# Patient Record
Sex: Female | Born: 1980 | Race: White | Hispanic: No | Marital: Married | Smoking: Former smoker
Health system: Southern US, Community
[De-identification: ages and names within clinical notes are randomized; demographics above are authoritative.]

## PROBLEM LIST (undated history)

## (undated) DIAGNOSIS — A599 Trichomoniasis, unspecified: Secondary | ICD-10-CM

## (undated) DIAGNOSIS — E119 Type 2 diabetes mellitus without complications: Secondary | ICD-10-CM

## (undated) DIAGNOSIS — E669 Obesity, unspecified: Secondary | ICD-10-CM

## (undated) HISTORY — PX: CHOLECYSTECTOMY: SHX55

## (undated) HISTORY — DX: Trichomoniasis, unspecified: A59.9

---
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
## 2001-02-20 ENCOUNTER — Other Ambulatory Visit: Admission: RE | Admit: 2001-02-20 | Discharge: 2001-02-20 | Payer: Self-pay | Admitting: Family Medicine

## 2001-02-21 ENCOUNTER — Other Ambulatory Visit: Admission: RE | Admit: 2001-02-21 | Discharge: 2001-02-21 | Payer: Self-pay | Admitting: Family Medicine

## 2001-12-08 ENCOUNTER — Emergency Department (HOSPITAL_COMMUNITY): Admission: EM | Admit: 2001-12-08 | Discharge: 2001-12-09 | Payer: Self-pay | Admitting: Emergency Medicine

## 2001-12-09 ENCOUNTER — Encounter: Payer: Self-pay | Admitting: Emergency Medicine

## 2002-06-01 ENCOUNTER — Emergency Department (HOSPITAL_COMMUNITY): Admission: EM | Admit: 2002-06-01 | Discharge: 2002-06-02 | Payer: Self-pay | Admitting: Emergency Medicine

## 2006-07-18 ENCOUNTER — Inpatient Hospital Stay (HOSPITAL_COMMUNITY): Admission: AD | Admit: 2006-07-18 | Discharge: 2006-07-18 | Payer: Self-pay | Admitting: Obstetrics and Gynecology

## 2006-08-30 ENCOUNTER — Ambulatory Visit: Payer: Self-pay | Admitting: Obstetrics & Gynecology

## 2006-08-30 ENCOUNTER — Inpatient Hospital Stay (HOSPITAL_COMMUNITY): Admission: AD | Admit: 2006-08-30 | Discharge: 2006-08-30 | Payer: Self-pay | Admitting: Obstetrics & Gynecology

## 2006-09-02 ENCOUNTER — Inpatient Hospital Stay (HOSPITAL_COMMUNITY): Admission: AD | Admit: 2006-09-02 | Discharge: 2006-09-05 | Payer: Self-pay | Admitting: Obstetrics and Gynecology

## 2009-08-04 ENCOUNTER — Other Ambulatory Visit: Admission: RE | Admit: 2009-08-04 | Discharge: 2009-08-04 | Payer: Self-pay | Admitting: Obstetrics and Gynecology

## 2010-05-30 NOTE — H&P (Signed)
Samantha Blackburn, Samantha Blackburn               ACCOUNT NO.:  [id number]   MEDICAL RECORD NO.:  [id number]          PATIENT TYPE:  MAT   LOCATION:  [facility]                          FACILITY:  [facility]   PHYSICIAN:  Tilda Burrow, M.D. DATE OF BIRTH:  Oct 19, 1980   DATE OF ADMISSION:  08/30/2006  DATE OF DISCHARGE:  08/30/2006                              HISTORY & PHYSICAL   ADMITTING DIAGNOSES:  Pregnancy [redacted] weeks gestation, cervical  favorability, prodromal labor symptoms.   HPI:  This is a 30 year old gravida 2, para 1 due August 26 is admitted  after being seen in our office with cervical changes, cervix 3 cm,  anterior, relatively thick, the vertex was well applied.  She has been  having, as she describes it, a miserable backache off and on all day and  had palpable contractions while she was here.  Membranes are intact.  Prenatal course is notable for 16 prenatal visits, appropriate weight  gain, appropriate fundal height throughout, weight has stayed  essentially unchanged throughout the pregnancy but has not been a  concern.  __________  blood type A positive urine drug screen negative,  varicella immunity present, antibody screen negative, rubella immunity  present.  Hemoglobin 13, hematocrit 38, hepatitis and HIV, RPR, GC  chlamydia all negative.  Pap smear normal.  Chlamydia test initially  positive.  Repeated as negative after treatment of patient and partner.  Glucose tolerance test 180 mg at 1 hour with a normal 3 hour test.  She  showed no glucosuria other than once at 36 weeks when her blood sugar  was 136.  ___GBS_______ is negative.  She desires epidural.  Plans to  bottle feed.   _Anticipate_ vaginal delivery  P Ex: blood pressure 110/54, weight 203 which is a 1 pound weight gain  this pregnancy.  Fundal height 39 cm, fetal heart rate 145, vertex.  Cervix 3cm/ long/ anterior cx/ VTXat -1   PLAN:  Pitocin, amniotomy, epidural tonight September 02, 2006.      Tilda Burrow,  M.D.  Electronically Signed     JVF/MEDQ  D:  09/02/2006  T:  09/02/2006  Job:  [id number]

## 2010-05-30 NOTE — Consult Note (Signed)
NAME:  Samantha Blackburn, Samantha Blackburn               ACCOUNT NO.:  [id number]   MEDICAL RECORD NO.:  [id number]          PATIENT TYPE:  MAT   LOCATION:  [facility]                          [facility]ACILITY:  [facility]   PHYSICIAN:  Lazaro Arms, M.D.   DATE OF BIRTH:  1980/05/19   DATE OF CONSULTATION:  07/18/2006  DATE OF DISCHARGE:                                 CONSULTATION   [name] is a 30 year old white female, gravida 2, para 1, estimated date  of confinement September 10, 2006, at [redacted] weeks gestation who presented to  MAU at [facility] complaining of leakage of fluid and spotting.  She states that she has been having spotting for some time, off and on.  When she got up to use the bathroom at work, she had some fluid in her  panties.  She had no big gush going down her leg or anything of that  nature.  She denies any contractions and reports good fetal movement.  In the MAU, she has a reactive NST, very active fetus, no uterine  activity to speak of.  Nurse's assessment she was dry and amnioswab was  negative.  I came and did a sterile speculum examination and had her  cough vigorously in Valsalva and there was no fluid seen, no pooling at  all.  Amnioswab was negative.  She does have a bacterial vaginosis  infection.  Her cervix is tight, fingertip, thick, and -3, and vertex.   She is discharged from the MAU and given a prescription for  Metronidazole 500 mg take one twice a day for the next 7 days and she  will keep her regularly scheduled appointment.  There is certainly no  evidence of any ruptured of membranes at all.  She is given routine  labor and OB precautions.      Lazaro Arms, M.D.  Electronically Signed     LHE/MEDQ  D:  07/18/2006  T:  07/18/2006  Job:  [id number]

## 2010-10-27 LAB — CBC
HCT: 28.9 — ABNORMAL LOW
Hemoglobin: 10.2 — ABNORMAL LOW
Hemoglobin: 9.1 — ABNORMAL LOW
MCHC: 35.1
MCHC: 35.4
MCV: 86
MCV: 87.6
Platelets: 202
RBC: 2.98 — ABNORMAL LOW
RBC: 3.36 — ABNORMAL LOW
RDW: 13.9
WBC: 7.4
WBC: 9.4

## 2010-10-27 LAB — BASIC METABOLIC PANEL
BUN: <1 — ABNORMAL LOW
CO2: 25
Calcium: 8 — ABNORMAL LOW
Chloride: 108
Creatinine, Ser: 0.43
GFR calc Af Amer: >60
GFR calc non Af Amer: >60
Glucose, Bld: 90
Potassium: 2.5 — CL
Sodium: 139

## 2012-09-16 ENCOUNTER — Encounter: Payer: Self-pay | Admitting: Obstetrics and Gynecology

## 2012-10-02 ENCOUNTER — Encounter: Payer: Self-pay | Admitting: Advanced Practice Midwife

## 2012-10-07 ENCOUNTER — Encounter: Payer: Self-pay | Admitting: Women's Health

## 2012-10-07 ENCOUNTER — Ambulatory Visit (INDEPENDENT_AMBULATORY_CARE_PROVIDER_SITE_OTHER): Payer: Medicaid Other | Admitting: Women's Health

## 2012-10-07 ENCOUNTER — Other Ambulatory Visit (HOSPITAL_COMMUNITY)
Admission: RE | Admit: 2012-10-07 | Discharge: 2012-10-07 | Disposition: A | Discharge status: Home / Self Care | Payer: Medicaid Other | Source: Ambulatory Visit | Attending: Obstetrics & Gynecology | Admitting: Obstetrics & Gynecology

## 2012-10-07 VITALS — BP 112/70 | Ht 64.0 in | Wt 205.0 lb

## 2012-10-07 DIAGNOSIS — Z01419 Encounter for gynecological examination (general) (routine) without abnormal findings: Secondary | ICD-10-CM | POA: Insufficient documentation

## 2012-10-07 DIAGNOSIS — Z0000 Encounter for general adult medical examination without abnormal findings: Secondary | ICD-10-CM

## 2012-10-07 DIAGNOSIS — Z1151 Encounter for screening for human papillomavirus (HPV): Secondary | ICD-10-CM | POA: Insufficient documentation

## 2012-10-07 NOTE — Patient Instructions (Signed)

## 2012-10-07 NOTE — Progress Notes (Signed)
Patient ID: Christain Sacramento, female   DOB: [birth year], 32 y.o.   MRN: [id number] Subjective:     MARCELE KOSTA is a 32 y.o.  Caucasian G31P2002 female here for a routine well-woman exam.  No LMP recorded. Patient has had an implant.  Current complaints: stress UI Smoking Status: quit 4 years ago! Does not desire labs, states she gets these done at [facility]  Personal health questionnaire reviewed: yes.   Gynecologic History No LMP recorded. Patient has had an implant. Contraception: Nexplanon and is coming Tues to have it removed and new one inserted Last Pap: 59yrs ago. Results were: normal Last mammogram: never. Results were: n/a  Obstetric History OB History  No data available     The following portions of the patient's history were reviewed and updated as appropriate: allergies, current medications, past family history, past medical history, past social history, past surgical history and problem list.  Review of Systems  Review of Systems  Constitutional: Negative for fever, chills, weight loss, malaise/fatigue and diaphoresis.  HENT: Negative for hearing loss, ear pain, nosebleeds, congestion, sore throat, neck       pain, tinnitus and ear discharge.   Eyes: Negative for blurred vision, double vision, photophobia, pain, discharge and       redness.  Respiratory: Negative for cough, hemoptysis, sputum production, shortness of breath,       wheezing and stridor.   Cardiovascular: Negative for chest pain, palpitations, orthopnea, claudication, leg       swelling and PND.  Gastrointestinal: negative for abdominal pain. Negative for heartburn, nausea, vomiting,       diarrhea, constipation, blood in stool and melena.  Genitourinary: Negative for dysuria, urgency, frequency, hematuria, flank pain,      and dyspareunia. Some stress UI Musculoskeletal: Negative for myalgias, back pain, joint pain and falls.  Skin: Negative for itching and rash.  Neurological: Negative for dizziness,  tingling, tremors, sensory change, speech change,     focal weakness, seizures, loss of consciousness, weakness and headaches.  Endo/Heme/Allergies: Negative for environmental allergies and polydipsia. Does not       bruise/bleed easily.  Psychiatric/Behavioral: Negative for depression, suicidal ideas, hallucinations, memory       loss and substance abuse. The patient is not nervous/anxious and does not have       insomnia.        Objective:     Physical Exam  BP 112/70  Ht 5\' 4"  (1.626 m)  Wt 205 lb (92.987 kg)  BMI 35.17 kg/m2 Constitutional: She is oriented to person, place, and time. She appears well-developed    and well-nourished.  HEENT:     Head: Normocephalic and atraumatic.           Right Ear: External ear normal.     Left Ear: External ear normal.     Nose: Nose normal.     Mouth/Throat: Oropharynx is clear and moist.     Eyes: Conjunctivae and EOM are normal. Pupils are equal, round, and reactive to light.    Right eye exhibits no discharge. Left eye exhibits no discharge. No scleral icterus.  Neck: Normal range of motion. Neck supple. No tracheal deviation present. No          thyromegaly present.  Cardiovascular: Normal rate, regular rhythm, normal heart sounds and intact distal          pulses.  Exam reveals no gallop and no friction rub.  No murmur heard. Respiratory: Effort normal and breath sounds  normal. No respiratory distress. She has       no wheezes. She has no rales. She exhibits no tenderness.  Breasts: no dominate palpable mass, retraction or nipple discharge  GI: Soft. Bowel sounds are normal. She exhibits no distension and no mass. There is no    tenderness. There is no rebound and no guarding.     Hemoccult: n/a Genitourinary:     Vulva is normal without lesions    Vagina is pink moist without discharge    Cervix normal in appearance and thin prep pap is done w/       HPV cotesting    Uterus is normal size shape and contour    Adnexa is negative  with normal sized ovaries   Musculoskeletal: Normal range of motion. She exhibits no edema and no tenderness.  Neurological: She is alert and oriented to person, place, and time. She has normal    reflexes. She displays normal reflexes. No cranial nerve deficit. She exhibits normal    muscle tone. Coordination normal.  Skin: Skin is warm and dry. No rash noted. No erythema. No pallor.  Psychiatric: She has a normal mood and affect. Her behavior is normal. Judgment and    thought content normal.       Assessment:     Healthy well-woman exam.     Plan:   F/U Tues for Implanon removal and nexplanon insertion, then 30yr for physical Recommended kegels daily- educated on how to perform them Mammogram @ 32yo or sooner if problems Colonoscopy @32yo  or sooner if problems  Cheral Marker, CNM, [facility] 10/07/2012 3:40 PM

## 2012-10-14 ENCOUNTER — Ambulatory Visit (INDEPENDENT_AMBULATORY_CARE_PROVIDER_SITE_OTHER): Payer: Medicaid Other | Admitting: Advanced Practice Midwife

## 2012-10-14 ENCOUNTER — Encounter: Payer: Self-pay | Admitting: Advanced Practice Midwife

## 2012-10-14 VITALS — BP 120/80 | Ht 64.0 in | Wt 205.0 lb

## 2012-10-14 DIAGNOSIS — Z3046 Encounter for surveillance of implantable subdermal contraceptive: Secondary | ICD-10-CM

## 2012-10-14 DIAGNOSIS — Z975 Presence of (intrauterine) contraceptive device: Secondary | ICD-10-CM

## 2012-10-14 DIAGNOSIS — Z30017 Encounter for initial prescription of implantable subdermal contraceptive: Secondary | ICD-10-CM

## 2012-10-14 NOTE — Progress Notes (Signed)
Samantha Blackburn was given informed consent for removal of her Implanon, time out was performed.  Signed copy in the chart.  Appropriate time out taken. Implanon site identified.  Area prepped in usual sterile fashon. 2 cc of 1% lidocaine was used to anesthetize the area starting with the distal end of the implant. A small stab incision was made right beside the implant on the distal portion.  The implanon rod was grasped using hemostats and removed without difficulty.  There was less than 3 cc blood loss. There were no complications. Next, the area was cleansed again and the new Nexplanon was inserted without difficulty.  Steri strips and a pressure bandage were applied.  Pt was instructed to remove pressure bandage in a few hours, and keep insertion site covered with a bandaid for 3 days.  Follow-up scheduled PRN problems  CRESENZO-DISHMAN,Samantha Blackburn 10/14/2012 4:25 PM

## 2012-10-30 ENCOUNTER — Telehealth: Payer: Self-pay | Admitting: Advanced Practice Midwife

## 2012-10-30 MED ORDER — MEGESTROL ACETATE 40 MG PO TABS: ORAL_TABLET | ORAL | Status: DC

## 2012-10-30 NOTE — Telephone Encounter (Signed)
Spoke with pt. Got Nexplanon 2 weeks ago. Having a problem with bleeding. Pt states Drenda Freeze advised if she needs med for bleeding to call us. Can you order med? Uses [facility] in [place]. Thanks!!!

## 2012-10-30 NOTE — Telephone Encounter (Signed)
Left message letting pt know Megace had been e prescribed to pharmacy. JSY

## 2012-11-29 ENCOUNTER — Encounter (HOSPITAL_COMMUNITY): Payer: Self-pay | Admitting: Emergency Medicine

## 2012-11-29 ENCOUNTER — Observation Stay (HOSPITAL_COMMUNITY)
Admission: EM | Admit: 2012-11-29 | Discharge: 2012-12-01 | Disposition: A | Discharge status: Home / Self Care | Payer: Medicaid Other | Attending: Internal Medicine | Admitting: Internal Medicine

## 2012-11-29 DIAGNOSIS — Z8632 Personal history of gestational diabetes: Secondary | ICD-10-CM | POA: Diagnosis present

## 2012-11-29 DIAGNOSIS — E119 Type 2 diabetes mellitus without complications: Principal | ICD-10-CM | POA: Diagnosis present

## 2012-11-29 DIAGNOSIS — R3589 Other polyuria: Secondary | ICD-10-CM | POA: Diagnosis present

## 2012-11-29 DIAGNOSIS — R358 Other polyuria: Secondary | ICD-10-CM | POA: Diagnosis present

## 2012-11-29 DIAGNOSIS — Z975 Presence of (intrauterine) contraceptive device: Secondary | ICD-10-CM

## 2012-11-29 DIAGNOSIS — R631 Polydipsia: Secondary | ICD-10-CM | POA: Diagnosis present

## 2012-11-29 DIAGNOSIS — R739 Hyperglycemia, unspecified: Secondary | ICD-10-CM

## 2012-11-29 DIAGNOSIS — E669 Obesity, unspecified: Secondary | ICD-10-CM | POA: Diagnosis present

## 2012-11-29 HISTORY — DX: Obesity, unspecified: E66.9

## 2012-11-29 HISTORY — DX: Type 2 diabetes mellitus without complications: E11.9

## 2012-11-29 LAB — BASIC METABOLIC PANEL
CO2: 22 mEq/L (ref 19–32)
Calcium: 9 mg/dL (ref 8.4–10.5)
Chloride: 102 mEq/L (ref 96–112)
Glucose, Bld: 551 mg/dL (ref 70–99)
Potassium: 3.8 mEq/L (ref 3.5–5.1)
Sodium: 134 mEq/L — ABNORMAL LOW (ref 135–145)

## 2012-11-29 LAB — URINALYSIS, ROUTINE W REFLEX MICROSCOPIC
Leukocytes, UA: NEGATIVE
Protein, ur: NEGATIVE mg/dL
Specific Gravity, Urine: <1.005 — ABNORMAL LOW (ref 1.005–1.030)
Urobilinogen, UA: 0.2 mg/dL (ref 0.0–1.0)

## 2012-11-29 LAB — CBC WITH DIFFERENTIAL/PLATELET
Basophils Absolute: 0 10*3/uL (ref 0.0–0.1)
Lymphocytes Relative: 35 % (ref 12–46)
Lymphs Abs: 2.1 10*3/uL (ref 0.7–4.0)
Neutro Abs: 3.4 10*3/uL (ref 1.7–7.7)
Neutrophils Relative %: 58 % (ref 43–77)
Platelets: 170 10*3/uL (ref 150–400)
RBC: 4.33 MIL/uL (ref 3.87–5.11)
RDW: 13.5 % (ref 11.5–15.5)
WBC: 5.9 10*3/uL (ref 4.0–10.5)

## 2012-11-29 LAB — BLOOD GAS, VENOUS
Acid-base deficit: 3.8 mmol/L — ABNORMAL HIGH (ref 0.0–2.0)
Bicarbonate: 20.9 mEq/L (ref 20.0–24.0)
FIO2: 0.21 %
O2 Saturation: 63.8 %
Patient temperature: 37
pO2, Ven: 36.2 mmHg (ref 30.0–45.0)

## 2012-11-29 LAB — GLUCOSE, CAPILLARY
Glucose-Capillary: 515 mg/dL — ABNORMAL HIGH (ref 70–99)
Glucose-Capillary: 314 mg/dL — ABNORMAL HIGH (ref 70–99)

## 2012-11-29 MED ORDER — INSULIN ASPART 100 UNIT/ML ~~LOC~~ SOLN
0.0000 [IU] | Freq: Three times a day (TID) | SUBCUTANEOUS | Status: DC
  Administered 2012-11-30: 7 [IU] via SUBCUTANEOUS
  Administered 2012-11-30: 2 [IU] via SUBCUTANEOUS
  Administered 2012-11-30 – 2012-12-01 (×2): 3 [IU] via SUBCUTANEOUS

## 2012-11-29 MED ORDER — SODIUM CHLORIDE 0.9 % IV SOLN: INTRAVENOUS | Status: DC

## 2012-11-29 MED ORDER — DEXTROSE-NACL 5-0.45 % IV SOLN: INTRAVENOUS | Status: DC

## 2012-11-29 MED ORDER — DEXTROSE 50 % IV SOLN: 25.0000 mL | INTRAVENOUS | Status: DC | PRN

## 2012-11-29 MED ORDER — INSULIN REGULAR BOLUS VIA INFUSION
0.0000 [IU] | Freq: Three times a day (TID) | INTRAVENOUS | Status: DC
  Filled 2012-11-29: qty 10

## 2012-11-29 MED ORDER — INSULIN GLARGINE 100 UNIT/ML ~~LOC~~ SOLN
10.0000 [IU] | Freq: Every day | SUBCUTANEOUS | Status: DC
  Administered 2012-11-29 – 2012-11-30 (×2): 10 [IU] via SUBCUTANEOUS
  Filled 2012-11-29 (×3): qty 0.1

## 2012-11-29 MED ORDER — SODIUM CHLORIDE 0.9 % IV SOLN: INTRAVENOUS | Status: AC

## 2012-11-29 MED ORDER — ASPIRIN EC 81 MG PO TBEC
81.0000 mg | DELAYED_RELEASE_TABLET | Freq: Every day | ORAL | Status: DC
  Administered 2012-11-30 – 2012-12-01 (×2): 81 mg via ORAL
  Filled 2012-11-29 (×2): qty 1

## 2012-11-29 MED ORDER — SODIUM CHLORIDE 0.9 % IV BOLUS (SEPSIS)
2000.0000 mL | Freq: Once | INTRAVENOUS | Status: AC
  Administered 2012-11-29: 2000 mL via INTRAVENOUS

## 2012-11-29 MED ORDER — SODIUM CHLORIDE 0.9 % IV SOLN
INTRAVENOUS | Status: DC
  Administered 2012-11-29: 3.4 [IU]/h via INTRAVENOUS
  Filled 2012-11-29: qty 1

## 2012-11-29 MED ORDER — INSULIN ASPART 100 UNIT/ML ~~LOC~~ SOLN
10.0000 [IU] | Freq: Once | SUBCUTANEOUS | Status: AC
  Administered 2012-11-29: 10 [IU] via SUBCUTANEOUS
  Filled 2012-11-29: qty 1

## 2012-11-29 MED ORDER — SODIUM CHLORIDE 0.9 % IV SOLN
INTRAVENOUS | Status: DC
  Administered 2012-11-29: 1000 mL via INTRAVENOUS

## 2012-11-29 NOTE — ED Notes (Addendum)
CRITICAL VALUE ALERT  Critical value received:  Glucose 551  Date of notification:  11/29/12  Time of notification:  2156  Critical value read back:yes  Nurse who received alert:  Kathlene Cote, RN  MD notified (1st page):  2156  Time of first page:  2156  Responding MD:  Dr. Clarene Duke  Time MD responded:  2156

## 2012-11-29 NOTE — H&P (Signed)
Chief Complaint:  Peeing a lot and thirsty  HPI: 32 yo female with h/o gestational dm, prediabetic diagnosis 2 years ago was briefly on metformin and then taken off told she didn't need it anymore comes in after several days of feeling very thirsty, and urinating a lot.  No fevers.  No n/v.  No cough,  No dysuria.  No recent illnesses.  Her mother is a diabetic, and she checked her glucose at her moms and it was noted too high to read.  She then came to ED for further evaluation.    Review of Systems:  Positive and negative as per HPI otherwise all other systems are negative  Past Medical History: History reviewed. No pertinent past medical history. Past Surgical History  Procedure Laterality Date  . Cholecystectomy      Medications: Prior to Admission medications   Medication Sig Start Date End Date Taking? Authorizing Provider  etonogestrel (IMPLANON) 68 MG IMPL implant Inject 1 each into the skin once.    Historical Provider, MD    Allergies:  No Known Allergies  Social History:  reports that she has quit smoking. She has never used smokeless tobacco. She reports that she does not drink alcohol or use illicit drugs.  Family History: Family History  Problem Relation Age of Onset  . Hypertension Mother   . Diabetes Mother   . Cancer Father     lymphoma, kidney,bladder, liver  . Diabetes Father   . Heart attack Father   . Diabetes Maternal Grandmother   . Heart attack Maternal Grandfather   . Stroke Paternal Grandmother     Physical Exam: Filed Vitals:   11/29/12 2041  BP: 134/82  Pulse: 92  Temp: 98.2 F (36.8 C)  TempSrc: Oral  Resp: 20  Height: 5\' 4"  (1.626 m)  Weight: 84.369 kg (186 lb)  SpO2: 100%   General appearance: alert, cooperative and no distress Head: Normocephalic, without obvious abnormality, atraumatic Eyes: negative Nose: Nares normal. Septum midline. Mucosa normal. No drainage or sinus tenderness. Neck: no JVD and supple, symmetrical,  trachea midline Lungs: clear to auscultation bilaterally Heart: regular rate and rhythm, S1, S2 normal, no murmur, click, rub or gallop Abdomen: soft, non-tender; bowel sounds normal; no masses,  no organomegaly Extremities: extremities normal, atraumatic, no cyanosis or edema Pulses: 2+ and symmetric Skin: Skin color, texture, turgor normal. No rashes or lesions Neurologic: Grossly normal    Labs on Admission:   Recent Labs  11/29/12 2116  NA 134*  K 3.8  CL 102  CO2 22  GLUCOSE 551*  BUN 12  CREATININE 0.60  CALCIUM 9.0    Recent Labs  11/29/12 2116  WBC 5.9  NEUTROABS 3.4  HGB 12.2  HCT 35.9*  MCV 82.9  PLT 170   Radiological Exams on Admission: No results found.  Assessment/Plan  32 yo female with new onset ?? Diabetes  Principal Problem:   Diabetes mellitus, new onset- ck hga1c.  Glucose initially over 500 and has responded with ivf bolus nicely.  Will order 10 units of lantus now, and 10 units of novolog.  Ck glucose q 2 hours for several times tonight to ensure her glucose does not decline too quickly.  She will probably not need insulin long term hopefully, sounds like she responded to minimal oral medications 2 years ago.  Obs.    Active Problems:   Polydipsia   Polyuria   H/O gestational diabetes mellitus, not currently pregnant    Kristine Chahal A 11/29/2012, 10:45  PM

## 2012-11-29 NOTE — ED Provider Notes (Signed)
CSN: [id number]     Arrival date & time 11/29/12  1957 History   First MD Initiated Contact with Patient 11/29/12 2055     Chief Complaint  Patient presents with  . Hyperglycemia  . Headache  . dry mouth     HPI Pt was seen at 2125. Per pt, c/o gradual onset and worsening of persistent "high blood sugars" for the past 4 days. Pt states she has had "dry mouth," excessive thirst, excessive urination, and generalized fatigue for the past 4 days. Pt states her mother has hx of DM and took her CBG which read "high." Pt states she had similar symptoms "8 years ago" when she had gestational DM. At that time she states she was on insulin and "a pill," but cannot recall the name of it. Denies CP/palpitations, no SOB/cough, no abd pain, no N/V/D, no fevers.    History reviewed. No pertinent past medical history.  Past Surgical History  Procedure Laterality Date  . Cholecystectomy     Family History  Problem Relation Age of Onset  . Hypertension Mother   . Diabetes Mother   . Cancer Father     lymphoma, kidney,bladder, liver  . Diabetes Father   . Heart attack Father   . Diabetes Maternal Grandmother   . Heart attack Maternal Grandfather   . Stroke Paternal Grandmother    History  Substance Use Topics  . Smoking status: Former Games developer  . Smokeless tobacco: Never Used  . Alcohol Use: No   OB History   Grav Para Term Preterm Abortions TAB SAB Ect Mult Living   2 2 2       2      Review of Systems ROS: Statement: All systems negative except as marked or noted in the HPI; Constitutional: Negative for fever and chills. +generalized fatigue.; ; Eyes: Negative for eye pain, redness and discharge. ; ; ENMT: Negative for ear pain, hoarseness, nasal congestion, sinus pressure and sore throat. ; ; Cardiovascular: Negative for chest pain, palpitations, diaphoresis, dyspnea and peripheral edema. ; ; Respiratory: Negative for cough, wheezing and stridor. ; ; Gastrointestinal: +excessive thirst, "dry  mouth." Negative for nausea, vomiting, diarrhea, abdominal pain, blood in stool, hematemesis, jaundice and rectal bleeding. . ; ; Genitourinary: +urinary frequency. Negative for dysuria, flank pain and hematuria. ; ; Musculoskeletal: Negative for back pain and neck pain. Negative for swelling and trauma.; ; Skin: Negative for pruritus, rash, abrasions, blisters, bruising and skin lesion.; ; Neuro: Negative for headache, lightheadedness and neck stiffness. Negative for weakness, altered level of consciousness , altered mental status, extremity weakness, paresthesias, involuntary movement, seizure and syncope.     Allergies  Review of patient's allergies indicates no known allergies.  Home Medications   Current Outpatient Rx  Name  Route  Sig  Dispense  Refill  . etonogestrel (IMPLANON) 68 MG IMPL implant   Subcutaneous   Inject 1 each into the skin once.          BP 134/82  Pulse 92  Temp(Src) 98.2 F (36.8 C) (Oral)  Resp 20  Ht 5\' 4"  (1.626 m)  Wt 186 lb (84.369 kg)  BMI 31.91 kg/m2  SpO2 100% Physical Exam 2130: Physical examination:  Nursing notes reviewed; Vital signs and O2 SAT reviewed;  Constitutional: Well developed, Well nourished, Well hydrated, In no acute distress; Head:  Normocephalic, atraumatic; Eyes: EOMI, PERRL, No scleral icterus; ENMT: Mouth and pharynx normal, Mucous membranes moist; Neck: Supple, Full range of motion, No lymphadenopathy; Cardiovascular: Regular  rate and rhythm, No gallop; Respiratory: Breath sounds clear & equal bilaterally, No wheezes.  Speaking full sentences with ease, Normal respiratory effort/excursion; Chest: Nontender, Movement normal; Abdomen: Soft, Nontender, Nondistended, Normal bowel sounds; Genitourinary: No CVA tenderness; Extremities: Pulses normal, No tenderness, No edema, No calf edema or asymmetry.; Neuro: AA&Ox3, Major CN grossly intact.  Speech clear. No gross focal motor or sensory deficits in extremities.; Skin: Color normal,  Warm, Dry.   ED Course  Procedures   EKG Interpretation     Ventricular Rate:  87 PR Interval:  168 QRS Duration: 72 QT Interval:  356 QTC Calculation: 428 R Axis:   50 Text Interpretation:  Normal sinus rhythm Normal ECG No previous ECGs available            MDM  MDM Reviewed: previous chart, nursing note and vitals Reviewed previous: labs and ECG Interpretation: labs and ECG   Results for orders placed during the hospital encounter of 11/29/12  GLUCOSE, CAPILLARY      Result Value Range   Glucose-Capillary 515 (*) 70 - 99 mg/dL   Comment 1 Documented in Chart     Comment 2 Notify RN    URINALYSIS, ROUTINE W REFLEX MICROSCOPIC      Result Value Range   Color, Urine YELLOW  YELLOW   APPearance CLEAR  CLEAR   Specific Gravity, Urine <1.005 (*) 1.005 - 1.030   pH 6.0  5.0 - 8.0   Glucose, UA >1000 (*) NEGATIVE mg/dL   Hgb urine dipstick TRACE (*) NEGATIVE   Bilirubin Urine NEGATIVE  NEGATIVE   Ketones, ur NEGATIVE  NEGATIVE mg/dL   Protein, ur NEGATIVE  NEGATIVE mg/dL   Urobilinogen, UA 0.2  0.0 - 1.0 mg/dL   Nitrite NEGATIVE  NEGATIVE   Leukocytes, UA NEGATIVE  NEGATIVE  CBC WITH DIFFERENTIAL      Result Value Range   WBC 5.9  4.0 - 10.5 K/uL   RBC 4.33  3.87 - 5.11 MIL/uL   Hemoglobin 12.2  12.0 - 15.0 g/dL   HCT 40.9 (*) 81.1 - 91.4 %   MCV 82.9  78.0 - 100.0 fL   MCH 28.2  26.0 - 34.0 pg   MCHC 34.0  30.0 - 36.0 g/dL   RDW 78.2  95.6 - 21.3 %   Platelets 170  150 - 400 K/uL   Neutrophils Relative % 58  43 - 77 %   Neutro Abs 3.4  1.7 - 7.7 K/uL   Lymphocytes Relative 35  12 - 46 %   Lymphs Abs 2.1  0.7 - 4.0 K/uL   Monocytes Relative 6  3 - 12 %   Monocytes Absolute 0.4  0.1 - 1.0 K/uL   Eosinophils Relative 1  0 - 5 %   Eosinophils Absolute 0.0  0.0 - 0.7 K/uL   Basophils Relative 0  0 - 1 %   Basophils Absolute 0.0  0.0 - 0.1 K/uL  BASIC METABOLIC PANEL      Result Value Range   Sodium 134 (*) 135 - 145 mEq/L   Potassium 3.8  3.5 - 5.1  mEq/L   Chloride 102  96 - 112 mEq/L   CO2 22  19 - 32 mEq/L   Glucose, Bld 551 (*) 70 - 99 mg/dL   BUN 12  6 - 23 mg/dL   Creatinine, Ser 0.86  0.50 - 1.10 mg/dL   Calcium 9.0  8.4 - 57.8 mg/dL   GFR calc non Af Amer >90  >90  mL/min   GFR calc Af Amer >90  >90 mL/min  BLOOD GAS, VENOUS      Result Value Range   FIO2 0.21     Delivery systems ROOM AIR     pH, Ven 7.349 (*) 7.250 - 7.300   pCO2, Ven 38.9 (*) 45.0 - 50.0 mmHg   pO2, Ven 36.2  30.0 - 45.0 mmHg   Bicarbonate 20.9  20.0 - 24.0 mEq/L   TCO2 19.1  0 - 100 mmol/L   Acid-base deficit 3.8 (*) 0.0 - 2.0 mmol/L   O2 Saturation 63.8     Patient temperature 37.0     Collection site HAND     Drawn by COLLECTED BY NURSE     Sample type VEIN    URINE MICROSCOPIC-ADD ON      Result Value Range   RBC / HPF 0-2  <3 RBC/hpf  POCT PREGNANCY, URINE      Result Value Range   Preg Test, Ur NEGATIVE  NEGATIVE    2215:  Hyperglycemic on labs. AG 10. IVF NS 2L bolus given, will start IV insulin gtt. Dx and testing d/w pt and family.  Questions answered.  Verb understanding, agreeable to observation admit. T/C to [facility]. Onalee Hua, case discussed, including:  HPI, pertinent PM/SHx, VS/PE, dx testing, ED course and treatment:  Agreeable to observation admit, requests she will come to ED for eval.    Laray Anger, DO 11/30/12 1419

## 2012-11-29 NOTE — ED Notes (Addendum)
Pt states she was told 2 yrs that she had gestational diabetes. Pt states she hasn't had any problems in the last year. Pt c/o dry mouth, urinary frequency, fatigue, and high blood sugar levels.

## 2012-11-30 ENCOUNTER — Encounter (HOSPITAL_COMMUNITY): Payer: Self-pay

## 2012-11-30 DIAGNOSIS — Z8632 Personal history of gestational diabetes: Secondary | ICD-10-CM

## 2012-11-30 DIAGNOSIS — R358 Other polyuria: Secondary | ICD-10-CM

## 2012-11-30 DIAGNOSIS — R631 Polydipsia: Secondary | ICD-10-CM

## 2012-11-30 DIAGNOSIS — E119 Type 2 diabetes mellitus without complications: Secondary | ICD-10-CM

## 2012-11-30 DIAGNOSIS — R7309 Other abnormal glucose: Secondary | ICD-10-CM

## 2012-11-30 LAB — GLUCOSE, CAPILLARY
Glucose-Capillary: 193 mg/dL — ABNORMAL HIGH (ref 70–99)
Glucose-Capillary: 203 mg/dL — ABNORMAL HIGH (ref 70–99)
Glucose-Capillary: 213 mg/dL — ABNORMAL HIGH (ref 70–99)
Glucose-Capillary: 256 mg/dL — ABNORMAL HIGH (ref 70–99)
Glucose-Capillary: 287 mg/dL — ABNORMAL HIGH (ref 70–99)
Glucose-Capillary: 309 mg/dL — ABNORMAL HIGH (ref 70–99)

## 2012-11-30 LAB — T4, FREE: Free T4: 1.31 ng/dL (ref 0.80–1.80)

## 2012-11-30 LAB — BASIC METABOLIC PANEL
Calcium: 8.3 mg/dL — ABNORMAL LOW (ref 8.4–10.5)
Creatinine, Ser: 0.49 mg/dL — ABNORMAL LOW (ref 0.50–1.10)
GFR calc Af Amer: >90 mL/min (ref >90)
GFR calc non Af Amer: >90 mL/min (ref >90)
Glucose, Bld: 212 mg/dL — ABNORMAL HIGH (ref 70–99)
Potassium: 3.4 mEq/L — ABNORMAL LOW (ref 3.5–5.1)
Sodium: 138 mEq/L (ref 135–145)

## 2012-11-30 LAB — CBC
Hemoglobin: 11.3 g/dL — ABNORMAL LOW (ref 12.0–15.0)
MCH: 28 pg (ref 26.0–34.0)
Platelets: 175 10*3/uL (ref 150–400)
RBC: 4.03 MIL/uL (ref 3.87–5.11)
RDW: 13.5 % (ref 11.5–15.5)
WBC: 5 10*3/uL (ref 4.0–10.5)

## 2012-11-30 LAB — TSH: TSH: 3.112 u[IU]/mL (ref 0.350–4.500)

## 2012-11-30 MED ORDER — INSULIN GLARGINE 100 UNIT/ML ~~LOC~~ SOLN
12.0000 [IU] | Freq: Every day | SUBCUTANEOUS | Status: DC
  Administered 2012-11-30: 12 [IU] via SUBCUTANEOUS
  Filled 2012-11-30 (×2): qty 0.12

## 2012-11-30 MED ORDER — POTASSIUM CHLORIDE CRYS ER 20 MEQ PO TBCR
20.0000 meq | EXTENDED_RELEASE_TABLET | Freq: Two times a day (BID) | ORAL | Status: AC
  Administered 2012-11-30 (×2): 20 meq via ORAL
  Filled 2012-11-30 (×2): qty 1

## 2012-11-30 MED ORDER — ACETAMINOPHEN 325 MG PO TABS
650.0000 mg | ORAL_TABLET | Freq: Once | ORAL | Status: AC
  Administered 2012-11-30: 650 mg via ORAL
  Filled 2012-11-30: qty 2

## 2012-11-30 MED ORDER — METFORMIN HCL 500 MG PO TABS
500.0000 mg | ORAL_TABLET | Freq: Two times a day (BID) | ORAL | Status: DC
  Administered 2012-11-30 – 2012-12-01 (×2): 500 mg via ORAL
  Filled 2012-11-30 (×2): qty 1

## 2012-11-30 MED ORDER — ACETAMINOPHEN 325 MG PO TABS
650.0000 mg | ORAL_TABLET | Freq: Four times a day (QID) | ORAL | Status: DC | PRN
  Administered 2012-11-30: 650 mg via ORAL
  Filled 2012-11-30: qty 2

## 2012-11-30 NOTE — Plan of Care (Signed)
Problem: Consults Goal: Diagnosis-Diabetes Mellitus Outcome: Progressing New Onset Type II      

## 2012-11-30 NOTE — Progress Notes (Signed)
The patient is a 32 year old woman who was admitted this morning for newly diagnosed diabetes. She did have gestational diabetes and is familiar with blood glucose monitoring and diet. Agree with management as started. She was briefly seen. Her vital signs and laboratory studies were reviewed. We'll start metformin twice a day. We'll continue sliding scale NovoLog and Lantus. Will supplement her potassium chloride orally. Will assess her hemoglobin A1c and thyroid function. We'll monitor her blood glucose throughout the day and decide on discharge medications tomorrow.

## 2012-12-01 ENCOUNTER — Encounter (HOSPITAL_COMMUNITY): Payer: Self-pay | Admitting: Internal Medicine

## 2012-12-01 DIAGNOSIS — E669 Obesity, unspecified: Secondary | ICD-10-CM

## 2012-12-01 HISTORY — DX: Obesity, unspecified: E66.9

## 2012-12-01 LAB — BASIC METABOLIC PANEL
CO2: 21 mEq/L (ref 19–32)
Calcium: 8.9 mg/dL (ref 8.4–10.5)
Creatinine, Ser: 0.48 mg/dL — ABNORMAL LOW (ref 0.50–1.10)
GFR calc non Af Amer: >90 mL/min (ref >90)
Potassium: 3.6 mEq/L (ref 3.5–5.1)
Sodium: 137 mEq/L (ref 135–145)

## 2012-12-01 LAB — CBC
HCT: 35.5 % — ABNORMAL LOW (ref 36.0–46.0)
MCV: 82.6 fL (ref 78.0–100.0)
Platelets: 174 10*3/uL (ref 150–400)
RBC: 4.3 MIL/uL (ref 3.87–5.11)
RDW: 13.5 % (ref 11.5–15.5)
WBC: 5.6 10*3/uL (ref 4.0–10.5)

## 2012-12-01 LAB — HEMOGLOBIN A1C
Hgb A1c MFr Bld: 10.5 % — ABNORMAL HIGH (ref <5.7)
Mean Plasma Glucose: 255 mg/dL — ABNORMAL HIGH (ref <117)

## 2012-12-01 LAB — GLUCOSE, CAPILLARY: Glucose-Capillary: 212 mg/dL — ABNORMAL HIGH (ref 70–99)

## 2012-12-01 MED ORDER — INSULIN GLARGINE 100 UNIT/ML ~~LOC~~ SOLN: 15.0000 [IU] | Freq: Every day | SUBCUTANEOUS | Status: DC

## 2012-12-01 MED ORDER — METFORMIN HCL 500 MG PO TABS: 500.0000 mg | ORAL_TABLET | Freq: Two times a day (BID) | ORAL | Status: DC

## 2012-12-01 MED ORDER — LIVING WELL WITH DIABETES BOOK
Freq: Once | Status: AC
  Administered 2012-12-01: 11:00:00
  Filled 2012-12-01: qty 1

## 2012-12-01 NOTE — Progress Notes (Signed)
Pt verbalizes understanding of d/c instructions, medications and follow up appts. No questions at this time. Pt is able to draw up and administer insulin herself. Pt received Living Well with Diabetes Book. Diabetes  Coordinator was consulted and spent time at the bedside teaching pt. She has hx of gestational diabetes so is very familiar and comfortable with diabetes management. IV d/c without complication. Pt d/c accompanied by staff member. Sheryn Bison

## 2012-12-01 NOTE — Discharge Summary (Signed)
Physician Discharge Summary  Samantha Blackburn ZOX:[id number] DOB: December 05, 1980 DOA: 11/29/2012  PCP: Pcp Not In System  Admit date: 11/29/2012 Discharge date: 12/01/2012  Time spent: Greater than 30 minutes  Recommendations for Outpatient Follow-up:  1.   Discharge Diagnoses:  Principal Problem:   Diabetes mellitus, new onset Active Problems:   Polydipsia   Polyuria   H/O gestational diabetes mellitus, not currently pregnant   Obesity   Discharge Condition: Improved.  Diet recommendation: Carbohydrate modified.  Filed Weights   11/29/12 2041 11/30/12 0019  Weight: 84.369 kg (186 lb) 93.214 kg (205 lb 8 oz)    History of present illness:  The patient is a 32 year old woman with a history of gestational diabetes, treated with metformin, who presented to the emergency department on 11/29/2012 with a chief complaint of feeling thirsty and urinating a lot. Her mother is a diabetic and she decided to check her blood sugar at home. The glucometer read that it was too high. When she presented to the emergency department, she was afebrile and hemodynamically stable. Her capillary blood glucose was 515. Her venous gas revealed a pH of 7.34 and a PCO2 of 39. Her bicarbonate was within normal limits at 22. Her serum sodium was slightly low 134. She was admitted for further evaluation and management.  Hospital Course:  The patient was started on vigorous IV fluids. Sliding scale NovoLog and Lantus were ordered. Supportive treatment was given. The following morning, not surprisingly, her serum potassium decreased. She was supplemented orally. Since she had taken metformin in the past, twice a day dosing of metformin was started. She was familiar with diabetes and did not feel as if she needed additional education or teaching. For further evaluation, a number of studies were ordered. Her hemoglobin A1c was 10.5, indicating longer-term diabetes. Her thyroid function was within normal limits with a TSH  of 3.1 and free T4 of 1.3. Her followup serum potassium normalized at 3.6 and her followup serum sodium normalized to 137. Her capillary blood glucose decreased to the low 200s.  She remained hemodynamically stable and afebrile. She was discharged to home on twice a day dosing of metformin at bedtime Lantus. She was given a prescription for diabetes supplies as well. She was instructed to check her blood glucose at home once or twice daily. She was also encouraged to lose weight and to followup carbohydrate modified diet. She voiced understanding.  Procedures:  None  Consultations:  None  Discharge Exam: Filed Vitals:   12/01/12 0653  BP: 136/74  Pulse: 76  Temp: 98 F (36.7 C)  Resp: 20    General: Pleasant obese 32 year old Caucasian woman sitting up in bed, in no acute distress. Cardiovascular: S1, S2, with no murmurs rubs or gallops. Respiratory: Clear with mildly coarse breath sounds. Treating is nonlabored.  Discharge Instructions  Discharge Orders   Future Orders Complete By Expires   Diet Carb Modified  As directed    Discharge instructions  As directed    Comments:     Check your blood sugars once or twice daily.   Increase activity slowly  As directed        Medication List         IMPLANON 68 MG Impl implant  Generic drug:  etonogestrel  Inject 1 each into the skin once.     insulin glargine 100 UNIT/ML injection  Commonly known as:  LANTUS  Inject 0.15 mLs (15 Units total) into the skin at bedtime.  metFORMIN 500 MG tablet  Commonly known as:  GLUCOPHAGE  Take 1 tablet (500 mg total) by mouth 2 (two) times daily with a meal.       No Known Allergies     Follow-up Information   Please follow up. (Followup at the health Department as scheduled on December 3rd.)        The results of significant diagnostics from this hospitalization (including imaging, microbiology, ancillary and laboratory) are listed below for reference.    Significant  Diagnostic Studies: No results found.  Microbiology: No results found for this or any previous visit (from the past 240 hour(s)).   Labs: Basic Metabolic Panel:  Recent Labs Lab 11/29/12 2116 11/30/12 0615 12/01/12 0550  NA 134* 138 137  K 3.8 3.4* 3.6  CL 102 110 107  CO2 22 20 21   GLUCOSE 551* 212* 236*  BUN 12 8 9   CREATININE 0.60 0.49* 0.48*  CALCIUM 9.0 8.3* 8.9   Liver Function Tests: No results found for this basename: AST, ALT, ALKPHOS, BILITOT, PROT, ALBUMIN,  in the last 168 hours No results found for this basename: LIPASE, AMYLASE,  in the last 168 hours No results found for this basename: AMMONIA,  in the last 168 hours CBC:  Recent Labs Lab 11/29/12 2116 11/30/12 0615 12/01/12 0550  WBC 5.9 5.0 5.6  NEUTROABS 3.4  --   --   HGB 12.2 11.3* 12.1  HCT 35.9* 33.2* 35.5*  MCV 82.9 82.4 82.6  PLT 170 175 174   Cardiac Enzymes: No results found for this basename: CKTOTAL, CKMB, CKMBINDEX, TROPONINI,  in the last 168 hours BNP: BNP (last 3 results) No results found for this basename: PROBNP,  in the last 8760 hours CBG:  Recent Labs Lab 11/30/12 0810 11/30/12 1159 11/30/12 1619 11/30/12 2058 12/01/12 0754  GLUCAP 192* 203* 309* 256* 212*       Signed:  Breunna Nordmann  [facility] 12/01/2012, 7:29 PM

## 2012-12-01 NOTE — Progress Notes (Signed)
Spoke with patient about new diabetes diagnosis. Patient states that she had gestational diabetes and took Metformin and insulin for glycemic control during pregnancy. Discussed A1C results a(10.5% on 11/29/12) and explained what an A1C is, basic pathophysiology of DM Type 2, basic home care, importance of checking CBGs and maintaining good CBG control to prevent long-term and short-term complications. Reviewed signs and symptoms of hyperglycemia and hypoglycemia along with treatment for both.   Plan is for patient to discharge on Lantus and Metformin.  Patient reports that she has [employer] which will cover insulin pens.  Educated patient on insulin pen use at home. Reviewed all steps if insulin pen including attachment of needle, 2-unit air shot, dialing up dose, giving injection, removing needle, disposal of sharps, storage of unused insulin, disposal of insulin etc. Patient able to provide successful return demonstration. RNs to provide ongoing basic DM education at bedside with this patient and engage patient to actively check blood glucose and administer insulin injections. Have reviewed Living Well with Diabetes booklet with the patient and asked patient to watch patient education video on How to use an insulin pen (511).  Patient verbalized understanding of information discussed and states that she does not have any questions related to diabetes at this time.     At time of discharge:  Please have MD give patient Rxs for glucometer, insulin pens, and insulin pen needles.  Thanks, Orlando Penner, RN, MSN, CCRN Diabetes Coordinator [facility] [phone] (Team Pager) [phone] ([facility] office) [phone] [facility] office)

## 2013-04-01 ENCOUNTER — Emergency Department (HOSPITAL_COMMUNITY)
Admission: EM | Admit: 2013-04-01 | Discharge: 2013-04-01 | Discharge status: Against Medical Advice | Payer: Medicaid Other | Attending: Emergency Medicine | Admitting: Emergency Medicine

## 2013-04-01 ENCOUNTER — Encounter (HOSPITAL_COMMUNITY): Payer: Self-pay | Admitting: Emergency Medicine

## 2013-04-01 DIAGNOSIS — R109 Unspecified abdominal pain: Secondary | ICD-10-CM | POA: Insufficient documentation

## 2013-04-01 DIAGNOSIS — E119 Type 2 diabetes mellitus without complications: Secondary | ICD-10-CM | POA: Insufficient documentation

## 2013-04-01 DIAGNOSIS — E669 Obesity, unspecified: Secondary | ICD-10-CM | POA: Insufficient documentation

## 2013-04-01 HISTORY — DX: Type 2 diabetes mellitus without complications: E11.9

## 2013-04-01 NOTE — ED Notes (Signed)
No answer x 2 when called for room placement.

## 2013-04-01 NOTE — ED Notes (Signed)
No answer for room placement.

## 2013-04-01 NOTE — ED Notes (Signed)
Seen at urgent care yesterday for abdominal pain, states she has not improved and is actually worse

## 2013-04-01 NOTE — ED Notes (Signed)
No answer for room placement x 3.  Patient LWBS.

## 2013-04-01 NOTE — ED Notes (Signed)
No answer when called for room placement 

## 2013-11-16 ENCOUNTER — Encounter (HOSPITAL_COMMUNITY): Payer: Self-pay | Admitting: Emergency Medicine

## 2014-04-22 ENCOUNTER — Emergency Department (HOSPITAL_COMMUNITY)
Admission: EM | Admit: 2014-04-22 | Discharge: 2014-04-22 | Disposition: A | Discharge status: Home / Self Care | Payer: Medicaid Other | Attending: Emergency Medicine | Admitting: Emergency Medicine

## 2014-04-22 ENCOUNTER — Emergency Department (HOSPITAL_COMMUNITY): Payer: Medicaid Other

## 2014-04-22 ENCOUNTER — Encounter (HOSPITAL_COMMUNITY): Payer: Self-pay | Admitting: *Deleted

## 2014-04-22 DIAGNOSIS — Z79899 Other long term (current) drug therapy: Secondary | ICD-10-CM | POA: Insufficient documentation

## 2014-04-22 DIAGNOSIS — Z87891 Personal history of nicotine dependence: Secondary | ICD-10-CM | POA: Insufficient documentation

## 2014-04-22 DIAGNOSIS — J209 Acute bronchitis, unspecified: Secondary | ICD-10-CM | POA: Insufficient documentation

## 2014-04-22 DIAGNOSIS — E669 Obesity, unspecified: Secondary | ICD-10-CM | POA: Insufficient documentation

## 2014-04-22 DIAGNOSIS — R0989 Other specified symptoms and signs involving the circulatory and respiratory systems: Secondary | ICD-10-CM | POA: Insufficient documentation

## 2014-04-22 DIAGNOSIS — Z794 Long term (current) use of insulin: Secondary | ICD-10-CM | POA: Insufficient documentation

## 2014-04-22 DIAGNOSIS — E119 Type 2 diabetes mellitus without complications: Secondary | ICD-10-CM | POA: Insufficient documentation

## 2014-04-22 MED ORDER — PROMETHAZINE-CODEINE 6.25-10 MG/5ML PO SYRP
5.0000 mL | ORAL_SOLUTION | ORAL | Status: DC | PRN
Start: 2014-04-22 — End: 2015-04-11

## 2014-04-22 MED ORDER — BENZONATATE 200 MG PO CAPS: 200.0000 mg | ORAL_CAPSULE | Freq: Three times a day (TID) | ORAL | Status: DC | PRN

## 2014-04-22 MED ORDER — BENZONATATE 100 MG PO CAPS
200.0000 mg | ORAL_CAPSULE | Freq: Once | ORAL | Status: AC
  Administered 2014-04-22: 200 mg via ORAL
  Filled 2014-04-22: qty 2

## 2014-04-22 MED ORDER — ALBUTEROL SULFATE HFA 108 (90 BASE) MCG/ACT IN AERS
2.0000 | INHALATION_SPRAY | Freq: Once | RESPIRATORY_TRACT | Status: AC
  Administered 2014-04-22: 2 via RESPIRATORY_TRACT
  Filled 2014-04-22: qty 6.7

## 2014-04-22 NOTE — ED Notes (Signed)
Pt has had cough for 3 weeks with intermittent fevers. Pt denies diarrhea and has had 2 episodes of vomiting after she has a "coughing spell."  Pt has been taking Mucinex with no relief. Denies a productive cough. NAD noted at this time.

## 2014-04-22 NOTE — Discharge Instructions (Signed)
Acute Bronchitis Bronchitis is inflammation of the airways that extend from the windpipe into the lungs (bronchi). The inflammation often causes mucus to develop. This leads to a cough, which is the most common symptom of bronchitis.  In acute bronchitis, the condition usually develops suddenly and goes away over time, usually in a couple weeks. Smoking, allergies, and asthma can make bronchitis worse. Repeated episodes of bronchitis may cause further lung problems.  CAUSES Acute bronchitis is most often caused by the same virus that causes a cold. The virus can spread from person to person (contagious) through coughing, sneezing, and touching contaminated objects. SIGNS AND SYMPTOMS   Cough.   Fever.   Coughing up mucus.   Body aches.   Chest congestion.   Chills.   Shortness of breath.   Sore throat.  DIAGNOSIS  Acute bronchitis is usually diagnosed through a physical exam. Your health care provider will also ask you questions about your medical history. Tests, such as chest X-rays, are sometimes done to rule out other conditions.  TREATMENT  Acute bronchitis usually goes away in a couple weeks. Oftentimes, no medical treatment is necessary. Medicines are sometimes given for relief of fever or cough. Antibiotic medicines are usually not needed but may be prescribed in certain situations. In some cases, an inhaler may be recommended to help reduce shortness of breath and control the cough. A cool mist vaporizer may also be used to help thin bronchial secretions and make it easier to clear the chest.  HOME CARE INSTRUCTIONS  Get plenty of rest.   Drink enough fluids to keep your urine clear or pale yellow (unless you have a medical condition that requires fluid restriction). Increasing fluids may help thin your respiratory secretions (sputum) and reduce chest congestion, and it will prevent dehydration.   Take medicines only as directed by your health care provider.  If  you were prescribed an antibiotic medicine, finish it all even if you start to feel better.  Avoid smoking and secondhand smoke. Exposure to cigarette smoke or irritating chemicals will make bronchitis worse. If you are a smoker, consider using nicotine gum or skin patches to help control withdrawal symptoms. Quitting smoking will help your lungs heal faster.   Reduce the chances of another bout of acute bronchitis by washing your hands frequently, avoiding people with cold symptoms, and trying not to touch your hands to your mouth, nose, or eyes.   Keep all follow-up visits as directed by your health care provider.  SEEK MEDICAL CARE IF: Your symptoms do not improve after 1 week of treatment.  SEEK IMMEDIATE MEDICAL CARE IF:  You develop an increased fever or chills.   You have chest pain.   You have severe shortness of breath.  You have bloody sputum.   You develop dehydration.  You faint or repeatedly feel like you are going to pass out.  You develop repeated vomiting.  You develop a severe headache. MAKE SURE YOU:   Understand these instructions.  Will watch your condition.  Will get help right away if you are not doing well or get worse. Document Released: 02/09/2004 Document Revised: 05/18/2013 Document Reviewed: 06/24/2012 [facility] Patient Information 2015 [place], [place]. This information is not intended to replace advice given to you by your health care provider. Make sure you discuss any questions you have with your health care provider.  Use the inhaler we gave you as instructed, 2 puffs every 4 hours if you are wheezing or coughing.  Use the [employer]  Perles for daytime cough symptoms.  You may use the cough syrup additionally, but use caution as this will make her drowsy.  Do not take this medication if you will be working or driving.  Rest and make sure you're drinking plenty of fluids.  Your chest x-ray is clear today.

## 2014-04-22 NOTE — ED Provider Notes (Signed)
CSN: [id number]     Arrival date & time 04/22/14  0754 History   First MD Initiated Contact with Patient 04/22/14 [phone]     Chief Complaint  Patient presents with  . Cough     (Consider location/radiation/quality/duration/timing/severity/associated sxs/prior Treatment) The history is provided by the patient.   Samantha Blackburn is a 34 y.o. female presenting with a 3 week history of cough, chest tightness and intermittent wheezing along with intermittent fever up to 100.5.  Her symptoms originally started with classic URI symptoms including nasal congestion, rhinorrhea and sore throat which have since resolved, but with this persistent cough.  She has had several episodes of post tussive emesis but otherwise denies nausea or vomiting.  She's been taking Mucinex along with increased fluid intake, has also tried other OTC allergy and cold formula medications without symptom relief.  She is a diabetic, stating her blood sugars are under good control, last CBG was 160 yesterday evening.  She is a former smoker.    Past Medical History  Diagnosis Date  . Obesity 12/01/2012  . Diabetes mellitus, new onset 11/29/2012  . Diabetes mellitus without complication    Past Surgical History  Procedure Laterality Date  . Cholecystectomy     Family History  Problem Relation Age of Onset  . Hypertension Mother   . Diabetes Mother   . Cancer Father     lymphoma, kidney,bladder, liver  . Diabetes Father   . Heart attack Father   . Diabetes Maternal Grandmother   . Heart attack Maternal Grandfather   . Stroke Paternal Grandmother    History  Substance Use Topics  . Smoking status: Former Research scientist (life sciences)  . Smokeless tobacco: Never Used  . Alcohol Use: No   OB History    Gravida Para Term Preterm AB TAB SAB Ectopic Multiple Living   2 2 2       2      Review of Systems  Constitutional: Positive for fever. Negative for chills.  HENT: Negative for congestion, ear pain, rhinorrhea, sinus pressure, sore  throat, trouble swallowing and voice change.   Eyes: Negative for discharge.  Respiratory: Positive for cough, choking, chest tightness and wheezing. Negative for shortness of breath and stridor.   Cardiovascular: Negative for chest pain.  Gastrointestinal: Negative for nausea, vomiting and abdominal pain.       Several episodes of post tussive emesis  Genitourinary: Negative.       Allergies  Review of patient's allergies indicates no known allergies.  Home Medications   Prior to Admission medications   Medication Sig Start Date End Date Taking? Authorizing Provider  etonogestrel (IMPLANON) 68 MG IMPL implant Inject 1 each into the skin once.   Yes Historical Provider, MD  insulin glargine (LANTUS) 100 UNIT/ML injection Inject 0.15 mLs (15 Units total) into the skin at bedtime. 12/01/12  Yes Rexene Alberts, MD  metFORMIN (GLUCOPHAGE) 500 MG tablet Take 1 tablet (500 mg total) by mouth 2 (two) times daily with a meal. 12/01/12  Yes Rexene Alberts, MD  benzonatate (TESSALON) 200 MG capsule Take 1 capsule (200 mg total) by mouth 3 (three) times daily as needed for cough. 04/22/14   Evalee Jefferson, PA-C  promethazine-codeine (PHENERGAN WITH CODEINE) 6.25-10 MG/5ML syrup Take 5 mLs by mouth every 4 (four) hours as needed for cough. 04/22/14   Evalee Jefferson, PA-C   BP 126/84 mmHg  Pulse 93  Temp(Src) 97.9 F (36.6 C) (Oral)  Resp 18  Ht 5\' 4"  (1.626 m)  Wt 178 lb (80.74 kg)  BMI 30.54 kg/m2  SpO2 100% Physical Exam  Constitutional: She is oriented to person, place, and time. She appears well-developed and well-nourished.  HENT:  Head: Normocephalic and atraumatic.  Right Ear: Tympanic membrane and ear canal normal.  Left Ear: Tympanic membrane and ear canal normal.  Nose: No mucosal edema or rhinorrhea.  Mouth/Throat: Uvula is midline, oropharynx is clear and moist and mucous membranes are normal. No oropharyngeal exudate, posterior oropharyngeal edema, posterior oropharyngeal erythema or  tonsillar abscesses.  Eyes: Conjunctivae are normal.  Cardiovascular: Normal rate, regular rhythm and normal heart sounds.   Pulmonary/Chest: Effort normal. No respiratory distress. She has decreased breath sounds. She has no wheezes. She has no rhonchi. She has no rales.  Decreased breath sounds throughout all fields.  Abdominal: Soft. There is no tenderness.  Musculoskeletal: Normal range of motion.  Neurological: She is alert and oriented to person, place, and time.  Skin: Skin is warm and dry. No rash noted.  Psychiatric: She has a normal mood and affect.    ED Course  Procedures (including critical care time) Labs Review Labs Reviewed - No data to display  Imaging Review Dg Chest 2 View  04/22/2014   CLINICAL DATA:  Cough for 3 weeks  EXAM: CHEST  2 VIEW  COMPARISON:  None.  FINDINGS: Cardiomediastinal silhouette is unremarkable. No acute infiltrate or pleural effusion. No pulmonary edema. Bony thorax is unremarkable.  IMPRESSION: No active cardiopulmonary disease.   Electronically Signed   By: Lahoma Crocker M.D.   On: 04/22/2014 08:41     EKG Interpretation None      MDM   Final diagnoses:  Acute bronchitis, unspecified organism    Patients labs and/or radiological studies were reviewed and considered during the medical decision making and disposition process.  Results were also discussed with patient.  Patient with symptoms suggesting acute bronchitis.  Chest x-ray was clear.  She was given an albuterol MDI for cough and wheeze symptoms.  Tessalon, Phenergan With Codeine for home and evening use if additional cough relief is required.  Rest, increase fluid intake.  The patient appears reasonably screened and/or stabilized for discharge and I doubt any other medical condition or other [facility] requiring further screening, evaluation, or treatment in the ED at this time prior to discharge.     Evalee Jefferson, PA-C 04/22/14 [place], MD 04/22/14 [phone]

## 2014-04-22 NOTE — ED Notes (Signed)
Julie Idol at bedside. 

## 2014-06-23 ENCOUNTER — Ambulatory Visit: Payer: Medicaid Other | Admitting: Advanced Practice Midwife

## 2014-06-24 ENCOUNTER — Ambulatory Visit: Payer: Medicaid Other | Admitting: Advanced Practice Midwife

## 2014-06-30 ENCOUNTER — Encounter: Payer: Self-pay | Admitting: Advanced Practice Midwife

## 2014-06-30 ENCOUNTER — Ambulatory Visit (INDEPENDENT_AMBULATORY_CARE_PROVIDER_SITE_OTHER): Payer: Medicaid Other | Admitting: Advanced Practice Midwife

## 2014-06-30 ENCOUNTER — Other Ambulatory Visit (HOSPITAL_COMMUNITY)
Admission: RE | Admit: 2014-06-30 | Discharge: 2014-06-30 | Disposition: A | Discharge status: Home / Self Care | Payer: Medicaid Other | Source: Ambulatory Visit | Attending: Advanced Practice Midwife | Admitting: Advanced Practice Midwife

## 2014-06-30 VITALS — BP 120/60 | HR 80 | Ht 64.0 in | Wt 183.0 lb

## 2014-06-30 DIAGNOSIS — Z01419 Encounter for gynecological examination (general) (routine) without abnormal findings: Secondary | ICD-10-CM | POA: Insufficient documentation

## 2014-06-30 DIAGNOSIS — Z113 Encounter for screening for infections with a predominantly sexual mode of transmission: Secondary | ICD-10-CM

## 2014-06-30 DIAGNOSIS — Z1151 Encounter for screening for human papillomavirus (HPV): Secondary | ICD-10-CM | POA: Insufficient documentation

## 2014-06-30 DIAGNOSIS — Z309 Encounter for contraceptive management, unspecified: Secondary | ICD-10-CM

## 2014-06-30 MED ORDER — FLUCONAZOLE 150 MG PO TABS: ORAL_TABLET | ORAL | Status: DC

## 2014-06-30 NOTE — Patient Instructions (Signed)
RepHresh vaginal cream (or similar) probiotic for the vagina:  Use twice a week OR an oral tablet every day

## 2014-06-30 NOTE — Progress Notes (Signed)
Samantha Blackburn 34 y.o.  Filed Vitals:   06/30/14 0846  BP: 120/60  Pulse: 80     Past Medical History: Past Medical History  Diagnosis Date  . Obesity 12/01/2012  . Diabetes mellitus, new onset 11/29/2012  . Diabetes mellitus without complication     Past Surgical History: Past Surgical History  Procedure Laterality Date  . Cholecystectomy      Family History: Family History  Problem Relation Age of Onset  . Hypertension Mother   . Diabetes Mother   . Cancer Father     lymphoma, kidney,bladder, liver  . Diabetes Father   . Heart attack Father   . Diabetes Maternal Grandmother   . Heart attack Maternal Grandfather   . Stroke Paternal Grandmother     Social History: History  Substance Use Topics  . Smoking status: Former Research scientist (life sciences)  . Smokeless tobacco: Never Used  . Alcohol Use: No    Allergies: No Known Allergies    Current outpatient prescriptions:  .  etonogestrel (IMPLANON) 68 MG IMPL implant, Inject 1 each into the skin once., Disp: , Rfl:  .  insulin glargine (LANTUS) 100 UNIT/ML injection, Inject 0.15 mLs (15 Units total) into the skin at bedtime., Disp: 10 mL, Rfl: 12 .  metFORMIN (GLUCOPHAGE) 500 MG tablet, Take 1 tablet (500 mg total) by mouth 2 (two) times daily with a meal., Disp: 60 tablet, Rfl: 6 .  benzonatate (TESSALON) 200 MG capsule, Take 1 capsule (200 mg total) by mouth 3 (three) times daily as needed for cough. (Patient not taking: Reported on 06/30/2014), Disp: 30 capsule, Rfl: 0 .  fluconazole (DIFLUCAN) 150 MG tablet, 1 PO daily for 5 days, Disp: 5 tablet, Rfl: 2 .  promethazine-codeine (PHENERGAN WITH CODEINE) 6.25-10 MG/5ML syrup, Take 5 mLs by mouth every 4 (four) hours as needed for cough. (Patient not taking: Reported on 06/30/2014), Disp: 120 mL, Rfl: 0  History of Present Illness: Here for Family Planning Pap/physical.  Last pap 9/14, normal.  Has nexplanon, and has noticed over the past month bright red bleeding after intercourse.   Has had 3 yeast infections in the past few months, uses 7 day OTC cream and sx resolve. She has Type 2 DM, "well controlled".  Denies yeast infection sx today.   Review of Systems   Patient denies any headaches, blurred vision, shortness of breath, chest pain, abdominal pain, problems with bowel movements, urination  Physical Exam: General:  Well developed, well nourished, no acute distress Skin:  Warm and dry Neck:  Midline trachea, normal thyroid Lungs; Clear to auscultation bilaterally Breast:  No dominant palpable mass, retraction, or nipple discharge Cardiovascular: Regular rate and rhythm Abdomen:  Soft, non tender, no hepatosplenomegaly Pelvic:  External genitalia sl erythemous.  The vagina is normal in appearance.Heavy white dc c/w yeast.   The cervix is bulbous, friable. Uterus is felt to be normal size, shape, and contour.  No adnexal masses or tenderness noted.  Extremities:  No swelling or varicosities noted Psych:  No mood changes.     Impression: Yeast infection, otherwise normal GYN exam   Plan: Rx Diflucan 150mg  daily for 5 days, then a vaginal probiotic twice a week  HIV, RPR, Hgb per [facility] medicaid guidelines

## 2014-07-01 LAB — CYTOLOGY - PAP

## 2014-09-08 LAB — BLOOD GAS, ARTERIAL
Acid-Base Excess: 3.8 mmol/L — ABNORMAL HIGH (ref 0.0–2.0)
Bicarbonate: 20.9 mEq/L (ref 20.0–24.0)
FIO2: 0.21
O2 Saturation: 63.8 %
TCO2: 19.1 mmol/L (ref 0–100)

## 2015-04-11 ENCOUNTER — Encounter: Payer: Self-pay | Admitting: Obstetrics & Gynecology

## 2015-04-11 ENCOUNTER — Ambulatory Visit (INDEPENDENT_AMBULATORY_CARE_PROVIDER_SITE_OTHER): Payer: 59 | Admitting: Obstetrics & Gynecology

## 2015-04-11 VITALS — BP 140/80 | HR 90 | Ht 64.5 in | Wt 180.5 lb

## 2015-04-11 DIAGNOSIS — Z202 Contact with and (suspected) exposure to infections with a predominantly sexual mode of transmission: Secondary | ICD-10-CM | POA: Diagnosis not present

## 2015-04-11 DIAGNOSIS — Z118 Encounter for screening for other infectious and parasitic diseases: Secondary | ICD-10-CM | POA: Diagnosis not present

## 2015-04-11 DIAGNOSIS — Z711 Person with feared health complaint in whom no diagnosis is made: Secondary | ICD-10-CM

## 2015-04-11 MED ORDER — AZITHROMYCIN 500 MG PO TABS: 2000.0000 mg | ORAL_TABLET | Freq: Once | ORAL | Status: DC

## 2015-04-11 NOTE — Progress Notes (Signed)
Patient ID: Samantha Blackburn, female   DOB: 09/15/1980, 35 y.o.   MRN: [id number]      Chief Complaint  Patient presents with  . STD screening    having no symptoms    Blood pressure 140/80, pulse 90, height 5' 4.5" (1.638 m), weight 180 lb 8 oz (81.874 kg).  35 y.o. [id number] No LMP recorded. Patient has had an implant. The current method of family planning is nexplanon.  Subjective Pt exposed to STD, ?chlamydia vs gonorrhea No symptoms  Objective Vulva:  normal appearing vulva with no masses, tenderness or lesions Vagina:  normal mucosa, no discharge Cervix:  no cervical motion tenderness and no lesions Uterus:  normal size, contour, position, consistency, mobility, non-tender Adnexa: ovaries:present,  normal adnexa in size, nontender and no masses    Pertinent ROS   Labs or studies     Impression Diagnoses this Encounter::   ICD-9-CM ICD-10-CM   1. Concern about STD in female without diagnosis V65.5 Z71.1     Established relevant diagnosis(es):   Plan/Recommendations: Meds ordered this encounter  Medications  . azithromycin (ZITHROMAX) 500 MG tablet    Sig: Take 4 tablets (2,000 mg total) by mouth once.    Dispense:  4 tablet    Refill:  0    Labs or Scans Ordered: No orders of the defined types were placed in this encounter.    Management::   Follow up Return if symptoms worsen or fail to improve.        Face to face time:  na minutes  Greater than 50% of the visit time was spent in counseling and coordination of care with the patient.  The summary and outline of the counseling and care coordination is summarized in the note above.   All questions were answered.

## 2015-04-11 NOTE — Addendum Note (Signed)
Addended by: Linton Rump on: 04/11/2015 04:06 PM   Modules accepted: Orders

## 2015-04-13 LAB — GC/CHLAMYDIA PROBE AMP
CHLAMYDIA, DNA PROBE: NEGATIVE
Neisseria gonorrhoeae by PCR: NEGATIVE

## 2015-10-19 ENCOUNTER — Other Ambulatory Visit: Payer: Self-pay | Admitting: Advanced Practice Midwife

## 2015-10-19 ENCOUNTER — Encounter: Payer: Self-pay | Admitting: Advanced Practice Midwife

## 2015-10-19 ENCOUNTER — Ambulatory Visit (INDEPENDENT_AMBULATORY_CARE_PROVIDER_SITE_OTHER): Payer: 59 | Admitting: Advanced Practice Midwife

## 2015-10-19 VITALS — BP 110/50 | HR 96 | Ht 63.5 in | Wt 184.0 lb

## 2015-10-19 DIAGNOSIS — Z118 Encounter for screening for other infectious and parasitic diseases: Secondary | ICD-10-CM

## 2015-10-19 DIAGNOSIS — Z7251 High risk heterosexual behavior: Secondary | ICD-10-CM

## 2015-10-19 DIAGNOSIS — Z01419 Encounter for gynecological examination (general) (routine) without abnormal findings: Secondary | ICD-10-CM | POA: Diagnosis not present

## 2015-10-19 DIAGNOSIS — Z3046 Encounter for surveillance of implantable subdermal contraceptive: Secondary | ICD-10-CM

## 2015-10-19 DIAGNOSIS — Z1159 Encounter for screening for other viral diseases: Secondary | ICD-10-CM

## 2015-10-19 MED ORDER — FLUCONAZOLE 150 MG PO TABS: ORAL_TABLET | ORAL | 2 refills | Status: DC

## 2015-10-19 NOTE — Progress Notes (Signed)
Samantha Blackburn 35 y.o.  Vitals:   10/19/15 1447  BP: (!) 110/50  Pulse: 96     Filed Weights   10/19/15 1447  Weight: 184 lb (83.5 kg)    Past Medical History: Past Medical History:  Diagnosis Date  . Diabetes mellitus without complication ([place])   . Diabetes mellitus, new onset ([place]) 11/29/2012  . Obesity 12/01/2012    Past Surgical History: Past Surgical History:  Procedure Laterality Date  . CHOLECYSTECTOMY      Family History: Family History  Problem Relation Age of Onset  . Hypertension Mother   . Diabetes Mother   . Cancer Father     lymphoma, kidney,bladder, liver,prostate  . Diabetes Father   . Heart attack Father   . Diabetes Maternal Grandmother   . Heart attack Maternal Grandfather   . Stroke Paternal Grandmother     Social History: Social History  Substance Use Topics  . Smoking status: Former Smoker    Types: Cigarettes  . Smokeless tobacco: Never Used  . Alcohol use No    Allergies: No Known Allergies    Current Outpatient Prescriptions:  .  etonogestrel (IMPLANON) 68 MG IMPL implant, Inject 1 each into the skin once., Disp: , Rfl:  .  fluconazole (DIFLUCAN) 150 MG tablet, 1 po daily for 5 days, Disp: 5 tablet, Rfl: 2  History of Present Illness: Here for FPlanning physical.  Has had regular periods this last year (on 3rd nexplanon). Wants Mirena next (Due to come out now). Has had vulvar itching   Review of Systems   Patient denies any headaches, blurred vision, shortness of breath, chest pain, abdominal pain, problems with bowel movements, urination, or intercourse.   Physical Exam: General:  Well developed, well nourished, no acute distress Skin:  Warm and dry Neck:  Midline trachea, normal thyroid Lungs; Clear to auscultation bilaterally Breast:  No dominant palpable mass, retraction, or nipple discharge Cardiovascular: Regular rate and rhythm Abdomen:  Soft, non tender, no hepatosplenomegaly Pelvic:  External genitalia is  red , looks like a yeast rash  The vagina is normal in appearance.  The cervix is bulbous.  Uterus is felt to be normal size, shape, and contour.  No adnexal masses or tenderness noted.  Extremities:  No swelling or varicosities noted Psych:  No mood changes.     Impression: Normal exam except for vulvar rash--looks like yeast     Plan: diflucan PO X 5 days.  Consider steroid if not improved Insert Mirena and remove Nexplanon 1 week later

## 2015-10-20 LAB — HIV ANTIBODY (ROUTINE TESTING W REFLEX): HIV SCREEN 4TH GENERATION: NONREACTIVE

## 2015-10-21 LAB — GC/CHLAMYDIA PROBE AMP
CHLAMYDIA, DNA PROBE: POSITIVE — AB
Neisseria gonorrhoeae by PCR: NEGATIVE

## 2015-10-25 ENCOUNTER — Other Ambulatory Visit: Payer: Self-pay | Admitting: Advanced Practice Midwife

## 2015-10-25 ENCOUNTER — Encounter: Payer: Self-pay | Admitting: Advanced Practice Midwife

## 2015-10-25 DIAGNOSIS — A749 Chlamydial infection, unspecified: Secondary | ICD-10-CM | POA: Insufficient documentation

## 2015-10-25 MED ORDER — AZITHROMYCIN 500 MG PO TABS: 1000.0000 mg | ORAL_TABLET | Freq: Once | ORAL | 0 refills | Status: AC

## 2015-10-25 NOTE — Progress Notes (Signed)
+   CHl. rx azithromycin 1gm

## 2015-10-26 ENCOUNTER — Ambulatory Visit (INDEPENDENT_AMBULATORY_CARE_PROVIDER_SITE_OTHER): Payer: 59 | Admitting: Advanced Practice Midwife

## 2015-10-26 DIAGNOSIS — A749 Chlamydial infection, unspecified: Secondary | ICD-10-CM

## 2015-10-26 NOTE — Progress Notes (Signed)
Pt not seen. Has + CHL, can't insert IUD.  Pt and partner treated.  Will get TOC 2 weeks and IUD in 2.5 weeks.  Nexplanon removal after that.

## 2015-11-02 ENCOUNTER — Encounter: Payer: 59 | Admitting: Advanced Practice Midwife

## 2015-11-14 ENCOUNTER — Ambulatory Visit (INDEPENDENT_AMBULATORY_CARE_PROVIDER_SITE_OTHER): Payer: 59 | Admitting: Obstetrics and Gynecology

## 2015-11-14 VITALS — BP 140/60 | HR 84 | Wt 184.0 lb

## 2015-11-14 DIAGNOSIS — Z30014 Encounter for initial prescription of intrauterine contraceptive device: Secondary | ICD-10-CM

## 2015-11-14 DIAGNOSIS — Z3202 Encounter for pregnancy test, result negative: Secondary | ICD-10-CM | POA: Diagnosis not present

## 2015-11-14 DIAGNOSIS — Z7689 Persons encountering health services in other specified circumstances: Secondary | ICD-10-CM | POA: Insufficient documentation

## 2015-11-14 DIAGNOSIS — Z309 Encounter for contraceptive management, unspecified: Secondary | ICD-10-CM | POA: Insufficient documentation

## 2015-11-14 DIAGNOSIS — Z30430 Encounter for insertion of intrauterine contraceptive device: Secondary | ICD-10-CM | POA: Diagnosis not present

## 2015-11-14 LAB — GC/CHLAMYDIA PROBE AMP
CHLAMYDIA, DNA PROBE: NEGATIVE
Neisseria gonorrhoeae by PCR: NEGATIVE

## 2015-11-14 LAB — POCT URINE PREGNANCY: Preg Test, Ur: NEGATIVE

## 2015-11-14 NOTE — Progress Notes (Signed)
Patient ID: Samantha Blackburn, female   DOB: Apr 02, 1980, 35 y.o.   MRN: [id number]  [facility] PROCEDURE NOTE- IUD INSERT  Samantha Blackburn is a 35 y.o. [phone] here for ([id number])Mirena  IUD insertion  No GYN concerns.   Last pap smear was on 06/30/14 and was normal.  GC/CHL negative 4 days ago   IUD Insertion Procedure Note Patient identified, informed consent performed, consent signed.   Discussed risks of irregular bleeding, cramping, infection, malpositioning or misplacement of the IUD outside the uterus which may require further procedure such as laparoscopy. Time out was performed.  Urine pregnancy test negative.  Speculum inserted, Cervix visualized.  Cleaned with Betadine x 2.   Grasped anteriorly with a single tooth tenaculum.  Uterus sounded to 8 cm.  Mirena IUD placed per manufacturer's recommendations.   Strings trimmed to 3 cm.  Tenaculum was removed, good hemostasis noted.  Patient tolerated procedure well.   Patient was given post-procedure instructions.  She was advised to have backup contraception for one week.  Patient was also asked to check IUD strings periodically and follow up in 4 weeks for IUD check.   By signing my name below, I, Hansel Feinstein, attest that this documentation has been prepared under the direction and in the presence of Jonnie Kind, MD. Electronically Signed: Hansel Feinstein, ED Scribe. 11/14/15. 2:57 PM.  I personally performed the services described in this documentation, which was SCRIBED in my presence. The recorded information has been reviewed and considered accurate. It has been edited as necessary during review. Jonnie Kind, MD

## 2015-11-15 ENCOUNTER — Ambulatory Visit (INDEPENDENT_AMBULATORY_CARE_PROVIDER_SITE_OTHER): Payer: 59 | Admitting: Women's Health

## 2015-11-15 ENCOUNTER — Encounter: Payer: Self-pay | Admitting: Women's Health

## 2015-11-15 VITALS — BP 110/78 | HR 94

## 2015-11-15 DIAGNOSIS — Z3049 Encounter for surveillance of other contraceptives: Secondary | ICD-10-CM

## 2015-11-15 DIAGNOSIS — Z3046 Encounter for surveillance of implantable subdermal contraceptive: Secondary | ICD-10-CM

## 2015-11-15 NOTE — Patient Instructions (Signed)
Nothing in vagina for 3 days (no sex, douching, tampons, etc...)  Check your strings once a month to make sure you can feel them, if you are not able to please let us know  If you develop a fever of 100.4 or more in the next few weeks, or if you develop severe abdominal pain, please let [place] know  Use a backup method of birth control, such as condoms, for 2 weeks   Keep the area clean and dry.  You can remove the big bandage in 24 hours, and the small steri-strip bandage in 3-5 days.    Levonorgestrel intrauterine device (IUD) What is this medicine? LEVONORGESTREL IUD (LEE voe nor jes trel) is a contraceptive (birth control) device. The device is placed inside the uterus by a healthcare professional. It is used to prevent pregnancy and can also be used to treat heavy bleeding that occurs during your period. Depending on the device, it can be used for 3 to 5 years. This medicine may be used for other purposes; ask your health care provider or pharmacist if you have questions. What should I tell my health care provider before I take this medicine? They need to know if you have any of these conditions: -abnormal Pap smear -cancer of the breast, uterus, or cervix -diabetes -endometritis -genital or pelvic infection now or in the past -have more than one sexual partner or your partner has more than one partner -heart disease -history of an ectopic or tubal pregnancy -immune system problems -IUD in place -liver disease or tumor -problems with blood clots or take blood-thinners -use intravenous drugs -uterus of unusual shape -vaginal bleeding that has not been explained -an unusual or allergic reaction to levonorgestrel, other hormones, silicone, or polyethylene, medicines, foods, dyes, or preservatives -pregnant or trying to get pregnant -breast-feeding How should I use this medicine? This device is placed inside the uterus by a health care professional. Talk to your pediatrician  regarding the use of this medicine in children. Special care may be needed. Overdosage: If you think you have taken too much of this medicine contact a poison control center or emergency room at once. NOTE: This medicine is only for you. Do not share this medicine with others. What if I miss a dose? This does not apply. What may interact with this medicine? Do not take this medicine with any of the following medications: -amprenavir -bosentan -fosamprenavir This medicine may also interact with the following medications: -aprepitant -barbiturate medicines for inducing sleep or treating seizures -bexarotene -griseofulvin -medicines to treat seizures like carbamazepine, ethotoin, felbamate, oxcarbazepine, phenytoin, topiramate -modafinil -pioglitazone -rifabutin -rifampin -rifapentine -some medicines to treat HIV infection like atazanavir, indinavir, lopinavir, nelfinavir, tipranavir, ritonavir -St. John's wort -warfarin This list may not describe all possible interactions. Give your health care provider a list of all the medicines, herbs, non-prescription drugs, or dietary supplements you use. Also tell them if you smoke, drink alcohol, or use illegal drugs. Some items may interact with your medicine. What should I watch for while using this medicine? Visit your doctor or health care professional for regular check ups. See your doctor if you or your partner has sexual contact with others, becomes HIV positive, or gets a sexual transmitted disease. This product does not protect you against HIV infection (AIDS) or other sexually transmitted diseases. You can check the placement of the IUD yourself by reaching up to the top of your vagina with clean fingers to feel the threads. Do not pull on the  threads. It is a good habit to check placement after each menstrual period. Call your doctor right away if you feel more of the IUD than just the threads or if you cannot feel the threads at all. The  IUD may come out by itself. You may become pregnant if the device comes out. If you notice that the IUD has come out use a backup birth control method like condoms and call your health care provider. Using tampons will not change the position of the IUD and are okay to use during your period. What side effects may I notice from receiving this medicine? Side effects that you should report to your doctor or health care professional as soon as possible: -allergic reactions like skin rash, itching or hives, swelling of the face, lips, or tongue -fever, flu-like symptoms -genital sores -high blood pressure -no menstrual period for 6 weeks during use -pain, swelling, warmth in the leg -pelvic pain or tenderness -severe or sudden headache -signs of pregnancy -stomach cramping -sudden shortness of breath -trouble with balance, talking, or walking -unusual vaginal bleeding, discharge -yellowing of the eyes or skin Side effects that usually do not require medical attention (report to your doctor or health care professional if they continue or are bothersome): -acne -breast pain -change in sex drive or performance -changes in weight -cramping, dizziness, or faintness while the device is being inserted -headache -irregular menstrual bleeding within first 3 to 6 months of use -nausea This list may not describe all possible side effects. Call your doctor for medical advice about side effects. You may report side effects to FDA at 1-800-FDA-1088. Where should I keep my medicine? This does not apply. NOTE: This sheet is a summary. It may not cover all possible information. If you have questions about this medicine, talk to your doctor, pharmacist, or health care provider.    2016, Elsevier/Gold Standard. (2011-02-01 13:54:04)

## 2015-11-15 NOTE — Progress Notes (Signed)
Samantha Blackburn is a 35 y.o. year old Caucasian female here for Nexplanon removal.  Patient given informed consent for removal of her Nexplanon. She had Mirena IUD placed yesterday and is doing well.   BP 110/78 (BP Location: Right Arm, Patient Position: Sitting, Cuff Size: Normal)   Pulse 94   LMP 10/10/2015   Appropriate time out taken. Nexplanon site identified.  Area prepped in usual sterile fashon. One cc of 2% lidocaine was used to anesthetize the area at the distal end of the implant. A small stab incision was made right beside the implant on the distal portion.  The Nexplanon rod was grasped using hemostats and removed without difficulty.  There was less than 3 cc blood loss. There were no complications.  Steri-strips were applied over the small incision and a pressure bandage was applied.  The patient tolerated the procedure well.  She was instructed to keep the area clean and dry, remove pressure bandage in 24 hours, and keep insertion site covered with the steri-strip for 3-5 days.    Follow-up 4wks for IUD check  Tawnya Crook CNM, [facility] 11/15/2015 4:39 PM

## 2015-12-13 ENCOUNTER — Ambulatory Visit (INDEPENDENT_AMBULATORY_CARE_PROVIDER_SITE_OTHER): Payer: 59 | Admitting: Women's Health

## 2015-12-13 ENCOUNTER — Encounter: Payer: Self-pay | Admitting: Women's Health

## 2015-12-13 VITALS — BP 124/60 | Ht 63.5 in | Wt 183.0 lb

## 2015-12-13 DIAGNOSIS — N938 Other specified abnormal uterine and vaginal bleeding: Secondary | ICD-10-CM | POA: Diagnosis not present

## 2015-12-13 DIAGNOSIS — Z30431 Encounter for routine checking of intrauterine contraceptive device: Secondary | ICD-10-CM

## 2015-12-13 NOTE — Progress Notes (Signed)
   [facility] Visit  Patient name: Samantha Blackburn MRN [id number]  Date of birth: January 15, 1981  CC & HPI:  Samantha Blackburn is a 35 y.o. G60P2002 Caucasian female presenting today for IUD check. Had Mirena IUD placed 11/14/15. Tried to feel for strings but was unable to. Some spotting. No cramping since insertion. No pain w/ sex.   No LMP recorded. Patient is not currently having periods (Reason: IUD). The current method of family planning is IUD. Last pap June 2016, neg w/ -HRHPV  Pertinent History Reviewed:  Medical & Surgical Hx:   Past medical, surgical, family, and social history reviewed in electronic medical record Medications: Reviewed & Updated - see associated section Allergies: Reviewed in electronic medical record  Objective Findings:  Vitals: BP 124/60 (BP Location: Right Arm, Patient Position: Sitting, Cuff Size: Normal)   Ht 5' 3.5" (1.613 m)   Wt 183 lb (83 kg)   BMI 31.91 kg/m  Body mass index is 31.91 kg/m.  Physical Examination: General appearance - alert, well appearing, and in no distress Pelvic - IUD strings visible, tucked behind cx from 12 curving around to 3 o'clock  No results found for this or any previous visit (from the past 24 hour(s)).   Assessment & Plan:  A:   IUD check  P:  Check strings monthly  Return for June for physical.  Tawnya Crook CNM, [facility] 12/13/2015 4:17 PM

## 2015-12-13 NOTE — Patient Instructions (Addendum)
Check strings monthly  Levonorgestrel intrauterine device (IUD) What is this medicine? LEVONORGESTREL IUD (LEE voe nor jes trel) is a contraceptive (birth control) device. The device is placed inside the uterus by a healthcare professional. It is used to prevent pregnancy. This device can also be used to treat heavy bleeding that occurs during your period. This medicine may be used for other purposes; ask your health care provider or pharmacist if you have questions. COMMON BRAND NAME(S): Minette Headland What should I tell my health care provider before I take this medicine? They need to know if you have any of these conditions: -abnormal Pap smear -cancer of the breast, uterus, or cervix -diabetes -endometritis -genital or pelvic infection now or in the past -have more than one sexual partner or your partner has more than one partner -heart disease -history of an ectopic or tubal pregnancy -immune system problems -IUD in place -liver disease or tumor -problems with blood clots or take blood-thinners -seizures -use intravenous drugs -uterus of unusual shape -vaginal bleeding that has not been explained -an unusual or allergic reaction to levonorgestrel, other hormones, silicone, or polyethylene, medicines, foods, dyes, or preservatives -pregnant or trying to get pregnant -breast-feeding How should I use this medicine? This device is placed inside the uterus by a health care professional. Talk to your pediatrician regarding the use of this medicine in children. Special care may be needed. Overdosage: If you think you have taken too much of this medicine contact a poison control center or emergency room at once. NOTE: This medicine is only for you. Do not share this medicine with others. What if I miss a dose? This does not apply. Depending on the brand of device you have inserted, the device will need to be replaced every 3 to 5 years if you wish to continue using  this type of birth control. What may interact with this medicine? Do not take this medicine with any of the following medications: -amprenavir -bosentan -fosamprenavir This medicine may also interact with the following medications: -aprepitant -barbiturate medicines for inducing sleep or treating seizures -bexarotene -griseofulvin -medicines to treat seizures like carbamazepine, ethotoin, felbamate, oxcarbazepine, phenytoin, topiramate -modafinil -pioglitazone -rifabutin -rifampin -rifapentine -some medicines to treat HIV infection like atazanavir, indinavir, lopinavir, nelfinavir, tipranavir, ritonavir -St. John's wort -warfarin This list may not describe all possible interactions. Give your health care provider a list of all the medicines, herbs, non-prescription drugs, or dietary supplements you use. Also tell them if you smoke, drink alcohol, or use illegal drugs. Some items may interact with your medicine. What should I watch for while using this medicine? Visit your doctor or health care professional for regular check ups. See your doctor if you or your partner has sexual contact with others, becomes HIV positive, or gets a sexual transmitted disease. This product does not protect you against HIV infection (AIDS) or other sexually transmitted diseases. You can check the placement of the IUD yourself by reaching up to the top of your vagina with clean fingers to feel the threads. Do not pull on the threads. It is a good habit to check placement after each menstrual period. Call your doctor right away if you feel more of the IUD than just the threads or if you cannot feel the threads at all. The IUD may come out by itself. You may become pregnant if the device comes out. If you notice that the IUD has come out use a backup birth control method like condoms and call  your health care provider. Using tampons will not change the position of the IUD and are okay to use during your  period. This IUD can be safely scanned with magnetic resonance imaging (MRI) only under specific conditions. Before you have an MRI, tell your healthcare provider that you have an IUD in place, and which type of IUD you have in place. What side effects may I notice from receiving this medicine? Side effects that you should report to your doctor or health care professional as soon as possible: -allergic reactions like skin rash, itching or hives, swelling of the face, lips, or tongue -fever, flu-like symptoms -genital sores -high blood pressure -no menstrual period for 6 weeks during use -pain, swelling, warmth in the leg -pelvic pain or tenderness -severe or sudden headache -signs of pregnancy -stomach cramping -sudden shortness of breath -trouble with balance, talking, or walking -unusual vaginal bleeding, discharge -yellowing of the eyes or skin Side effects that usually do not require medical attention (report to your doctor or health care professional if they continue or are bothersome): -acne -breast pain -change in sex drive or performance -changes in weight -cramping, dizziness, or faintness while the device is being inserted -headache -irregular menstrual bleeding within first 3 to 6 months of use -nausea This list may not describe all possible side effects. Call your doctor for medical advice about side effects. You may report side effects to FDA at 1-800-FDA-1088. Where should I keep my medicine? This does not apply. NOTE: This sheet is a summary. It may not cover all possible information. If you have questions about this medicine, talk to your doctor, pharmacist, or health care provider.  2017 Elsevier/Gold Standard (2015-06-24 13:46:37)

## 2016-03-26 ENCOUNTER — Ambulatory Visit: Payer: 59 | Admitting: Women's Health

## 2016-03-29 ENCOUNTER — Encounter: Payer: Self-pay | Admitting: Women's Health

## 2016-03-29 ENCOUNTER — Ambulatory Visit (INDEPENDENT_AMBULATORY_CARE_PROVIDER_SITE_OTHER): Payer: 59 | Admitting: Women's Health

## 2016-03-29 VITALS — BP 130/80 | HR 80 | Ht 64.0 in | Wt 175.0 lb

## 2016-03-29 DIAGNOSIS — A5909 Other urogenital trichomoniasis: Secondary | ICD-10-CM

## 2016-03-29 DIAGNOSIS — N898 Other specified noninflammatory disorders of vagina: Secondary | ICD-10-CM

## 2016-03-29 DIAGNOSIS — N930 Postcoital and contact bleeding: Secondary | ICD-10-CM | POA: Diagnosis not present

## 2016-03-29 DIAGNOSIS — R350 Frequency of micturition: Secondary | ICD-10-CM | POA: Diagnosis not present

## 2016-03-29 LAB — POCT URINALYSIS DIPSTICK
Blood, UA: NEGATIVE
Glucose, UA: 3
Leukocytes, UA: NEGATIVE
Nitrite, UA: NEGATIVE
Protein, UA: NEGATIVE

## 2016-03-29 LAB — POCT WET PREP (WET MOUNT): Clue Cells Wet Prep Whiff POC: NEGATIVE

## 2016-03-29 MED ORDER — METRONIDAZOLE 500 MG PO TABS: 500.0000 mg | ORAL_TABLET | Freq: Two times a day (BID) | ORAL | 0 refills | Status: DC

## 2016-03-29 NOTE — Progress Notes (Signed)
   [facility] Visit  Patient name: MARLINA CATALDI MRN [id number]  Date of birth: 02-05-1980  CC & HPI:  JAYDAN CHRETIEN is a 36 y.o. G56P2002 Caucasian female presenting today for report of heavy brb w/ sex 'for awhile', worse in past few months. Some vaginal odor, no itching/irritation. Frequency w/ urination, no other uti sx. Has DM, well controlled w/o meds.  No LMP recorded. Patient is not currently having periods (Reason: IUD). The current method of family planning is IUD. Last pap June 2016, neg  Pertinent History Reviewed:  Medical & Surgical Hx:   Past medical, surgical, family, and social history reviewed in electronic medical record Medications: Reviewed & Updated - see associated section Allergies: Reviewed in electronic medical record  Objective Findings:  Vitals: BP 130/80   Pulse 80   Ht 5\' 4"  (1.626 m)   Wt 175 lb (79.4 kg)   BMI 30.04 kg/m  Body mass index is 30.04 kg/m.  Physical Examination: General appearance - alert, well appearing, and in no distress Pelvic - mod amt thin white malodorous d/c, cx friable, bled w/ barely touching w/ cotton-tipped swab, IUD strings visible  Results for orders placed or performed in visit on 03/29/16 (from the past 24 hour(s))  POCT urinalysis dipstick   Collection Time: 03/29/16  2:37 PM  Result Value Ref Range   Color, UA     Clarity, UA     Glucose, UA 3    Bilirubin, UA     Ketones, UA small    Spec Grav, UA  1.030 - 1.035   Blood, UA neg    pH, UA  5.0 - 8.0   Protein, UA neg    Urobilinogen, UA  Negative - 2.0   Nitrite, UA neg    Leukocytes, UA Negative Negative  POCT Wet Prep [facility] [place])   Collection Time: 03/29/16  3:38 PM  Result Value Ref Range   Source Wet Prep POC vaginal    WBC, Wet Prep HPF POC mod    Bacteria Wet Prep HPF POC None (A) Few   BACTERIA WET PREP MORPHOLOGY POC     Clue Cells Wet Prep HPF POC None None   Clue Cells Wet Prep Whiff POC Negative Whiff    Yeast Wet Prep HPF POC  None    KOH Wet Prep POC     Trichomonas Wet Prep HPF POC Present (A) Absent     Assessment & Plan:  A:   BRB w/ sex  Trichomonas  P:  gc/ct from urine  Rx metronidazole bid x 7d for trichomonas, no etoh or sex while taking  Called in rx for same for partner Fannye Myer, dob [birth year]17[birth year]82, nkda to [employer]  No sex until at least 7d from both finishing meds  Return in about 3 weeks (around 04/19/2016) for f/u.POC    Tawnya Crook CNM, [facility] 03/29/2016 3:38 PM

## 2016-03-29 NOTE — Patient Instructions (Addendum)
No sex for at least 7 days from the time you have both finished the medicine  Trichomoniasis Trichomoniasis is an STI (sexually transmitted infection) that can affect both women and men. In women, the outer area of the female genitalia (vulva) and the vagina are affected. In men, the penis is mainly affected, but the prostate and other reproductive organs can also be involved. This condition can be treated with medicine. It often has no symptoms (is asymptomatic), especially in men. What are the causes? This condition is caused by an organism called Trichomonas vaginalis. Trichomoniasis most often spreads from person to person (is contagious) through sexual contact. What increases the risk? The following factors may make you more likely to develop this condition:  Having unprotected sexual intercourse.  Having sexual intercourse with a partner who has trichomoniasis.  Having multiple sexual partners.  Having had previous trichomoniasis infections or other STIs. What are the signs or symptoms? In women, symptoms of trichomoniasis include:  Abnormal vaginal discharge that is clear, white, gray, or yellow-green and foamy and has an unusual "fishy" odor.  Itching and irritation of the vagina and vulva.  Burning or pain during urination or sexual intercourse.  Genital redness and swelling. In men, symptoms of trichomoniasis include:  Penile discharge that may be foamy or contain pus.  Pain in the penis. This may happen only when urinating.  Itching or irritation inside the penis.  Burning after urination or ejaculation. How is this diagnosed? In women, this condition may be found during a routine Pap test or physical exam. It may be found in men during a routine physical exam. Your health care provider may perform tests to help diagnose this infection, such as:  Urine tests (men and women).  The following in women:  Testing the pH of the vagina.  A vaginal swab test that checks  for the Trichomonas vaginalis organism.  Testing vaginal secretions. Your health care provider may test you for other STIs, including HIV (human immunodeficiency virus). How is this treated? This condition is treated with medicine taken by mouth (orally), such as metronidazole or tinidazole to fight the infection. Your sexual partner(s) may also need to be tested and treated.  If you are a woman and you plan to become pregnant or think you may be pregnant, tell your health care provider right away. Some medicines that are used to treat the infection should not be taken during pregnancy. Your health care provider may recommend over-the-counter medicines or creams to help relieve itching or irritation. You may be tested for infection again 3 months after treatment. Follow these instructions at home:  Take and use over-the-counter and prescription medicines, including creams, only as told by your health care provider.  Do not have sexual intercourse until one week after you finish your medicine, or until your health care provider approves. Ask your health care provider when you may resume sexual intercourse.  (Women) Do not douche or wear tampons while you have the infection.  Discuss your infection with your sexual partner(s). Make sure that your partner gets tested and treated, if necessary.  Keep all follow-up visits as told by your health care provider. This is important. How is this prevented?  Use condoms every time you have sex. Using condoms correctly and consistently can help protect against STIs.  Avoid having multiple sexual partners.  Talk with your sexual partner about any symptoms that either of you may have, as well as any history of STIs.  Get tested for STIs  and STDs (sexually transmitted diseases) before you have sex. Ask your partner to do the same.  Do not have sexual contact if you have symptoms of trichomoniasis or another STI. Contact a health care provider  if:  You still have symptoms after you finish your medicine.  You develop pain in your abdomen.  You have pain when you urinate.  You have bleeding after sexual intercourse.  You develop a rash.  You feel nauseous or you vomit.  You plan to become pregnant or think you may be pregnant. Summary  Trichomoniasis is an STI (sexually transmitted infection) that can affect both women and men.  This condition often has no symptoms (is asymptomatic), especially in men.  You should not have sexual intercourse until one week after you finish your medicine, or until your health care provider approves. Ask your health care provider when you may resume sexual intercourse.  Discuss your infection with your sexual partner. Make sure that your partner gets tested and treated, if necessary. This information is not intended to replace advice given to you by your health care provider. Make sure you discuss any questions you have with your health care provider. Document Released: 06/27/2000 Document Revised: 11/25/2015 Document Reviewed: 11/25/2015 Elsevier Interactive Patient Education  2017 [employer].

## 2016-03-30 LAB — GC/CHLAMYDIA PROBE AMP
Chlamydia trachomatis, NAA: NEGATIVE
Neisseria gonorrhoeae by PCR: NEGATIVE

## 2016-04-19 ENCOUNTER — Ambulatory Visit (INDEPENDENT_AMBULATORY_CARE_PROVIDER_SITE_OTHER): Payer: 59 | Admitting: Adult Health

## 2016-04-19 ENCOUNTER — Encounter: Payer: Self-pay | Admitting: Adult Health

## 2016-04-19 VITALS — BP 130/80 | HR 84 | Ht 64.0 in | Wt 176.0 lb

## 2016-04-19 DIAGNOSIS — Z8619 Personal history of other infectious and parasitic diseases: Secondary | ICD-10-CM | POA: Diagnosis not present

## 2016-04-19 DIAGNOSIS — Z975 Presence of (intrauterine) contraceptive device: Secondary | ICD-10-CM | POA: Insufficient documentation

## 2016-04-19 LAB — POCT WET PREP (WET MOUNT)
Trichomonas Wet Prep HPF POC: ABSENT
WBC, Wet Prep HPF POC: NEGATIVE

## 2016-04-19 NOTE — Patient Instructions (Signed)
Call if any more spotting

## 2016-04-19 NOTE — Progress Notes (Signed)
Subjective:     Patient ID: Samantha Blackburn, female   DOB: September 17, 1980, 36 y.o.   MRN: [id number]  HPI Samantha Blackburn is a 36 year old white female in for proof of treatment of recent trich infection, had spotting after sex, no pain.  Review of Systems History of spotting after sex, no pain  Reviewed past medical,surgical, social and family history. Reviewed medications and allergies.     Objective:   Physical Exam BP 130/80 (BP Location: Left Arm, Patient Position: Sitting, Cuff Size: Normal)   Pulse 84   Ht 5\' 4"  (1.626 m)   Wt 176 lb (79.8 kg)   BMI 30.21 kg/m  Skin warm and dry.Pelvic: external genitalia is normal in appearance no lesions, vagina: white discharge without odor,urethra has no lesions or masses noted, cervix:smooth and bulbous,+IUD strings, no spotting with Q-tip, uterus: normal size, shape and contour, non tender, no masses felt, adnexa: no masses or tenderness noted. Bladder is non tender and no masses felt. Wet prep: no trich or WBCs    Assessment:     1. History of trichomoniasis   2. IUD (intrauterine device) in place       Plan:     Call with any more spotting Follow up prn

## 2016-05-08 ENCOUNTER — Telehealth: Payer: Self-pay | Admitting: *Deleted

## 2016-05-08 MED ORDER — METRONIDAZOLE 500 MG PO TABS: ORAL_TABLET | ORAL | 0 refills | Status: DC

## 2016-05-08 NOTE — Telephone Encounter (Signed)
Partner did not take meds for trich, will Rx flagyl for pt and partner again, if spotting continues call for appt. His name is Samantha Blackburn DOB [birth year], and his pharmacy is [facility] in [place].

## 2016-05-08 NOTE — Telephone Encounter (Signed)
Patient called stating her boyfriend did not take medication like he was supposed too. She is now bleeding again and is requesting treatment again for both. Please advise.  Samantha Blackburn, dob [birth year], nkda to [employer]

## 2016-05-29 ENCOUNTER — Telehealth: Payer: Self-pay | Admitting: Adult Health

## 2016-05-29 NOTE — Telephone Encounter (Signed)
Patient called stating she is still bleeding and you would change medications if that happened. Please advise.

## 2016-05-29 NOTE — Telephone Encounter (Signed)
Bleeding after sex, has IUD, to come in tomorrow for exam

## 2016-05-29 NOTE — Telephone Encounter (Signed)
Pt seen Anderson Malta a few weeks ago and was treated for one of her symptoms and the now she is Bleeding a lot and she would like to know what Anderson Malta would like to do. Please contact pt

## 2016-05-30 ENCOUNTER — Ambulatory Visit: Payer: 59 | Admitting: Adult Health

## 2016-06-01 ENCOUNTER — Other Ambulatory Visit: Payer: Self-pay | Admitting: Adult Health

## 2016-06-01 ENCOUNTER — Ambulatory Visit (INDEPENDENT_AMBULATORY_CARE_PROVIDER_SITE_OTHER): Payer: 59 | Admitting: Adult Health

## 2016-06-01 ENCOUNTER — Encounter: Payer: Self-pay | Admitting: Adult Health

## 2016-06-01 VITALS — BP 128/70 | HR 102 | Ht 64.0 in | Wt 172.5 lb

## 2016-06-01 DIAGNOSIS — Z3202 Encounter for pregnancy test, result negative: Secondary | ICD-10-CM

## 2016-06-01 DIAGNOSIS — N930 Postcoital and contact bleeding: Secondary | ICD-10-CM | POA: Diagnosis not present

## 2016-06-01 DIAGNOSIS — N841 Polyp of cervix uteri: Secondary | ICD-10-CM

## 2016-06-01 DIAGNOSIS — Z975 Presence of (intrauterine) contraceptive device: Secondary | ICD-10-CM | POA: Diagnosis not present

## 2016-06-01 LAB — POCT URINE PREGNANCY: Preg Test, Ur: NEGATIVE

## 2016-06-01 MED ORDER — METRONIDAZOLE 0.75 % VA GEL: 1.0000 | Freq: Every day | VAGINAL | 0 refills | Status: DC

## 2016-06-01 NOTE — Addendum Note (Signed)
Addended by: Linton Rump on: 06/01/2016 02:10 PM   Modules accepted: Orders

## 2016-06-01 NOTE — Progress Notes (Signed)
Subjective:     Patient ID: Samantha Blackburn, female   DOB: Oct 03, 1980, 36 y.o.   MRN: [id number]  HPI Soren is a 36 years old, white female, married in complaining of bleeding after sex.Has IUD.   Review of Systems Bleeding after sex Reviewed past medical,surgical, social and family history. Reviewed medications and allergies.     Objective:   Physical Exam BP 128/70 (BP Location: Left Arm, Patient Position: Sitting, Cuff Size: Normal)   Pulse (!) 102   Ht 5\' 4"  (1.626 m)   Wt 172 lb 8 oz (78.2 kg)   BMI 29.61 kg/m UPT negative,Skin warm and dry.Pelvic: external genitalia is normal in appearance no lesions, vagina: scant discharge without odor,urethra has no lesions or masses noted, cervix: bulbous,+IUD strings at os, +bleeding with Q tip contact and small polyp at 1 o'clock at cervical os, easily removed with her permission, using cervical biopsy instrument, uterus: normal size, shape and contour, non tender, no masses felt, adnexa: no masses or tenderness noted. Bladder is non tender and no masses felt.     Assessment:        1. Postcoital and contact bleeding   2. Polyp at cervical os   3. IUD (intrauterine device) in place   4. Pregnancy examination or test, negative result    Plan:     Meds ordered this encounter  Medications  . metroNIDAZOLE (METROGEL VAGINAL) 0.75 % vaginal gel    Sig: Place 1 Applicatorful vaginally at bedtime.    Dispense:  70 g    Refill:  0    Order Specific Question:   Supervising Provider    Answer:   Florian Buff [[id number]]    Polyp sent to pathology No sex  Follow up in about 2-3 weeks, will get [place] if persists

## 2016-06-06 ENCOUNTER — Telehealth: Payer: Self-pay | Admitting: Adult Health

## 2016-06-06 NOTE — Telephone Encounter (Signed)
Patient called for results of polyp sent to pathology but I do not see anything. Please advise.

## 2016-06-06 NOTE — Telephone Encounter (Signed)
Pt called stating that she would like for Rapelje or Tish to give her a call back regarding her results. Please contact pt

## 2016-06-06 NOTE — Telephone Encounter (Signed)
Informed patient that results may take a week to come back. Verbalized understanding.

## 2016-06-18 ENCOUNTER — Encounter: Payer: Self-pay | Admitting: Adult Health

## 2016-06-18 ENCOUNTER — Ambulatory Visit (INDEPENDENT_AMBULATORY_CARE_PROVIDER_SITE_OTHER): Payer: 59 | Admitting: Adult Health

## 2016-06-18 VITALS — BP 122/66 | Ht 64.0 in | Wt 176.0 lb

## 2016-06-18 DIAGNOSIS — Z975 Presence of (intrauterine) contraceptive device: Secondary | ICD-10-CM | POA: Diagnosis not present

## 2016-06-18 DIAGNOSIS — N930 Postcoital and contact bleeding: Secondary | ICD-10-CM | POA: Diagnosis not present

## 2016-06-18 NOTE — Progress Notes (Signed)
Subjective:     Patient ID: Samantha Blackburn, female   DOB: September 03, 1980, 36 y.o.   MRN: [id number]  HPI Dnyla is a 36 year old white female back in follow up of having spotting after sex, has IUD and had cervical polyp removed.No spotting since polyp removed, but has not had sex yet.   Review of Systems No spotting Reviewed past medical,surgical, social and family history. Reviewed medications and allergies.     Objective:   Physical Exam BP 122/66 (BP Location: Left Arm, Patient Position: Sitting, Cuff Size: Normal)   Ht 5\' 4"  (1.626 m)   Wt 176 lb (79.8 kg)   BMI 30.21 kg/m Declines exam, no spotting, aware that pathology showed benign polyp.    Assessment:     1. Postcoital and contact bleeding   2. IUD (intrauterine device) in place       Plan:     Follow up prn

## 2016-06-27 ENCOUNTER — Telehealth: Payer: Self-pay | Admitting: Adult Health

## 2016-06-27 DIAGNOSIS — N930 Postcoital and contact bleeding: Secondary | ICD-10-CM

## 2016-06-27 NOTE — Telephone Encounter (Signed)
Pt called stating that she would like to speak with Anderson Malta regarding her bleeding. Please contact pt

## 2016-06-27 NOTE — Telephone Encounter (Signed)
Having bleeding again, will make appt for [place] and see me next week

## 2016-06-27 NOTE — Telephone Encounter (Signed)
Patient states she is still having very bleeding with intercourse. Please advise.

## 2016-07-06 ENCOUNTER — Ambulatory Visit (INDEPENDENT_AMBULATORY_CARE_PROVIDER_SITE_OTHER): Payer: 59

## 2016-07-06 DIAGNOSIS — N930 Postcoital and contact bleeding: Secondary | ICD-10-CM | POA: Diagnosis not present

## 2016-07-06 NOTE — Progress Notes (Signed)
PELVIC US TA/TV:homogeneous anteverted uterus,wnl,normal ovaries bilat,IUD is centrally located w/in the endometrium,EEC 2.5 mm,no free fluid,ovaries appear mobile,no pain during ultrasound

## 2016-07-09 ENCOUNTER — Telehealth: Payer: Self-pay | Admitting: Adult Health

## 2016-07-09 MED ORDER — METRONIDAZOLE 0.75 % VA GEL: 1.0000 | Freq: Every day | VAGINAL | 0 refills | Status: DC

## 2016-07-09 NOTE — Telephone Encounter (Signed)
Left message that [place] was normal, lets try metrogel at least once a week and see if that helps with postcoital spotting

## 2016-07-27 ENCOUNTER — Telehealth: Payer: Self-pay | Admitting: Adult Health

## 2016-07-27 NOTE — Telephone Encounter (Signed)
Pt called stating that she would like a call back from [place] or her Nurse, Pt did not specify the reason why. Please contact pt

## 2016-07-27 NOTE — Telephone Encounter (Signed)
Patient states she was prescribed metrogel to help with bleeding but states she has been bleeding for 3 days straight. Patient wanted to speak with you but informed her you were out of the office until Monday.

## 2016-07-30 MED ORDER — MEGESTROL ACETATE 40 MG PO TABS: 40.0000 mg | ORAL_TABLET | Freq: Every day | ORAL | 1 refills | Status: DC

## 2016-07-30 NOTE — Telephone Encounter (Signed)
Pt having bleeding with IUD again esp after sex, will try megace

## 2018-02-17 ENCOUNTER — Other Ambulatory Visit: Payer: 59 | Admitting: Women's Health

## 2018-02-21 DIAGNOSIS — E079 Disorder of thyroid, unspecified: Secondary | ICD-10-CM | POA: Insufficient documentation

## 2018-03-23 ENCOUNTER — Encounter (HOSPITAL_COMMUNITY): Payer: Self-pay | Admitting: Emergency Medicine

## 2018-03-23 ENCOUNTER — Emergency Department (HOSPITAL_COMMUNITY)
Admission: EM | Admit: 2018-03-23 | Discharge: 2018-03-23 | Disposition: A | Discharge status: Home / Self Care | Payer: Self-pay | Attending: Emergency Medicine | Admitting: Emergency Medicine

## 2018-03-23 ENCOUNTER — Other Ambulatory Visit: Payer: Self-pay

## 2018-03-23 DIAGNOSIS — R112 Nausea with vomiting, unspecified: Secondary | ICD-10-CM

## 2018-03-23 DIAGNOSIS — E119 Type 2 diabetes mellitus without complications: Secondary | ICD-10-CM | POA: Insufficient documentation

## 2018-03-23 DIAGNOSIS — Z87891 Personal history of nicotine dependence: Secondary | ICD-10-CM | POA: Insufficient documentation

## 2018-03-23 DIAGNOSIS — R197 Diarrhea, unspecified: Secondary | ICD-10-CM | POA: Insufficient documentation

## 2018-03-23 DIAGNOSIS — Z79899 Other long term (current) drug therapy: Secondary | ICD-10-CM | POA: Insufficient documentation

## 2018-03-23 DIAGNOSIS — J020 Streptococcal pharyngitis: Secondary | ICD-10-CM | POA: Insufficient documentation

## 2018-03-23 MED ORDER — ONDANSETRON 4 MG PO TBDP: 4.0000 mg | ORAL_TABLET | Freq: Three times a day (TID) | ORAL | 0 refills | Status: DC | PRN

## 2018-03-23 MED ORDER — IBUPROFEN 600 MG PO TABS: 600.0000 mg | ORAL_TABLET | Freq: Four times a day (QID) | ORAL | 0 refills | Status: DC | PRN

## 2018-03-23 MED ORDER — PENICILLIN G BENZATHINE 1200000 UNIT/2ML IM SUSP
1.2000 10*6.[IU] | Freq: Once | INTRAMUSCULAR | Status: AC
  Administered 2018-03-23: 1.2 10*6.[IU] via INTRAMUSCULAR
  Filled 2018-03-23: qty 2

## 2018-03-23 MED ORDER — ONDANSETRON 4 MG PO TBDP
4.0000 mg | ORAL_TABLET | Freq: Once | ORAL | Status: AC
  Administered 2018-03-23: 4 mg via ORAL
  Filled 2018-03-23: qty 1

## 2018-03-23 NOTE — Discharge Instructions (Addendum)
You have been given a dose of antibiotics for strep throat.  This completes the course of medicines and you will not need to take any further antibiotics for this illness.  If you should develop severe or worsening symptoms please return to the emergency department.  Take ibuprofen as prescribed 3 times daily and Zofran every 6 hours as needed for nausea.  Seek a medical exam for increasing pain, difficulty breathing speaking swallowing or any worsening symptoms.  [facility] Primary Care Doctor List    Sinda Du MD. Specialty: Pulmonary Disease Contact information: [place]  [place] [address]  [phone]   Tula Nakayama, MD. Specialty: [facility] Medicine Contact information: [address] [place] [address]  [phone]   Sallee Lange, MD. Specialty: [facility] Medicine Contact information: [place]  [place] [address]  [phone]   Rosita Fire, MD Specialty: Internal Medicine Contact information: [place] [address]  [phone]   Delphina Cahill, MD. Specialty: Internal Medicine Contact information: [place] [address]  [phone]    [facility] (Dr. Maudie Mercury) Specialty: Family Medicine Contact information: [place] [address]  [phone]   Leslie Andrea, MD. Specialty: [facility] Medicine Contact information: [place] [address]  [phone]   Asencion Noble, MD. Specialty: Internal Medicine Contact information: [address]  [place] [address]  [place]  [address] [place], [place] [address] [phone]  Services [facility] offers a variety of basic health services.  Services include but are not limited to: Blood pressure checks  Heart rate checks  Blood sugar checks  Urine analysis   Rapid strep tests  Pregnancy tests.  Health education and referrals  People needing more complex services will be directed to a physician online. Using these virtual visits, doctors can evaluate and prescribe medicine and treatments. There will be no medication on-site, though [facility] will help patients fill their prescriptions at little to no cost.   For More information please go to: [email or web address]

## 2018-03-23 NOTE — ED Provider Notes (Signed)
[facility] EMERGENCY DEPARTMENT Provider Note   CSN: [id number] Arrival date & time: 03/23/18  1347    History   Chief Complaint Chief Complaint  Patient presents with  . Emesis    HPI Samantha Blackburn is a 38 y.o. female.     HPI  The patient is a 38 year old female, she has a known history of a sore throat which is been going on for a couple of days, she reports that she initially became sick on Friday, she had a severe sore throat which also was associated with a headache and measured a fever of 101 yesterday.  She has had both nausea vomiting and diarrhea but today is just left with the diarrhea the headache and the sore throat.  She reports looking in the back of her throat and seeing white patches on her tonsils.  The symptoms are persistent, gradually worsening, severe and not associated with coughing.  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  Past Medical History:  Diagnosis Date  . Diabetes mellitus without complication ([place])   . Diabetes mellitus, new onset ([place]) 11/29/2012  . Obesity 12/01/2012  . Trichimoniasis     Patient Active Problem List   Diagnosis Date Noted  . IUD (intrauterine device) in place 04/19/2016  . History of trichomoniasis 04/19/2016  . 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 Contraception management 11/14/2015  . Chlamydia 10/25/2015  . Obesity 12/01/2012  . Diabetes mellitus, new onset ([place]) 11/29/2012  . Polydipsia 11/29/2012  . Polyuria 11/29/2012  . H/O gestational diabetes mellitus, not currently pregnant 11/29/2012    Past Surgical History:  Procedure Laterality Date  . CHOLECYSTECTOMY       OB History    Gravida  2   Para  2   Term  2   Preterm      AB      Living  2     SAB      TAB      Ectopic      Multiple      Live Births  2            Home Medications    Prior to Admission medications     Medication Sig Start Date End Date Taking? Authorizing Provider  ibuprofen (ADVIL,MOTRIN) 600 MG tablet Take 1 tablet (600 mg total) by mouth every 6 (six) hours as needed. 03/23/18   Noemi Chapel, MD  levonorgestrel (MIRENA) 20 MCG/24HR IUD 1 each by Intrauterine route once.    [provider]  megestrol (MEGACE) 40 MG tablet Take 1 tablet (40 mg total) by mouth daily. 07/30/16   Estill Dooms, NP  metroNIDAZOLE (METROGEL VAGINAL) 0.75 % vaginal gel Place 1 Applicatorful vaginally at bedtime. 07/09/16   Estill Dooms, NP  ondansetron (ZOFRAN ODT) 4 MG disintegrating tablet Take 1 tablet (4 mg total) by mouth every 8 (eight) hours as needed for nausea. 03/23/18   Noemi Chapel, MD    Family History Family History  Problem Relation Age of Onset  . Hypertension Mother   . Diabetes Mother   . Cancer Father        lymphoma, kidney,bladder, liver,prostate  . Diabetes Father   . Heart attack Father   . Diabetes Maternal Grandmother   . Heart attack Maternal Grandfather   . Stroke Paternal Grandmother     Social History Social History   Tobacco Use  .  Smoking status: Former Smoker    Types: Cigarettes  . Smokeless tobacco: Never Used  Substance Use Topics  . Alcohol use: No  . Drug use: No     Allergies   Patient has no known allergies.   Review of Systems Review of Systems  Constitutional: Positive for fever.  HENT: Positive for sore throat.   Respiratory: Negative for cough.   Gastrointestinal: Positive for diarrhea and vomiting.  Neurological: Positive for headaches.     Physical Exam Updated Vital Signs BP (!) 172/84 (BP Location: Right Arm)   Pulse (!) 110   Temp 99.2 F (37.3 C) (Oral)   Resp 16   Ht 1.613 m (5' 3.5")   Wt 74.8 kg   SpO2 100%   BMI 28.77 kg/m   Physical Exam Vitals signs and nursing note reviewed.  Constitutional:      General: She is not in acute distress.    Appearance: She is well-developed.  HENT:     Head:  Normocephalic and atraumatic.     Ears:     Comments: Bilateral tympanic membranes appear slightly opaque but able to see clear fluid behind them, landmarks are present, no purulent effusions or erythema.    Mouth/Throat:     Pharynx: Oropharyngeal exudate and posterior oropharyngeal erythema present.     Comments: Bilateral symmetrical tonsillar hypertrophy with midline uvula, bilateral exudate on the tonsils.  Phonation is normal Eyes:     General: No scleral icterus.       Right eye: No discharge.        Left eye: No discharge.     Conjunctiva/sclera: Conjunctivae normal.     Pupils: Pupils are equal, round, and reactive to light.  Neck:     Musculoskeletal: Normal range of motion and neck supple.     Thyroid: No thyromegaly.     Vascular: No JVD.  Cardiovascular:     Rate and Rhythm: Normal rate and regular rhythm.     Heart sounds: Normal heart sounds. No murmur. No friction rub. No gallop.   Pulmonary:     Effort: Pulmonary effort is normal. No respiratory distress.     Breath sounds: Normal breath sounds. No wheezing or rales.  Abdominal:     General: Bowel sounds are normal. There is no distension.     Palpations: Abdomen is soft. There is no mass.     Tenderness: There is no abdominal tenderness.     Comments: No hepatosplenomegaly  Musculoskeletal: Normal range of motion.        General: No tenderness.  Lymphadenopathy:     Cervical: Cervical adenopathy present.  Skin:    General: Skin is warm and dry.     Findings: No erythema or rash.  Neurological:     Mental Status: She is alert.     Coordination: Coordination normal.  Psychiatric:        Behavior: Behavior normal.      ED Treatments / Results  Labs (all labs ordered are listed, but only abnormal results are displayed) Labs Reviewed - No data to display  EKG None  Radiology No results found.  Procedures Procedures (including critical care time)  Medications Ordered in ED Medications  penicillin  g benzathine (BICILLIN LA) 1200000 UNIT/2ML injection 1.2 Million Units (has no administration in time range)  ondansetron (ZOFRAN-ODT) disintegrating tablet 4 mg (has no administration in time range)     Initial Impression / Assessment and Plan / ED Course  I have reviewed the  triage vital signs and the nursing notes.  Pertinent labs & imaging results that were available during my care of the patient were reviewed by me and considered in my medical decision making (see chart for details).        This patient meets all [facility] criteria, she appears to have an exam condition consistent with strep pharyngitis, will treat with penicillin after discussion with the patient.  She will also get Zofran for her nausea, she does have a slight tachycardia but is likely related to her underlying relative dehydration and low-grade fever.  She is not hypotensive or hypoxic and is willing to try conservative therapy with these medications.  I do not think she needs an IV and she does not want one at this time.  The patient is stable for discharge and is aware of the indications for return.  She will be kept out of work for a couple of days.  Final Clinical Impressions(s) / ED Diagnoses   Final diagnoses:  Strep pharyngitis  Nausea and vomiting, intractability of vomiting not specified, unspecified vomiting type    ED Discharge Orders         Ordered    ondansetron (ZOFRAN ODT) 4 MG disintegrating tablet  Every 8 hours PRN     03/23/18 1550    ibuprofen (ADVIL,MOTRIN) 600 MG tablet  Every 6 hours PRN     03/23/18 1550           Noemi Chapel, MD 03/23/18 1551

## 2018-03-23 NOTE — ED Triage Notes (Signed)
Patient c/o vomiting and diarrhea with intermittent fever, headache, sore throat, ear pain, and body aches. Per patient started feeling bad on Friday with vomiting and diarrhea starting yesterday. Per patient took tylenol this morning at 9am.

## 2018-10-17 ENCOUNTER — Other Ambulatory Visit: Payer: Self-pay | Admitting: *Deleted

## 2018-10-17 DIAGNOSIS — Z20822 Contact with and (suspected) exposure to covid-19: Secondary | ICD-10-CM

## 2018-10-19 LAB — NOVEL CORONAVIRUS, NAA: SARS-CoV-2, NAA: NOT DETECTED

## 2019-08-21 ENCOUNTER — Encounter (HOSPITAL_COMMUNITY): Payer: Self-pay | Admitting: Emergency Medicine

## 2019-08-21 ENCOUNTER — Emergency Department (HOSPITAL_COMMUNITY)
Admission: EM | Admit: 2019-08-21 | Discharge: 2019-08-21 | Disposition: A | Discharge status: Home / Self Care | Payer: 59 | Attending: Emergency Medicine | Admitting: Emergency Medicine

## 2019-08-21 ENCOUNTER — Other Ambulatory Visit: Payer: Self-pay

## 2019-08-21 ENCOUNTER — Other Ambulatory Visit: Payer: Self-pay | Admitting: Physician Assistant

## 2019-08-21 DIAGNOSIS — U071 COVID-19: Secondary | ICD-10-CM

## 2019-08-21 DIAGNOSIS — E119 Type 2 diabetes mellitus without complications: Secondary | ICD-10-CM | POA: Insufficient documentation

## 2019-08-21 DIAGNOSIS — Z87891 Personal history of nicotine dependence: Secondary | ICD-10-CM | POA: Insufficient documentation

## 2019-08-21 DIAGNOSIS — R112 Nausea with vomiting, unspecified: Secondary | ICD-10-CM

## 2019-08-21 LAB — COMPREHENSIVE METABOLIC PANEL
ALT: 22 U/L (ref 0–44)
AST: 25 U/L (ref 15–41)
Albumin: 3.6 g/dL (ref 3.5–5.0)
Alkaline Phosphatase: 77 U/L (ref 38–126)
Anion gap: 18 — ABNORMAL HIGH (ref 5–15)
BUN: 8 mg/dL (ref 6–20)
CO2: 14 mmol/L — ABNORMAL LOW (ref 22–32)
Calcium: 8.7 mg/dL — ABNORMAL LOW (ref 8.9–10.3)
Chloride: 102 mmol/L (ref 98–111)
Creatinine, Ser: 0.57 mg/dL (ref 0.44–1.00)
GFR calc Af Amer: >60 mL/min (ref >60)
GFR calc non Af Amer: >60 mL/min (ref >60)
Glucose, Bld: 314 mg/dL — ABNORMAL HIGH (ref 70–99)
Potassium: 4.2 mmol/L (ref 3.5–5.1)
Sodium: 134 mmol/L — ABNORMAL LOW (ref 135–145)
Total Bilirubin: 1 mg/dL (ref 0.3–1.2)
Total Protein: 7.6 g/dL (ref 6.5–8.1)

## 2019-08-21 LAB — BLOOD GAS, VENOUS
Acid-base deficit: 12.8 mmol/L — ABNORMAL HIGH (ref 0.0–2.0)
Bicarbonate: 15.4 mmol/L — ABNORMAL LOW (ref 20.0–28.0)
FIO2: 21
O2 Saturation: 91.3 %
Patient temperature: 37
pCO2, Ven: 23.8 mmHg — ABNORMAL LOW (ref 44.0–60.0)
pH, Ven: 7.325 (ref 7.250–7.430)
pO2, Ven: 61.6 mmHg — ABNORMAL HIGH (ref 32.0–45.0)

## 2019-08-21 LAB — SARS CORONAVIRUS 2 BY RT PCR (HOSPITAL ORDER, PERFORMED IN ~~LOC~~ HOSPITAL LAB): SARS Coronavirus 2: POSITIVE — AB

## 2019-08-21 LAB — BASIC METABOLIC PANEL
Anion gap: 13 (ref 5–15)
BUN: 7 mg/dL (ref 6–20)
CO2: 12 mmol/L — ABNORMAL LOW (ref 22–32)
Calcium: 7.4 mg/dL — ABNORMAL LOW (ref 8.9–10.3)
Chloride: 111 mmol/L (ref 98–111)
Creatinine, Ser: 0.43 mg/dL — ABNORMAL LOW (ref 0.44–1.00)
GFR calc Af Amer: >60 mL/min (ref >60)
GFR calc non Af Amer: >60 mL/min (ref >60)
Glucose, Bld: 248 mg/dL — ABNORMAL HIGH (ref 70–99)
Potassium: 3.3 mmol/L — ABNORMAL LOW (ref 3.5–5.1)
Sodium: 136 mmol/L (ref 135–145)

## 2019-08-21 LAB — URINALYSIS, ROUTINE W REFLEX MICROSCOPIC
Bacteria, UA: NONE SEEN
Bilirubin Urine: NEGATIVE
Glucose, UA: >=500 mg/dL — AB
Ketones, ur: 80 mg/dL — AB
Leukocytes,Ua: NEGATIVE
Nitrite: NEGATIVE
Protein, ur: 100 mg/dL — AB
Specific Gravity, Urine: 1.026 (ref 1.005–1.030)
pH: 5 (ref 5.0–8.0)

## 2019-08-21 LAB — CBC
HCT: 44.3 % (ref 36.0–46.0)
Hemoglobin: 15.2 g/dL — ABNORMAL HIGH (ref 12.0–15.0)
MCH: 30.6 pg (ref 26.0–34.0)
MCHC: 34.3 g/dL (ref 30.0–36.0)
MCV: 89.3 fL (ref 80.0–100.0)
Platelets: 136 10*3/uL — ABNORMAL LOW (ref 150–400)
RBC: 4.96 MIL/uL (ref 3.87–5.11)
RDW: 12.4 % (ref 11.5–15.5)
WBC: 5.6 10*3/uL (ref 4.0–10.5)
nRBC: 0 % (ref 0.0–0.2)

## 2019-08-21 LAB — LIPASE, BLOOD: Lipase: 23 U/L (ref 11–51)

## 2019-08-21 LAB — POC URINE PREG, ED: Preg Test, Ur: NEGATIVE

## 2019-08-21 MED ORDER — SODIUM CHLORIDE 0.9 % IV SOLN
1200.0000 mg | Freq: Once | INTRAVENOUS | Status: AC
  Administered 2019-08-22: 1200 mg via INTRAVENOUS
  Filled 2019-08-21: qty 1200

## 2019-08-21 MED ORDER — KETOROLAC TROMETHAMINE 30 MG/ML IJ SOLN
30.0000 mg | Freq: Once | INTRAMUSCULAR | Status: AC
  Administered 2019-08-21: 30 mg via INTRAVENOUS
  Filled 2019-08-21: qty 1

## 2019-08-21 MED ORDER — ONDANSETRON 4 MG PO TBDP: 4.0000 mg | ORAL_TABLET | Freq: Three times a day (TID) | ORAL | 0 refills | Status: DC | PRN

## 2019-08-21 MED ORDER — SODIUM CHLORIDE 0.9 % IV BOLUS
1000.0000 mL | Freq: Once | INTRAVENOUS | Status: AC
  Administered 2019-08-21: 1000 mL via INTRAVENOUS

## 2019-08-21 MED ORDER — ACETAMINOPHEN 500 MG PO TABS
1000.0000 mg | ORAL_TABLET | Freq: Once | ORAL | Status: AC
  Administered 2019-08-21: 1000 mg via ORAL
  Filled 2019-08-21: qty 2

## 2019-08-21 MED ORDER — DIPHENOXYLATE-ATROPINE 2.5-0.025 MG PO TABS: 1.0000 | ORAL_TABLET | Freq: Four times a day (QID) | ORAL | 0 refills | Status: DC | PRN

## 2019-08-21 MED ORDER — ONDANSETRON HCL 4 MG/2ML IJ SOLN
4.0000 mg | Freq: Once | INTRAMUSCULAR | Status: AC
  Administered 2019-08-21: 4 mg via INTRAVENOUS
  Filled 2019-08-21: qty 2

## 2019-08-21 NOTE — ED Triage Notes (Signed)
Pt reports emesis and diarrhea that started on Tuesday. Pt COVID positive on Monday.

## 2019-08-21 NOTE — Discharge Instructions (Signed)
You were seen in the emergency department today with Covid symptoms including vomiting and diarrhea.  I have sent in medications to help with the symptoms.  Please try and stay hydrated as best you can at home.  Eating may take several days to come back and become more regular.  I have arranged for you to receive monoclonal antibody treatment at [facility].  They will be in touch to schedule this appointment.  If you develop shortness of breath, chest pain, new/severe symptoms please return to the emergency department immediately.

## 2019-08-21 NOTE — ED Notes (Signed)
Call to lab  Re: repeat labs

## 2019-08-21 NOTE — ED Provider Notes (Signed)
Emergency Department Provider Note   I have reviewed the triage vital signs and the nursing notes.   HISTORY  Chief Complaint Emesis   HPI Samantha Blackburn is a 39 y.o. female with past medical history of diabetes presents to the emergency department with 7 days of vomiting, diarrhea, body aches in the setting of recent COVID-19 diagnosis.  Patient tested positive for Covid on Monday of this week but developed symptoms last Friday.  She denies any significant shortness of breath.  No chest pain.  She is having occasional cough with some runny nose.  Notes a headache.  She states for the last 2 days she has been unable to keep anything down.  She tries to drink broth and eat bland diet but symptoms returned and she has vomiting with diarrhea.  No blood in the emesis or stool.  No recorded fevers.  She has received the first dose of her COVID vaccine last week but is not fully vaccinated.   Past Medical History:  Diagnosis Date  . Diabetes mellitus without complication ([place])   . Diabetes mellitus, new onset ([place]) 11/29/2012  . Obesity 12/01/2012  . Trichimoniasis     Patient Active Problem List   Diagnosis Date Noted  . IUD (intrauterine device) in place 04/19/2016  . History of trichomoniasis 04/19/2016  . 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 Contraception management 11/14/2015  . Chlamydia 10/25/2015  . Obesity 12/01/2012  . Diabetes mellitus, new onset ([place]) 11/29/2012  . Polydipsia 11/29/2012  . Polyuria 11/29/2012  . H/O gestational diabetes mellitus, not currently pregnant 11/29/2012    Past Surgical History:  Procedure Laterality Date  . CHOLECYSTECTOMY      Allergies Patient has no known allergies.  Family History  Problem Relation Age of Onset  . Hypertension Mother   . Diabetes Mother   . Cancer Father        lymphoma, kidney,bladder, liver,prostate  . Diabetes Father   . Heart attack Father   . Diabetes Maternal Grandmother   . Heart attack Maternal  Grandfather   . Stroke Paternal Grandmother     Social History Social History   Tobacco Use  . Smoking status: Former Smoker    Types: Cigarettes  . Smokeless tobacco: Never Used  Vaping Use  . Vaping Use: Never used  Substance Use Topics  . Alcohol use: No  . Drug use: No    Review of Systems  Constitutional: No fever/chills Eyes: No visual changes. ENT: Mild sore throat. Positive congestion.  Cardiovascular: Denies chest pain. Respiratory: Denies shortness of breath. Positive cough.  Gastrointestinal: No abdominal pain. Positive nausea, vomiting, and diarrhea.  No constipation. Genitourinary: Negative for dysuria. Musculoskeletal: Negative for back pain. Skin: Negative for rash. Neurological: Negative for focal weakness or numbness. Positive HA.   10-point ROS otherwise negative.  ____________________________________________   PHYSICAL EXAM:  VITAL SIGNS: ED Triage Vitals  Enc Vitals Group     BP 08/21/19 0637 126/79     Pulse Rate 08/21/19 0637 (!) 101     Resp 08/21/19 0637 18     Temp 08/21/19 0637 98.6 F (37 C)     Temp src --      SpO2 08/21/19 0637 100 %     Weight 08/21/19 0637 165 lb (74.8 kg)     Height 08/21/19 0637 5\' 3"  (1.6 m)   Constitutional: Alert and oriented. Well appearing and in no acute distress. Eyes: Conjunctivae are normal. Head: Atraumatic. Nose: No congestion/rhinnorhea. Mouth/Throat: Mucous membranes  are moist.   Neck: No stridor.  Cardiovascular:. Tachycardia. Good peripheral circulation. Grossly normal heart sounds.   Respiratory: Normal respiratory effort.  No retractions. Lungs CTAB. Gastrointestinal: Soft and nontender. No distention.  Musculoskeletal: No gross deformities of extremities. Neurologic:  Normal speech and language. Skin:  Skin is warm, dry and intact. No rash noted.  ____________________________________________   LABS (all labs ordered are listed, but only abnormal results are displayed)  Labs  Reviewed  SARS CORONAVIRUS 2 BY RT PCR ([place]) - Abnormal; Notable for the following components:      Result Value   SARS Coronavirus 2 POSITIVE (*)    All other components within normal limits  CBC - Abnormal; Notable for the following components:   Hemoglobin 15.2 (*)    Platelets 136 (*)    All other components within normal limits  COMPREHENSIVE METABOLIC PANEL - Abnormal; Notable for the following components:   Sodium 134 (*)    CO2 14 (*)    Glucose, Bld 314 (*)    Calcium 8.7 (*)    Anion gap 18 (*)    All other components within normal limits  URINALYSIS, ROUTINE W REFLEX MICROSCOPIC - Abnormal; Notable for the following components:   Glucose, UA >=500 (*)    Hgb urine dipstick MODERATE (*)    Ketones, ur 80 (*)    Protein, ur 100 (*)    All other components within normal limits  BLOOD GAS, VENOUS - Abnormal; Notable for the following components:   pCO2, Ven 23.8 (*)    pO2, Ven 61.6 (*)    Bicarbonate 15.4 (*)    Acid-base deficit 12.8 (*)    All other components within normal limits  BASIC METABOLIC PANEL - Abnormal; Notable for the following components:   Potassium 3.3 (*)    CO2 12 (*)    Glucose, Bld 248 (*)    Creatinine, Ser 0.43 (*)    Calcium 7.4 (*)    All other components within normal limits  LIPASE, BLOOD  POC URINE PREG, ED   ____________________________________________  RADIOLOGY  None  ____________________________________________   PROCEDURES  Procedure(s) performed:   Procedures  None  ____________________________________________   INITIAL IMPRESSION / ASSESSMENT AND PLAN / ED COURSE  Pertinent labs & imaging results that were available during my care of the patient were reviewed by me and considered in my medical decision making (see chart for details).   Patient with known COVID-19 diagnosis from earlier this week presents with 7 days of Covid symptoms.  Patient has normal oxygen  saturation and is in no respiratory distress.  Lungs are clear.  Plan for labs with many days of vomiting and diarrhea to assess for acute kidney injury and/or electrolyte abnormality.  Plan for IV fluids with some signs of clinical dehydration noted on exam.  I have reached out to the monoclonal antibody infusion team and they will be calling the patient to set up an infusion of monoclonal antibodies for tomorrow.   Labs reviewed. Patient is not IDDM. Anion gap on initial CMP but after IVF the VBG and repeat BMP have normalized. Patient feeling well and tolerating PO. Patient to get monoclonal ab infusion in the AM which was arrange while in the ED. Discussed isolation precautions and ED return precautions.  ____________________________________________  FINAL CLINICAL IMPRESSION(S) / ED DIAGNOSES  Final diagnoses:  COVID-19  Nausea vomiting and diarrhea     MEDICATIONS GIVEN DURING THIS VISIT:  Medications  sodium chloride 0.9 % bolus 1,000 mL (0 mLs Intravenous Stopped 08/21/19 1134)  ondansetron (ZOFRAN) injection 4 mg (4 mg Intravenous Given 08/21/19 0951)  acetaminophen (TYLENOL) tablet 1,000 mg (1,000 mg Oral Given 08/21/19 0950)  ketorolac (TORADOL) 30 MG/ML injection 30 mg (30 mg Intravenous Given 08/21/19 0951)  sodium chloride 0.9 % bolus 1,000 mL (0 mLs Intravenous Stopped 08/21/19 1322)     NEW OUTPATIENT MEDICATIONS STARTED DURING THIS VISIT:  Discharge Medication List as of 08/21/2019 10:32 AM    START taking these medications   Details  diphenoxylate-atropine (LOMOTIL) 2.5-0.025 MG tablet Take 1 tablet by mouth 4 (four) times daily as needed for diarrhea or loose stools., Starting Fri 08/21/2019, Normal        Note:  This document was prepared using Dragon voice recognition software and may include unintentional dictation errors.  Nanda Quinton, MD, [facility]    Xzavior Reinig, [facility], MD 08/22/19 [phone]

## 2019-08-21 NOTE — Progress Notes (Signed)
I connected by phone with Samantha Blackburn on 08/21/2019 at 8:55 AM to discuss the potential use of a new treatment for mild to moderate COVID-19 viral infection in non-hospitalized patients.  This patient is a 39 y.o. female that meets the FDA criteria for Emergency Use Authorization of COVID monoclonal antibody casirivimab/imdevimab.  Has a (+) direct SARS-CoV-2 viral test result  Has mild or moderate COVID-19   Is NOT hospitalized due to COVID-19  Is within 10 days of symptom onset  Has at least one of the high risk factor(s) for progression to severe COVID-19 and/or hospitalization as defined in EUA.  Specific high risk criteria : BMI > 25, DMT22   I have spoken and communicated the following to the patient or parent/caregiver regarding COVID monoclonal antibody treatment:  1. FDA has authorized the emergency use for the treatment of mild to moderate COVID-19 in adults and pediatric patients with positive results of direct SARS-CoV-2 viral testing who are 46 years of age and older weighing at least 40 kg, and who are at high risk for progressing to severe COVID-19 and/or hospitalization.  2. The significant known and potential risks and benefits of COVID monoclonal antibody, and the extent to which such potential risks and benefits are unknown.  3. Information on available alternative treatments and the risks and benefits of those alternatives, including clinical trials.  4. Patients treated with COVID monoclonal antibody should continue to self-isolate and use infection control measures (e.g., wear mask, isolate, social distance, avoid sharing personal items, clean and disinfect "high touch" surfaces, and frequent handwashing) according to CDC guidelines.   5. The patient or parent/caregiver has the option to accept or refuse COVID monoclonal antibody treatment.  After reviewing this information with the patient, The patient agreed to proceed with receiving casirivimab\imdevimab  infusion and will be provided a copy of the Fact sheet prior to receiving the infusion.  Sx onset 7/30. Set up for infusion on 8/7 @ 12:30pm. Directions given to [facility]. Pt is aware that insurance will be charged an infusion fee.   Angelena Form 08/21/2019 8:55 AM

## 2019-08-22 ENCOUNTER — Ambulatory Visit (HOSPITAL_COMMUNITY)
Admission: RE | Admit: 2019-08-22 | Discharge: 2019-08-22 | Disposition: A | Discharge status: Home / Self Care | Payer: 59 | Source: Ambulatory Visit | Attending: Pulmonary Disease | Admitting: Pulmonary Disease

## 2019-08-22 DIAGNOSIS — E119 Type 2 diabetes mellitus without complications: Secondary | ICD-10-CM

## 2019-08-22 DIAGNOSIS — U071 COVID-19: Secondary | ICD-10-CM | POA: Insufficient documentation

## 2019-08-22 MED ORDER — DIPHENHYDRAMINE HCL 50 MG/ML IJ SOLN: 50.0000 mg | Freq: Once | INTRAMUSCULAR | Status: DC | PRN

## 2019-08-22 MED ORDER — ALBUTEROL SULFATE HFA 108 (90 BASE) MCG/ACT IN AERS: 2.0000 | INHALATION_SPRAY | Freq: Once | RESPIRATORY_TRACT | Status: DC | PRN

## 2019-08-22 MED ORDER — FAMOTIDINE IN NACL 20-0.9 MG/50ML-% IV SOLN: 20.0000 mg | Freq: Once | INTRAVENOUS | Status: DC | PRN

## 2019-08-22 MED ORDER — EPINEPHRINE 0.3 MG/0.3ML IJ SOAJ: 0.3000 mg | Freq: Once | INTRAMUSCULAR | Status: DC | PRN

## 2019-08-22 MED ORDER — SODIUM CHLORIDE 0.9 % IV SOLN: INTRAVENOUS | Status: DC | PRN

## 2019-08-22 MED ORDER — METHYLPREDNISOLONE SODIUM SUCC 125 MG IJ SOLR: 125.0000 mg | Freq: Once | INTRAMUSCULAR | Status: DC | PRN

## 2019-08-22 NOTE — Progress Notes (Signed)
  Diagnosis: COVID-19  Physician: Dr. Joya Gaskins  Procedure: Covid Infusion Clinic Med: casirivimab\imdevimab infusion - Provided patient with casirivimab\imdevimab fact sheet for patients, parents and caregivers prior to infusion.  Complications: No immediate complications noted.  Discharge: Discharged home   [place], [place] 08/22/2019

## 2019-09-10 NOTE — Addendum Note (Signed)
Encounter addended by: Janine Ores, RN on: 09/10/2019 12:44 PM  Actions taken: [facility] Discharge Instructions

## 2019-09-10 NOTE — Discharge Instructions (Signed)
10 Things You Can Do to Manage Your COVID-19 Symptoms at Home If you have possible or confirmed COVID-19: 1. Stay home from work and school. And stay away from other public places. If you must go out, avoid using any kind of public transportation, ridesharing, or taxis. 2. Monitor your symptoms carefully. If your symptoms get worse, call your healthcare provider immediately. 3. Get rest and stay hydrated. 4. If you have a medical appointment, call the healthcare provider ahead of time and tell them that you have or may have COVID-19. 5. For medical emergencies, call 911 and notify the dispatch personnel that you have or may have COVID-19. 6. Cover your cough and sneezes with a tissue or use the inside of your elbow. 7. Wash your hands often with soap and water for at least 20 seconds or clean your hands with an alcohol-based hand sanitizer that contains at least 60% alcohol. 8. As much as possible, stay in a specific room and away from other people in your home. Also, you should use a separate bathroom, if available. If you need to be around other people in or outside of the home, wear a mask. 9. Avoid sharing personal items with other people in your household, like dishes, towels, and bedding. 10. Clean all surfaces that are touched often, like counters, tabletops, and doorknobs. Use household cleaning sprays or wipes according to the label instructions. [email or web address] 07/16/2018 This information is not intended to replace advice given to you by your health care provider. Make sure you discuss any questions you have with your health care provider. Document Revised: 12/18/2018 Document Reviewed: 12/18/2018 Elsevier Patient Education  [place].   What types of side effects do monoclonal antibody drugs cause?  Common side effects  In general, the more common side effects caused by monoclonal antibody drugs include: . Allergic reactions, such as hives or itching . Flu-like signs  and symptoms, including chills, fatigue, fever, and muscle aches and pains . Nausea, vomiting . Diarrhea . Skin rashes . Low blood pressure   The CDC is recommending patients who receive monoclonal antibody treatments wait at least 90 days before being vaccinated.  Currently, there are no data on the safety and efficacy of mRNA COVID-19 vaccines in persons who received monoclonal antibodies or convalescent plasma as part of COVID-19 treatment. Based on the estimated half-life of such therapies as well as evidence suggesting that reinfection is uncommon in the 90 days after initial infection, vaccination should be deferred for at least 90 days, as a precautionary measure until additional information becomes available, to avoid interference of the antibody treatment with vaccine-induced immune responses.   Diagnosis: COVID-19  Physician: Dr. Asencion Noble  Procedure: Covid Infusion Clinic Med: casirivimab\imdevimab infusion - Provided patient with casirivimab\imdevimab fact sheet for patients, parents and caregivers prior to infusion.  Complications: No immediate complications noted.  Discharge: Discharged home   Janine Ores 09/10/2019

## 2020-05-09 ENCOUNTER — Other Ambulatory Visit: Payer: Self-pay

## 2020-05-09 ENCOUNTER — Ambulatory Visit (INDEPENDENT_AMBULATORY_CARE_PROVIDER_SITE_OTHER): Payer: 59 | Admitting: Nurse Practitioner

## 2020-05-09 ENCOUNTER — Encounter: Payer: Self-pay | Admitting: Nurse Practitioner

## 2020-05-09 VITALS — BP 179/105 | HR 108 | Temp 98.4°F | Resp 18 | Ht 63.0 in | Wt 169.0 lb

## 2020-05-09 DIAGNOSIS — E1159 Type 2 diabetes mellitus with other circulatory complications: Secondary | ICD-10-CM | POA: Diagnosis not present

## 2020-05-09 DIAGNOSIS — Z7689 Persons encountering health services in other specified circumstances: Secondary | ICD-10-CM

## 2020-05-09 DIAGNOSIS — I152 Hypertension secondary to endocrine disorders: Secondary | ICD-10-CM

## 2020-05-09 DIAGNOSIS — E1165 Type 2 diabetes mellitus with hyperglycemia: Secondary | ICD-10-CM | POA: Insufficient documentation

## 2020-05-09 DIAGNOSIS — Z139 Encounter for screening, unspecified: Secondary | ICD-10-CM | POA: Diagnosis not present

## 2020-05-09 MED ORDER — BLOOD GLUCOSE METER KIT: PACK | 11 refills | Status: DC

## 2020-05-09 MED ORDER — PEN NEEDLES 33G X 4 MM MISC: 1.0000 | Freq: Three times a day (TID) | 3 refills | Status: DC

## 2020-05-09 MED ORDER — LISINOPRIL 10 MG PO TABS: 10.0000 mg | ORAL_TABLET | Freq: Every day | ORAL | 3 refills | Status: DC

## 2020-05-09 MED ORDER — INSULIN ASPART 100 UNIT/ML FLEXPEN: PEN_INJECTOR | SUBCUTANEOUS | 5 refills | Status: DC

## 2020-05-09 MED ORDER — INSULIN GLARGINE 100 UNITS/ML SOLOSTAR PEN: PEN_INJECTOR | SUBCUTANEOUS | 3 refills | Status: DC

## 2020-05-09 NOTE — Addendum Note (Signed)
Addended by: Demetrius Revel on: 05/09/2020 04:07 PM   Modules accepted: Orders

## 2020-05-09 NOTE — Progress Notes (Addendum)
New Patient Office Visit  Subjective:  Patient ID: Samantha Blackburn, female    DOB: Feb 26, 1980  Age: 40 y.o. MRN: [id number]  CC:  Chief Complaint  Patient presents with  . New Patient (Initial Visit)    HPI Samantha Blackburn presents for new patient visit. Transferring care from [place]. [facility] Last physical Last labs on file are form 08/21/19.  She is followed by Dr. Addison Bailey with [facility] at [place].  She states that she has had elevated blood sugars, and her blood sugar was off the scale. She had gestation diabetes, but has not had other diabetes diagnosis.  She checks her BP at her mother's house, and it has been elevated.  She has had polyuria and polydipsia, and she still feels thirsty.  Past Medical History:  Diagnosis Date  . Diabetes mellitus without complication ([place])   . Diabetes mellitus, new onset ([place]) 11/29/2012  . Obesity 12/01/2012  . Trichimoniasis     Past Surgical History:  Procedure Laterality Date  . CHOLECYSTECTOMY      Family History  Problem Relation Age of Onset  . Hypertension Mother   . Diabetes Mother   . Cancer Father        lymphoma, kidney,bladder, liver,prostate  . Diabetes Father   . Heart attack Father   . Diabetes Maternal Grandmother   . Heart attack Maternal Grandfather   . Stroke Paternal Grandmother     Social History   Socioeconomic History  . Marital status: Married    Spouse name: Not on file  . Number of children: 2  . Years of education: Not on file  . Highest education level: Not on file  Occupational History  . Occupation: [employer]    Comment: Med tech supervisior  Tobacco Use  . Smoking status: Former Smoker    Packs/day: 1.00    Years: 10.00    Pack years: 10.00    Types: Cigarettes    Quit date: 05/10/2010    Years since quitting: 10.0  . Smokeless tobacco: Never Used  Vaping Use  . Vaping Use: Never used  Substance and Sexual Activity  . Alcohol use: No  . Drug use: No  .  Sexual activity: Yes    Birth control/protection: Pill  Other Topics Concern  . Not on file  Social History Narrative  . Not on file   Social Determinants of Health   Financial Resource Strain: Not on file  Food Insecurity: Not on file  Transportation Needs: Not on file  Physical Activity: Not on file  Stress: Not on file  Social Connections: Not on file  Intimate Partner Violence: Not on file    ROS Review of Systems  Constitutional: Negative.   Respiratory: Negative.   Cardiovascular: Negative.   Endocrine: Positive for polydipsia and polyuria. Negative for polyphagia.  Musculoskeletal: Negative.   Psychiatric/Behavioral: Negative.     Objective:   Today's Vitals: BP (!) 179/105   Pulse (!) 108   Temp 98.4 F (36.9 C)   Resp 18   Ht '5\' 3"'  (1.6 m)   Wt 169 lb (76.7 kg)   SpO2 96%   BMI 29.94 kg/m   Physical Exam Constitutional:      Appearance: Normal appearance. She is obese.  Cardiovascular:     Rate and Rhythm: Regular rhythm. Tachycardia present.     Pulses: Normal pulses.     Heart sounds: Normal heart sounds.  Pulmonary:     Effort: Pulmonary effort is  normal.     Breath sounds: Normal breath sounds.  Musculoskeletal:        General: Normal range of motion.  Neurological:     Mental Status: She is alert.  Psychiatric:        Mood and Affect: Mood normal.        Thought Content: Thought content normal.        Judgment: Judgment normal.     Assessment & Plan:   Problem List Items Addressed This Visit      Cardiovascular and Mediastinum   Hypertension associated with diabetes ([place])    BP Readings from Last 3 Encounters:  05/09/20 (!) 179/105  08/22/19 108/71  08/21/19 135/74  -Rx. Lisinopril -will re-eval in 2 weeks      Relevant Medications   insulin glargine (LANTUS) 100 unit/mL SOPN   insulin aspart (NOVOLOG) 100 UNIT/ML FlexPen   lisinopril (ZESTRIL) 10 MG tablet     Endocrine   Type II diabetes mellitus, uncontrolled ([place]) -  Primary    -Blood sugar 541 today -starting her on high-dose sliding scale novolog -Rx. Lantus 16 units qhs -Rx. Pen needles and DM supplies -Rx. Lisinopril for HTN and renal protection      Relevant Medications   insulin glargine (LANTUS) 100 unit/mL SOPN   insulin aspart (NOVOLOG) 100 UNIT/ML FlexPen   lisinopril (ZESTRIL) 10 MG tablet   Other Relevant Orders   CBC with Differential/Platelet   CMP14+EGFR   Lipid Panel With LDL/HDL Ratio   Hemoglobin A1c   Microalbumin / creatinine urine ratio     Other   Encounter to establish care    -obtain records       Other Visit Diagnoses    Screening due       Relevant Orders   HCV Ab w/Rflx to Verification      Outpatient Encounter Medications as of 05/09/2020  Medication Sig  . blood glucose meter kit and supplies Dispense based on patient and insurance preference. Use up to four times daily as directed. (FOR ICD-10 E10.9, E11.9).  . insulin aspart (NOVOLOG) 100 UNIT/ML FlexPen Sliding scale TID with meals. Administer 0 units for blood sugar 10-139, 4 units for blood sugar 140-180, 6 units for blood sugar 181-240, 8 units for blood sugar 241-300, 10 units for blood sugar 301-350,  12 units for bloods sugar 351-400,  and call MD and administer 14 units if blood sugar > 400.  Marland Kitchen insulin glargine (LANTUS) 100 unit/mL SOPN Inject 16 units under the skin at bedtime  . Insulin Pen Needle (PEN NEEDLES) 33G X 4 MM MISC 1 each by Does not apply route 4 (four) times daily - after meals and at bedtime.  Marland Kitchen lisinopril (ZESTRIL) 10 MG tablet Take 1 tablet (10 mg total) by mouth daily.  . norgestimate-ethinyl estradiol (ORTHO-CYCLEN) 0.25-35 MG-MCG tablet Take 1 tablet by mouth daily.  . [DISCONTINUED] diphenoxylate-atropine (LOMOTIL) 2.5-0.025 MG tablet Take 1 tablet by mouth 4 (four) times daily as needed for diarrhea or loose stools. (Patient not taking: Reported on 05/09/2020)  . [DISCONTINUED] ibuprofen (ADVIL,MOTRIN) 600 MG tablet Take 1  tablet (600 mg total) by mouth every 6 (six) hours as needed. (Patient not taking: Reported on 05/09/2020)  . [DISCONTINUED] levonorgestrel (MIRENA) 20 MCG/24HR IUD 1 each by Intrauterine route once. (Patient not taking: Reported on 05/09/2020)  . [DISCONTINUED] megestrol (MEGACE) 40 MG tablet Take 1 tablet (40 mg total) by mouth daily. (Patient not taking: Reported on 05/09/2020)  . [DISCONTINUED] metroNIDAZOLE (METROGEL VAGINAL)  0.75 % vaginal gel Place 1 Applicatorful vaginally at bedtime. (Patient not taking: No sig reported)  . [DISCONTINUED] ondansetron (ZOFRAN ODT) 4 MG disintegrating tablet Take 1 tablet (4 mg total) by mouth every 8 (eight) hours as needed. (Patient not taking: Reported on 05/09/2020)   No facility-administered encounter medications on file as of 05/09/2020.    Follow-up: Return in about 2 weeks (around 05/23/2020) for Physical and Diabetes check.   Noreene Larsson, NP

## 2020-05-09 NOTE — Assessment & Plan Note (Signed)
BP Readings from Last 3 Encounters:  05/09/20 (!) 179/105  08/22/19 108/71  08/21/19 135/74  -Rx. Lisinopril -will re-eval in 2 weeks

## 2020-05-09 NOTE — Patient Instructions (Signed)
Please have fasting labs drawn 2-3 days prior to your appointment so we can discuss the results during your office visit.  

## 2020-05-09 NOTE — Assessment & Plan Note (Addendum)
-  Blood sugar 541 today -starting her on high-dose sliding scale novolog -Rx. Lantus 16 units qhs -Rx. Pen needles and DM supplies -Rx. Lisinopril for HTN and renal protection

## 2020-05-09 NOTE — Assessment & Plan Note (Signed)
-  obtain records 

## 2020-05-10 ENCOUNTER — Other Ambulatory Visit: Payer: Self-pay

## 2020-05-10 DIAGNOSIS — E1165 Type 2 diabetes mellitus with hyperglycemia: Secondary | ICD-10-CM

## 2020-05-10 MED ORDER — INSULIN DEGLUDEC 100 UNIT/ML ~~LOC~~ SOPN: 16.0000 [IU] | PEN_INJECTOR | Freq: Every day | SUBCUTANEOUS | Status: DC

## 2020-05-17 ENCOUNTER — Other Ambulatory Visit: Payer: Self-pay

## 2020-05-17 DIAGNOSIS — E1165 Type 2 diabetes mellitus with hyperglycemia: Secondary | ICD-10-CM

## 2020-05-17 MED ORDER — INSULIN DEGLUDEC 100 UNIT/ML ~~LOC~~ SOPN: 16.0000 [IU] | PEN_INJECTOR | Freq: Every day | SUBCUTANEOUS | Status: DC

## 2020-05-18 ENCOUNTER — Other Ambulatory Visit: Payer: Self-pay

## 2020-05-18 ENCOUNTER — Other Ambulatory Visit: Payer: Self-pay | Admitting: Nurse Practitioner

## 2020-05-18 ENCOUNTER — Telehealth: Payer: Self-pay

## 2020-05-18 DIAGNOSIS — E1165 Type 2 diabetes mellitus with hyperglycemia: Secondary | ICD-10-CM

## 2020-05-18 MED ORDER — MOLNUPIRAVIR EUA 200MG CAPSULE
4.0000 | ORAL_CAPSULE | Freq: Two times a day (BID) | ORAL | 0 refills | Status: AC
Start: 2020-05-18 — End: 2020-05-23

## 2020-05-18 MED ORDER — INSULIN DEGLUDEC 100 UNIT/ML ~~LOC~~ SOPN: 16.0000 [IU] | PEN_INJECTOR | Freq: Every day | SUBCUTANEOUS | Status: DC

## 2020-05-18 NOTE — Telephone Encounter (Signed)
Sent mulnupiravir, the COVID antiviral, to Samantha Blackburn... the only place in [place]. [place] that carries it.

## 2020-05-18 NOTE — Telephone Encounter (Signed)
Pt informed we changed medication and sent that in. Pt states she tested positive for Covid this weekend and she was wondering where she is diabetic if there is any medication or infusion recommended.

## 2020-05-18 NOTE — Telephone Encounter (Signed)
Pt informed via voicemail

## 2020-05-18 NOTE — Telephone Encounter (Signed)
Patient called said her insurance will not cover the Lantus. Please contact patient at [phone]. She said must be called into [facility] due to her work at [employer].

## 2020-05-18 NOTE — Telephone Encounter (Signed)
Patient called said [facility] does have the insulin that was just called in.  Patient cb # [phone]

## 2020-05-18 NOTE — Telephone Encounter (Signed)
Rx resent.

## 2020-05-25 ENCOUNTER — Other Ambulatory Visit: Payer: Self-pay | Admitting: Nurse Practitioner

## 2020-05-25 DIAGNOSIS — E1165 Type 2 diabetes mellitus with hyperglycemia: Secondary | ICD-10-CM

## 2020-05-25 NOTE — Progress Notes (Signed)
Her glucose and A1c were significantly elevated. I am getting her in with endocrinology to treat her diabetes.  They have a diabetes educator/nutritionist that can help you make good decisions with respect to food choices and the endocrinologist will get you the best medicines given your blood sugar.

## 2020-05-26 ENCOUNTER — Ambulatory Visit (INDEPENDENT_AMBULATORY_CARE_PROVIDER_SITE_OTHER): Payer: 59 | Admitting: Nurse Practitioner

## 2020-05-26 ENCOUNTER — Other Ambulatory Visit: Payer: Self-pay

## 2020-05-26 ENCOUNTER — Encounter: Payer: Self-pay | Admitting: Nurse Practitioner

## 2020-05-26 VITALS — BP 175/110 | HR 96 | Temp 98.9°F | Resp 20 | Ht 63.5 in | Wt 167.0 lb

## 2020-05-26 DIAGNOSIS — E1159 Type 2 diabetes mellitus with other circulatory complications: Secondary | ICD-10-CM

## 2020-05-26 DIAGNOSIS — E1169 Type 2 diabetes mellitus with other specified complication: Secondary | ICD-10-CM | POA: Diagnosis not present

## 2020-05-26 DIAGNOSIS — Z0001 Encounter for general adult medical examination with abnormal findings: Secondary | ICD-10-CM | POA: Diagnosis not present

## 2020-05-26 DIAGNOSIS — E785 Hyperlipidemia, unspecified: Secondary | ICD-10-CM

## 2020-05-26 DIAGNOSIS — E1165 Type 2 diabetes mellitus with hyperglycemia: Secondary | ICD-10-CM

## 2020-05-26 DIAGNOSIS — I152 Hypertension secondary to endocrine disorders: Secondary | ICD-10-CM

## 2020-05-26 LAB — CMP14+EGFR
ALT: 14 IU/L (ref 0–32)
AST: 14 IU/L (ref 0–40)
Albumin/Globulin Ratio: 1.3 (ref 1.2–2.2)
Albumin: 3.5 g/dL — ABNORMAL LOW (ref 3.8–4.8)
Alkaline Phosphatase: 65 IU/L (ref 44–121)
BUN/Creatinine Ratio: 13 (ref 9–23)
BUN: 6 mg/dL (ref 6–20)
Bilirubin Total: 0.3 mg/dL (ref 0.0–1.2)
CO2: 20 mmol/L (ref 20–29)
Calcium: 8.7 mg/dL (ref 8.7–10.2)
Chloride: 98 mmol/L (ref 96–106)
Creatinine, Ser: 0.47 mg/dL — ABNORMAL LOW (ref 0.57–1.00)
Globulin, Total: 2.8 g/dL (ref 1.5–4.5)
Glucose: 334 mg/dL — ABNORMAL HIGH (ref 65–99)
Potassium: 3.4 mmol/L — ABNORMAL LOW (ref 3.5–5.2)
Sodium: 133 mmol/L — ABNORMAL LOW (ref 134–144)
Total Protein: 6.3 g/dL (ref 6.0–8.5)
eGFR: 125 mL/min/{1.73_m2} (ref >59)

## 2020-05-26 LAB — CBC WITH DIFFERENTIAL/PLATELET
Basophils Absolute: 0 10*3/uL (ref 0.0–0.2)
Basos: 0 %
EOS (ABSOLUTE): 0 10*3/uL (ref 0.0–0.4)
Eos: 1 %
Hematocrit: 38.3 % (ref 34.0–46.6)
Hemoglobin: 13.3 g/dL (ref 11.1–15.9)
Immature Grans (Abs): 0 10*3/uL (ref 0.0–0.1)
Immature Granulocytes: 0 %
Lymphocytes Absolute: 1.7 10*3/uL (ref 0.7–3.1)
Lymphs: 31 %
MCH: 31.2 pg (ref 26.6–33.0)
MCHC: 34.7 g/dL (ref 31.5–35.7)
MCV: 90 fL (ref 79–97)
Monocytes Absolute: 0.3 10*3/uL (ref 0.1–0.9)
Monocytes: 5 %
Neutrophils Absolute: 3.4 10*3/uL (ref 1.4–7.0)
Neutrophils: 63 %
Platelets: 206 10*3/uL (ref 150–450)
RBC: 4.26 x10E6/uL (ref 3.77–5.28)
RDW: 11.9 % (ref 11.7–15.4)
WBC: 5.4 10*3/uL (ref 3.4–10.8)

## 2020-05-26 LAB — HEMOGLOBIN A1C
Est. average glucose Bld gHb Est-mCnc: 295 mg/dL
Hgb A1c MFr Bld: 11.9 % — ABNORMAL HIGH (ref 4.8–5.6)

## 2020-05-26 LAB — HCV INTERPRETATION

## 2020-05-26 LAB — LIPID PANEL WITH LDL/HDL RATIO
Cholesterol, Total: 161 mg/dL (ref 100–199)
HDL: 56 mg/dL (ref >39)
LDL Chol Calc (NIH): 83 mg/dL (ref 0–99)
LDL/HDL Ratio: 1.5 ratio (ref 0.0–3.2)
Triglycerides: 127 mg/dL (ref 0–149)
VLDL Cholesterol Cal: 22 mg/dL (ref 5–40)

## 2020-05-26 LAB — MICROALBUMIN / CREATININE URINE RATIO
Creatinine, Urine: 55.6 mg/dL
Microalb/Creat Ratio: 178 mg/g creat — ABNORMAL HIGH (ref 0–29)
Microalbumin, Urine: 99 ug/mL

## 2020-05-26 LAB — HCV AB W REFLEX TO QUANT PCR: HCV Ab: <0.1 s/co ratio (ref 0.0–0.9)

## 2020-05-26 MED ORDER — TRESIBA FLEXTOUCH 100 UNIT/ML ~~LOC~~ SOPN: 16.0000 [IU] | PEN_INJECTOR | Freq: Every day | SUBCUTANEOUS | 2 refills | Status: DC

## 2020-05-26 MED ORDER — ATORVASTATIN CALCIUM 20 MG PO TABS: 20.0000 mg | ORAL_TABLET | Freq: Every day | ORAL | 3 refills | Status: DC

## 2020-05-26 MED ORDER — INSULIN ASPART 100 UNIT/ML FLEXPEN: PEN_INJECTOR | SUBCUTANEOUS | 1 refills | Status: DC

## 2020-05-26 MED ORDER — LISINOPRIL 20 MG PO TABS: 20.0000 mg | ORAL_TABLET | Freq: Every day | ORAL | 3 refills | Status: DC

## 2020-05-26 NOTE — Assessment & Plan Note (Signed)
-  INCREASE lisinopril BP Readings from Last 2 Encounters:  05/26/20 (!) 175/110  05/09/20 (!) 179/105

## 2020-05-26 NOTE — Assessment & Plan Note (Addendum)
Lab Results  Component Value Date   HGBA1C 11.9 (H) 05/24/2020   -she was referred to endocrinology -resent tresiba to pharmacy -INCREASED novolog -on lisinopril and atorvastatin -foot exam today -discussed diabetic eye exam, and she will set this up

## 2020-05-26 NOTE — Progress Notes (Signed)
Established Patient Office Visit  Subjective:  Patient ID: Samantha Blackburn, female    DOB: December 25, 1980  Age: 40 y.o. MRN: [id number]  CC:  Chief Complaint  Patient presents with  . Annual Exam    HPI Ervin DAIANNA VASQUES presents for physical exam.  Her labs showed significantly elevated glucose and A1c.  She tried metformin in the past, and that did not help at all. She had difficulty getting the tresiba, and she ran out of novolog over the weekend.  Past Medical History:  Diagnosis Date  . Diabetes mellitus without complication ([place])   . Diabetes mellitus, new onset ([place]) 11/29/2012  . Obesity 12/01/2012  . Trichimoniasis     Past Surgical History:  Procedure Laterality Date  . CHOLECYSTECTOMY      Family History  Problem Relation Age of Onset  . Hypertension Mother   . Diabetes Mother   . Cancer Father        lymphoma, kidney,bladder, liver,prostate  . Diabetes Father   . Heart attack Father   . Diabetes Maternal Grandmother   . Heart attack Maternal Grandfather   . Stroke Paternal Grandmother     Social History   Socioeconomic History  . Marital status: Married    Spouse name: Not on file  . Number of children: 2  . Years of education: Not on file  . Highest education level: Not on file  Occupational History  . Occupation: [employer]    Comment: Med tech supervisior  Tobacco Use  . Smoking status: Former Smoker    Packs/day: 1.00    Years: 10.00    Pack years: 10.00    Types: Cigarettes    Quit date: 05/10/2010    Years since quitting: 10.0  . Smokeless tobacco: Never Used  Vaping Use  . Vaping Use: Never used  Substance and Sexual Activity  . Alcohol use: No  . Drug use: No  . Sexual activity: Yes    Birth control/protection: Pill  Other Topics Concern  . Not on file  Social History Narrative  . Not on file   Social Determinants of Health   Financial Resource Strain: Not on file  Food Insecurity: Not on file  Transportation Needs: Not  on file  Physical Activity: Not on file  Stress: Not on file  Social Connections: Not on file  Intimate Partner Violence: Not on file    Outpatient Medications Prior to Visit  Medication Sig Dispense Refill  . blood glucose meter kit and supplies Dispense based on patient and insurance preference. Use up to four times daily as directed. (FOR ICD-10 E10.9, E11.9). 1 each 11  . Insulin Pen Needle (PEN NEEDLES) 33G X 4 MM MISC 1 each by Does not apply route 4 (four) times daily - after meals and at bedtime. 300 each 3  . norgestimate-ethinyl estradiol (ORTHO-CYCLEN) 0.25-35 MG-MCG tablet Take 1 tablet by mouth daily.    . insulin aspart (NOVOLOG) 100 UNIT/ML FlexPen Sliding scale TID with meals. Administer 0 units for blood sugar 10-139, 4 units for blood sugar 140-180, 6 units for blood sugar 181-240, 8 units for blood sugar 241-300, 10 units for blood sugar 301-350,  12 units for bloods sugar 351-400,  and call MD and administer 14 units if blood sugar > 400. 15 mL 5  . lisinopril (ZESTRIL) 10 MG tablet Take 1 tablet (10 mg total) by mouth daily. 90 tablet 3  . insulin degludec (TRESIBA) 100 UNIT/ML FlexTouch Pen 16 Units     .  insulin degludec (TRESIBA) 100 UNIT/ML FlexTouch Pen 16 Units     . insulin degludec (TRESIBA) 100 UNIT/ML FlexTouch Pen 16 Units      No facility-administered medications prior to visit.    No Known Allergies  ROS Review of Systems  Constitutional: Negative.   HENT: Negative.   Eyes: Negative.   Respiratory: Negative.   Cardiovascular: Negative.   Gastrointestinal: Negative.   Endocrine: Positive for polydipsia, polyphagia and polyuria.  Genitourinary: Negative.   Musculoskeletal: Negative.   Skin: Negative.   Neurological: Negative.   Hematological: Negative.   Psychiatric/Behavioral: Negative.       Objective:    Physical Exam Constitutional:      Appearance: Normal appearance.  HENT:     Head: Normocephalic and atraumatic.     Right Ear:  Tympanic membrane, ear canal and external ear normal.     Left Ear: Tympanic membrane, ear canal and external ear normal.     Nose: Nose normal.     Mouth/Throat:     Mouth: Mucous membranes are moist.     Pharynx: Oropharynx is clear.  Eyes:     Extraocular Movements: Extraocular movements intact.     Conjunctiva/sclera: Conjunctivae normal.     Pupils: Pupils are equal, round, and reactive to light.  Cardiovascular:     Rate and Rhythm: Normal rate and regular rhythm.     Pulses: Normal pulses.          Dorsalis pedis pulses are 2+ on the right side and 2+ on the left side.     Heart sounds: Normal heart sounds.  Pulmonary:     Effort: Pulmonary effort is normal.     Breath sounds: Normal breath sounds.  Abdominal:     General: Abdomen is flat. Bowel sounds are normal.     Palpations: Abdomen is soft.  Musculoskeletal:        General: Normal range of motion.     Cervical back: Normal range of motion and neck supple.  Feet:     Right foot:     Protective Sensation: 10 sites tested. 10 sites sensed.     Skin integrity: Skin integrity normal.     Toenail Condition: Right toenails are normal.     Left foot:     Protective Sensation: 10 sites tested. 10 sites sensed.     Skin integrity: Skin integrity normal.     Toenail Condition: Left toenails are normal.  Skin:    General: Skin is warm and dry.     Capillary Refill: Capillary refill takes less than 2 seconds.  Neurological:     General: No focal deficit present.     Mental Status: She is alert and oriented to person, place, and time.     Cranial Nerves: No cranial nerve deficit.     Sensory: No sensory deficit.     Motor: No weakness.     Coordination: Coordination normal.     Gait: Gait normal.  Psychiatric:        Mood and Affect: Mood normal.        Behavior: Behavior normal.        Thought Content: Thought content normal.        Judgment: Judgment normal.     BP (!) 175/110   Pulse 96   Temp 98.9 F (37.2 C)    Resp 20   Ht 5' 3.5" (1.613 m)   Wt 167 lb (75.8 kg)   SpO2 96%   BMI 29.12  kg/m  Wt Readings from Last 3 Encounters:  05/26/20 167 lb (75.8 kg)  05/09/20 169 lb (76.7 kg)  08/21/19 165 lb (74.8 kg)     Health Maintenance Due  Topic Date Due  . PNEUMOCOCCAL POLYSACCHARIDE VACCINE AGE 41-64 HIGH RISK  Never done  . COVID-19 Vaccine (1) Never done  . OPHTHALMOLOGY EXAM  Never done  . TETANUS/TDAP  Never done    There are no preventive care reminders to display for this patient.  Lab Results  Component Value Date   TSH 3.112 11/29/2012   Lab Results  Component Value Date   WBC 5.4 05/24/2020   HGB 13.3 05/24/2020   HCT 38.3 05/24/2020   MCV 90 05/24/2020   PLT 206 05/24/2020   Lab Results  Component Value Date   NA 133 (L) 05/24/2020   K 3.4 (L) 05/24/2020   CO2 20 05/24/2020   GLUCOSE 334 (H) 05/24/2020   BUN 6 05/24/2020   CREATININE 0.47 (L) 05/24/2020   BILITOT 0.3 05/24/2020   ALKPHOS 65 05/24/2020   AST 14 05/24/2020   ALT 14 05/24/2020   PROT 6.3 05/24/2020   ALBUMIN 3.5 (L) 05/24/2020   CALCIUM 8.7 05/24/2020   ANIONGAP 13 08/21/2019   EGFR 125 05/24/2020   Lab Results  Component Value Date   CHOL 161 05/24/2020   Lab Results  Component Value Date   HDL 56 05/24/2020   Lab Results  Component Value Date   LDLCALC 83 05/24/2020   Lab Results  Component Value Date   TRIG 127 05/24/2020   No results found for: CHOLHDL Lab Results  Component Value Date   HGBA1C 11.9 (H) 05/24/2020      Assessment & Plan:   Problem List Items Addressed This Visit      Cardiovascular and Mediastinum   Hypertension associated with diabetes ([place])    -INCREASE lisinopril BP Readings from Last 2 Encounters:  05/26/20 (!) 175/110  05/09/20 (!) 179/105         Relevant Medications   insulin aspart (NOVOLOG) 100 UNIT/ML FlexPen   insulin degludec (TRESIBA FLEXTOUCH) 100 UNIT/ML FlexTouch Pen   lisinopril (ZESTRIL) 20 MG tablet   atorvastatin  (LIPITOR) 20 MG tablet   Other Relevant Orders   CMP14+EGFR   CBC with Differential/Platelet     Endocrine   Type II diabetes mellitus, uncontrolled ([place])    Lab Results  Component Value Date   HGBA1C 11.9 (H) 05/24/2020   -she was referred to endocrinology -resent tresiba to pharmacy -INCREASED novolog -on lisinopril and atorvastatin -foot exam today -discussed diabetic eye exam, and she will set this up      Relevant Medications   insulin aspart (NOVOLOG) 100 UNIT/ML FlexPen   insulin degludec (TRESIBA FLEXTOUCH) 100 UNIT/ML FlexTouch Pen   lisinopril (ZESTRIL) 20 MG tablet   atorvastatin (LIPITOR) 20 MG tablet   Other Relevant Orders   CMP14+EGFR   CBC with Differential/Platelet   Lipid Panel With LDL/HDL Ratio     Other   Abnormal physical evaluation - Primary    -A1c elevated with hyperglycemia        Other Visit Diagnoses    Hyperlipidemia associated with type 2 diabetes mellitus ([facility])       Relevant Medications   insulin aspart (NOVOLOG) 100 UNIT/ML FlexPen   insulin degludec (TRESIBA FLEXTOUCH) 100 UNIT/ML FlexTouch Pen   lisinopril (ZESTRIL) 20 MG tablet   atorvastatin (LIPITOR) 20 MG tablet   Other Relevant Orders   Lipid Panel With  LDL/HDL Ratio      Meds ordered this encounter  Medications  . insulin aspart (NOVOLOG) 100 UNIT/ML FlexPen    Sig: Sliding scale TID with meals. Administer 4 units for blood sugar 70-139, 6 units for blood sugar 140-180, 8 units for blood sugar 181-240, 10 units for blood sugar 241-300, 12 units for blood sugar 301-350,  14 units for bloods sugar 351-400,  and call MD and administer 16 units if blood sugar > 400.    Dispense:  45 mL    Refill:  1    For E11.65; 90-day supply.  . insulin degludec (TRESIBA FLEXTOUCH) 100 UNIT/ML FlexTouch Pen    Sig: Inject 16 Units into the skin at bedtime.    Dispense:  6 mL    Refill:  2  . lisinopril (ZESTRIL) 20 MG tablet    Sig: Take 1 tablet (20 mg total) by mouth daily.     Dispense:  90 tablet    Refill:  3  . atorvastatin (LIPITOR) 20 MG tablet    Sig: Take 1 tablet (20 mg total) by mouth daily.    Dispense:  90 tablet    Refill:  3    Follow-up: Return in about 3 months (around 08/26/2020) for Lab follow-up (HLD with DM).    Noreene Larsson, NP

## 2020-05-26 NOTE — Assessment & Plan Note (Signed)
-  A1c elevated with hyperglycemia

## 2020-05-26 NOTE — Patient Instructions (Signed)
Please have fasting labs drawn 2-3 days prior to your appointment so we can discuss the results during your office visit.  

## 2020-06-10 ENCOUNTER — Ambulatory Visit: Payer: 59 | Admitting: Nurse Practitioner

## 2020-06-21 ENCOUNTER — Ambulatory Visit: Payer: 59 | Admitting: Nurse Practitioner

## 2020-09-01 ENCOUNTER — Ambulatory Visit: Payer: 59 | Admitting: Nurse Practitioner

## 2020-09-26 DIAGNOSIS — E119 Type 2 diabetes mellitus without complications: Secondary | ICD-10-CM | POA: Insufficient documentation

## 2020-09-30 ENCOUNTER — Telehealth: Payer: Self-pay

## 2020-09-30 NOTE — Telephone Encounter (Signed)
Transition Care Management Follow-up Telephone Call Date of discharge and from where: 09/29/20 [facility] How have you been since you were released from the hospital? OK  Any questions or concerns? No  Items Reviewed: Did the pt receive and understand the discharge instructions provided? Yes  Medications obtained and verified? Yes  Other? No  Any new allergies since your discharge? No  Dietary orders reviewed? No Do you have support at home? Yes   Home Care and Equipment/Supplies: Were home health services ordered? yes If so, what is the name of the agency? [facility]  Has the agency set up a time to come to the patient's home? yes Were any new equipment or medical supplies ordered?  No What is the name of the medical supply agency? N/A Were you able to get the supplies/equipment? not applicable Do you have any questions related to the use of the equipment or supplies? No  Functional Questionnaire: (I = Independent and D = Dependent) ADLs: I   Bathing/Dressing- I  Meal Prep- I  Eating- I  Maintaining continence- I  Transferring/Ambulation- I  Managing Meds- I  Follow up appointments reviewed:  PCP Hospital f/u appt confirmed? Yes  Scheduled to see GRAY  on 10/10/20 @ 1:20PM. [facility] f/u appt confirmed? Yes  Scheduled to see UROLOGIST 10/06/20 AND ENDO NOT SET UP YET. Are transportation arrangements needed? No  If their condition worsens, is the pt aware to call PCP or go to the Emergency Dept.? Yes Was the patient provided with contact information for the PCP's office or ED? Yes Was to pt encouraged to call back with questions or concerns? Yes

## 2020-10-06 ENCOUNTER — Ambulatory Visit (INDEPENDENT_AMBULATORY_CARE_PROVIDER_SITE_OTHER): Payer: 59 | Admitting: Urology

## 2020-10-06 ENCOUNTER — Encounter: Payer: Self-pay | Admitting: Urology

## 2020-10-06 ENCOUNTER — Other Ambulatory Visit: Payer: Self-pay

## 2020-10-06 ENCOUNTER — Ambulatory Visit: Payer: 59 | Admitting: Urology

## 2020-10-06 VITALS — BP 161/88 | HR 94

## 2020-10-06 DIAGNOSIS — N2889 Other specified disorders of kidney and ureter: Secondary | ICD-10-CM

## 2020-10-06 DIAGNOSIS — N151 Renal and perinephric abscess: Secondary | ICD-10-CM

## 2020-10-06 LAB — URINALYSIS, ROUTINE W REFLEX MICROSCOPIC
Bilirubin, UA: NEGATIVE
Ketones, UA: NEGATIVE
Leukocytes,UA: NEGATIVE
Nitrite, UA: NEGATIVE
Protein,UA: NEGATIVE
Specific Gravity, UA: 1.01 (ref 1.005–1.030)
Urobilinogen, Ur: 0.2 mg/dL (ref 0.2–1.0)
pH, UA: 6.5 (ref 5.0–7.5)

## 2020-10-06 LAB — MICROSCOPIC EXAMINATION
Epithelial Cells (non renal): >10 /hpf — AB (ref 0–10)
RBC, Urine: >30 /hpf — AB (ref 0–2)
Renal Epithel, UA: NONE SEEN /hpf

## 2020-10-06 NOTE — Progress Notes (Signed)
Urological Symptom Review  Patient is experiencing the following symptoms: Frequent urination Get up at night to urinate Painful intercourse   Review of Systems  Gastrointestinal (upper)  : Negative for upper GI symptoms  Gastrointestinal (lower) : Negative for lower GI symptoms  Constitutional : Negative for symptoms  Skin: Negative for skin symptoms  Eyes: Negative for eye symptoms  Ear/Nose/Throat : Negative for Ear/Nose/Throat symptoms  Hematologic/Lymphatic: Negative for Hematologic/Lymphatic symptoms  Cardiovascular : Negative for cardiovascular symptoms  Respiratory : Negative for respiratory symptoms  Endocrine: Negative for endocrine symptoms  Musculoskeletal: Negative for musculoskeletal symptoms  Neurological: Negative for neurological symptoms  Psychologic: Negative for psychiatric symptoms

## 2020-10-06 NOTE — Progress Notes (Signed)
Assessment: 1. Renal mass, left   2. Renal abscess, left     Plan: I extensively reviewed the patient's records from urgent care and her recent hospitalization.  I reviewed the hospital notes as well as labs and imaging results. I discussed the findings of a probable small renal abscess involving the left kidney with the patient today.  The possible causes of a renal abscess were discussed with the most likely cause being an ascending UTI.  I advised her that this diagnosis is difficult as culture results failed to clearly demonstrate a UTI.  Management of a renal abscess discussed including IV antibiotics and percutaneous drainage.  Since that she appears to be clinically stable at this time, I think it would be reasonable to have her complete the current course of IV antibiotics.  I would then like to repeat imaging with a CT scan later next week.  If the left renal abscess has resolved, no further intervention is indicated.  If it remains unchanged or has increased in size, I would recommend placement of a percutaneous drain by interventional radiology. Complete IV Rocephin. Schedule for CT abdomen with and w/o contrast next week. I will contact her with results of the CT scan and additional recommendations.  A total of 45 minutes was spent reviewing the patient's records, including laboratory results and imaging studies, discussing the findings with the patient and discussing plans for continued management.  Chief Complaint:  Chief Complaint  Patient presents with   renal mass     History of Present Illness:  Samantha Blackburn is a 40 y.o. year old female who is seen in consultation from Noreene Larsson, NP for evaluation of a left renal mass.  She reports onset of nausea, diarrhea, and left flank and back pain.  She was also having some nausea and vomiting.  No fever.  She was seen at urgent care and diagnosed with a UTI on 09/22/2020.  Urine culture showed mixed flora.  She was treated with  Keflex.  She had worsening of her symptoms with continued nausea, vomiting, left-sided back pain, and chills.  She presented to the emergency room on 09/26/2020.  CT imaging showed a 1.8 cm rounded low-density lesion in the midpole cortex of the left kidney which was new in comparison to a study from 07/14/2020.  She was started on IV antibiotics.  Urine and blood cultures showed no growth.  A MRI from 09/27/2020 showed a rounded lesion with a thin rim of subtle hyperintensity measuring 1.5 cm and felt to be suggestive of a small renal abscess.  She continued on IV Rocephin and was discharged on 09/29/2020.  Since discharge she has noted gradual improvement in her symptoms.  No fever or chills.  No dysuria or gross hematuria.  She continues to have intermittent discomfort in the left flank and back area.  She is scheduled to continue on IV Rocephin until early next week. She does report some fatigue. No history of recurrent UTIs.  No history of kidney stones.   Past Medical History:  Past Medical History:  Diagnosis Date   Diabetes mellitus without complication ([place])    Diabetes mellitus, new onset ([place]) 11/29/2012   Obesity 12/01/2012   Trichimoniasis     Past Surgical History:  Past Surgical History:  Procedure Laterality Date   CHOLECYSTECTOMY      Allergies:  No Known Allergies  Family History:  Family History  Problem Relation Age of Onset   Hypertension Mother  Diabetes Mother    Cancer Father        lymphoma, kidney,bladder, liver,prostate   Diabetes Father    Heart attack Father    Diabetes Maternal Grandmother    Heart attack Maternal Grandfather    Stroke Paternal Grandmother     Social History:  Social History   Tobacco Use   Smoking status: Former    Packs/day: 1.00    Years: 10.00    Pack years: 10.00    Types: Cigarettes    Quit date: 05/10/2010    Years since quitting: 10.4   Smokeless tobacco: Never  Vaping Use   Vaping Use: Never used  Substance Use  Topics   Alcohol use: No   Drug use: No    Review of symptoms:  Constitutional:  Negative for unexplained weight loss, night sweats, fever ENT:  Negative for nose bleeds, sinus pain, painful swallowing CV:  Negative for chest pain, shortness of breath, exercise intolerance, palpitations, loss of consciousness Resp:  Negative for cough, wheezing, shortness of breath GI:  Negative for bloody stools; + nausea, vomiting, diarrhea GU:  Positives noted in HPI; otherwise negative for gross hematuria, dysuria, urinary incontinence Neuro:  Negative for seizures, poor balance, limb weakness, slurred speech Psych:  Negative for lack of energy, depression, anxiety Endocrine:  Negative for polydipsia, polyuria, symptoms of hypoglycemia (dizziness, hunger, sweating) Hematologic:  Negative for anemia, purpura, petechia, prolonged or excessive bleeding, use of anticoagulants  Allergic:  Negative for difficulty breathing or choking as a result of exposure to anything; no shellfish allergy; no allergic response (rash/itch) to materials, foods  Physical exam: BP (!) 161/88   Pulse 94  GENERAL APPEARANCE:  Well appearing, well developed, well nourished, NAD HEENT: Atraumatic, Normocephalic, oropharynx clear. NECK: Supple without lymphadenopathy or thyromegaly. LUNGS: Clear to auscultation bilaterally. HEART: Regular Rate and Rhythm without murmurs, gallops, or rubs. ABDOMEN: Soft, non-tender, No Masses. EXTREMITIES: Moves all extremities well.  Without clubbing, cyanosis, or edema. NEUROLOGIC:  Alert and oriented x 3, normal gait, CN II-XII grossly intact.  MENTAL STATUS:  Appropriate. BACK:  Non-tender to palpation.  No CVAT SKIN:  Warm, dry and intact.    I personally reviewed the patient's CT study from 09/26/2020 and the MRI study from 09/27/2020.  These studies demonstrate a 1.8 cm hypodense lesion in the midpole of the left kidney.  There is suggestion of some enhancement of the rim in the MRI  suggestive of a possible small renal abscess.  Results: Results for orders placed or performed in visit on 10/06/20 (from the past 24 hour(s))  Urinalysis, Routine w reflex microscopic     Status: Abnormal   Collection Time: 10/06/20  1:33 PM  Result Value Ref Range   Specific Gravity, UA 1.010 1.005 - 1.030   pH, UA 6.5 5.0 - 7.5   Color, UA Yellow Yellow   Appearance Ur Clear Clear   Leukocytes,UA Negative Negative   Protein,UA Negative Negative/Trace   Glucose, UA 3+ (A) Negative   Ketones, UA Negative Negative   RBC, UA 3+ (A) Negative   Bilirubin, UA Negative Negative   Urobilinogen, Ur 0.2 0.2 - 1.0 mg/dL   Nitrite, UA Negative Negative   Microscopic Examination See below:    Narrative   Performed at:  [facility] [address] [place], [place]  [id number] Lab Director: Mina Marble [place], Phone:  [phone]  Microscopic Examination     Status: Abnormal   Collection Time: 10/06/20  1:33 PM  Urine  Result Value Ref Range   WBC, UA 0-5 0 - 5 /hpf   RBC >30 (A) 0 - 2 /hpf   Epithelial Cells (non renal) >10 (A) 0 - 10 /hpf   Renal Epithel, UA None seen None seen /hpf   Bacteria, UA Few None seen/Few   Narrative   Performed at:  [facility] [address], [place], [place]  [id number] Lab Director: [place], Phone:  [phone]

## 2020-10-10 ENCOUNTER — Telehealth: Payer: Self-pay

## 2020-10-10 NOTE — Telephone Encounter (Signed)
Patient called office today after home health nurse came to house to administer IV antibiotics.  Reports her BP is 186/123 and has noticed bleeding when she goes to the bathroom.  Patient instructed to go to ER for evaluation of blood pressure and new onset of bleeding. Patient voiced understanding and will go to [facility] ER.

## 2020-10-11 NOTE — Telephone Encounter (Signed)
Agree with plan 

## 2020-10-12 ENCOUNTER — Ambulatory Visit (HOSPITAL_COMMUNITY): Admission: RE | Admit: 2020-10-12 | Payer: 59 | Source: Ambulatory Visit

## 2020-10-12 ENCOUNTER — Ambulatory Visit (INDEPENDENT_AMBULATORY_CARE_PROVIDER_SITE_OTHER): Payer: 59 | Admitting: Nurse Practitioner

## 2020-10-12 ENCOUNTER — Other Ambulatory Visit: Payer: Self-pay

## 2020-10-12 ENCOUNTER — Encounter: Payer: Self-pay | Admitting: Nurse Practitioner

## 2020-10-12 VITALS — BP 169/105 | HR 104 | Temp 97.8°F | Ht 63.0 in | Wt 152.0 lb

## 2020-10-12 DIAGNOSIS — I152 Hypertension secondary to endocrine disorders: Secondary | ICD-10-CM | POA: Diagnosis not present

## 2020-10-12 DIAGNOSIS — E1165 Type 2 diabetes mellitus with hyperglycemia: Secondary | ICD-10-CM

## 2020-10-12 DIAGNOSIS — E1159 Type 2 diabetes mellitus with other circulatory complications: Secondary | ICD-10-CM | POA: Diagnosis not present

## 2020-10-12 DIAGNOSIS — N151 Renal and perinephric abscess: Secondary | ICD-10-CM

## 2020-10-12 MED ORDER — INSULIN ASPART 100 UNIT/ML FLEXPEN: PEN_INJECTOR | SUBCUTANEOUS | 1 refills | Status: DC

## 2020-10-12 MED ORDER — OLMESARTAN MEDOXOMIL 40 MG PO TABS: 40.0000 mg | ORAL_TABLET | Freq: Every day | ORAL | 1 refills | Status: DC

## 2020-10-12 NOTE — Assessment & Plan Note (Signed)
-  Blood sugar has been in the 230s today and ahs been elevated since she was hospitalized on 9/12 -INCREASE sliding scale by 3 units

## 2020-10-12 NOTE — Assessment & Plan Note (Signed)
BP Readings from Last 3 Encounters:  10/12/20 (!) 169/105  10/06/20 (!) 161/88  05/26/20 (!) 175/110   -STOP lisinopril -Rx. olmesartan

## 2020-10-12 NOTE — Progress Notes (Signed)
Acute Office Visit  Subjective:    Patient ID: Samantha Blackburn, female    DOB: August 31, 1980, 40 y.o.   MRN: [id number]  Chief Complaint  Patient presents with   [facility] follow up blood pressure has been running high     HPI Patient is in today for follow-up for kidney abscess.  From her discharge note, "Samantha Blackburn is a 40 y.o. female that presented to [facility] and was admitted for Intractable nausea and vomiting on 09/26/2020 4:37 PM .  Patient presented to the ED on 9/12 due to 1 week history of intractable nausea and vomiting. Had been diagnosed with UTI at a walk-in clinic last week and placed on Keflex. Upon arrival at the ED the patient's potassium was 2.1 and glucose greater than 400. She was admitted for treatment and management. Was given IV fluid hydration and potassium/magnesium were aggressively repleted. Her A1c was 12.5. Glucose managed with sliding scale insulin and basal insulin.  Patient was found to have a left renal lesion, 1.8 cm. IV Rocephin was initiated and MRI with contrast was performed confirming a renal lesion and left renal cortex with imaging characteristics suggestive of a small renal abscess. Blood cultures negative. Sed rate greater than 140, elevated. Urine culture pending.  PICC line was placed today and patient will have 2 weeks of IV Rocephin therapy daily. She has been referred to urology. She is ready to discharge home as nausea and vomiting have resolved and she is tolerating a regular diet. Discharge plan discussed with the patient and also with Dr. Leory Plowman who agrees. Discharge home with IV antibiotics organized by case management....  START taking these medications  cefTRIAXone 2 g in sodium chloride 0.9 % 0.9 % 100 mL IVPB Infuse 2 g into a venous catheter daily for 12 days."  Her blood sugar was 230 today.  Her blood pressure has been elevated 180s/120s. She has got her SBP in the 140s.  Past Medical  History:  Diagnosis Date   Diabetes mellitus without complication ([place])    Diabetes mellitus, new onset ([place]) 11/29/2012   Obesity 12/01/2012   Trichimoniasis     Past Surgical History:  Procedure Laterality Date   CHOLECYSTECTOMY      Family History  Problem Relation Age of Onset   Hypertension Mother    Diabetes Mother    Cancer Father        lymphoma, kidney,bladder, liver,prostate   Diabetes Father    Heart attack Father    Diabetes Maternal Grandmother    Heart attack Maternal Grandfather    Stroke Paternal Grandmother     Social History   Socioeconomic History   Marital status: Married    Spouse name: Not on file   Number of children: 2   Years of education: Not on file   Highest education level: Not on file  Occupational History   Occupation: [employer]    Comment: Med tech supervisior  Tobacco Use   Smoking status: Former    Packs/day: 1.00    Years: 10.00    Pack years: 10.00    Types: Cigarettes    Quit date: 05/10/2010    Years since quitting: 10.4   Smokeless tobacco: Never  Vaping Use   Vaping Use: Never used  Substance and Sexual Activity   Alcohol use: No   Drug use: No   Sexual activity: Yes    Birth control/protection: Pill  Other Topics Concern   Not  on file  Social History Narrative   Not on file   Social Determinants of Health   Financial Resource Strain: Not on file  Food Insecurity: Not on file  Transportation Needs: Not on file  Physical Activity: Not on file  Stress: Not on file  Social Connections: Not on file  Intimate Partner Violence: Not on file    Outpatient Medications Prior to Visit  Medication Sig Dispense Refill   atorvastatin (LIPITOR) 20 MG tablet Take 1 tablet (20 mg total) by mouth daily. 90 tablet 3   blood glucose meter kit and supplies Dispense based on patient and insurance preference. Use up to four times daily as directed. (FOR ICD-10 E10.9, E11.9). 1 each 11   insulin degludec (TRESIBA FLEXTOUCH) 100  UNIT/ML FlexTouch Pen Inject 16 Units into the skin at bedtime. 6 mL 2   Insulin Pen Needle (PEN NEEDLES) 33G X 4 MM MISC 1 each by Does not apply route 4 (four) times daily - after meals and at bedtime. 300 each 3   norgestimate-ethinyl estradiol (ORTHO-CYCLEN) 0.25-35 MG-MCG tablet Take 1 tablet by mouth daily.     insulin aspart (NOVOLOG) 100 UNIT/ML FlexPen Sliding scale TID with meals. Administer 4 units for blood sugar 70-139, 6 units for blood sugar 140-180, 8 units for blood sugar 181-240, 10 units for blood sugar 241-300, 12 units for blood sugar 301-350,  14 units for bloods sugar 351-400,  and call MD and administer 16 units if blood sugar > 400. 45 mL 1   lisinopril (ZESTRIL) 20 MG tablet Take 1 tablet (20 mg total) by mouth daily. 90 tablet 3   No facility-administered medications prior to visit.    Allergies  Allergen Reactions   Morphine     Review of Systems  Constitutional: Negative.   Respiratory: Negative.    Cardiovascular: Negative.   Musculoskeletal:  Positive for back pain.  Psychiatric/Behavioral: Negative.        Objective:    Physical Exam Constitutional:      Appearance: Normal appearance.  Cardiovascular:     Rate and Rhythm: Normal rate and regular rhythm.     Pulses: Normal pulses.     Heart sounds: Normal heart sounds.  Pulmonary:     Effort: Pulmonary effort is normal.     Breath sounds: Normal breath sounds.  Abdominal:     General: Abdomen is flat.     Palpations: Abdomen is soft.     Tenderness: There is left CVA tenderness.  Musculoskeletal:        General: Normal range of motion.  Neurological:     Mental Status: She is alert.  Psychiatric:        Mood and Affect: Mood normal.        Behavior: Behavior normal.        Thought Content: Thought content normal.        Judgment: Judgment normal.    BP (!) 169/105 (BP Location: Left Arm, Patient Position: Sitting, Cuff Size: Normal)   Pulse (!) 104   Temp 97.8 F (36.6 C) (Oral)   Ht  '5\' 3"'  (1.6 m)   Wt 152 lb 0.6 oz (69 kg)   SpO2 98%   BMI 26.93 kg/m  Wt Readings from Last 3 Encounters:  10/12/20 152 lb 0.6 oz (69 kg)  05/26/20 167 lb (75.8 kg)  05/09/20 169 lb (76.7 kg)    Health Maintenance Due  Topic Date Due   COVID-19 Vaccine (1) Never done   OPHTHALMOLOGY  EXAM  Never done   TETANUS/TDAP  Never done   INFLUENZA VACCINE  08/15/2020    There are no preventive care reminders to display for this patient.   Lab Results  Component Value Date   TSH 3.112 11/29/2012   Lab Results  Component Value Date   WBC 5.4 05/24/2020   HGB 13.3 05/24/2020   HCT 38.3 05/24/2020   MCV 90 05/24/2020   PLT 206 05/24/2020   Lab Results  Component Value Date   NA 133 (L) 05/24/2020   K 3.4 (L) 05/24/2020   CO2 20 05/24/2020   GLUCOSE 334 (H) 05/24/2020   BUN 6 05/24/2020   CREATININE 0.47 (L) 05/24/2020   BILITOT 0.3 05/24/2020   ALKPHOS 65 05/24/2020   AST 14 05/24/2020   ALT 14 05/24/2020   PROT 6.3 05/24/2020   ALBUMIN 3.5 (L) 05/24/2020   CALCIUM 8.7 05/24/2020   ANIONGAP 13 08/21/2019   EGFR 125 05/24/2020   Lab Results  Component Value Date   CHOL 161 05/24/2020   Lab Results  Component Value Date   HDL 56 05/24/2020   Lab Results  Component Value Date   LDLCALC 83 05/24/2020   Lab Results  Component Value Date   TRIG 127 05/24/2020   No results found for: [facility] Lab Results  Component Value Date   HGBA1C 11.9 (H) 05/24/2020       Assessment & Plan:   Problem List Items Addressed This Visit       Cardiovascular and Mediastinum   Hypertension associated with diabetes ([place])    BP Readings from Last 3 Encounters:  10/12/20 (!) 169/105  10/06/20 (!) 161/88  05/26/20 (!) 175/110  -STOP lisinopril -Rx. olmesartan      Relevant Medications   olmesartan (BENICAR) 40 MG tablet   insulin aspart (NOVOLOG) 100 UNIT/ML FlexPen     Endocrine   Type II diabetes mellitus, uncontrolled ([facility])    -Blood sugar has been in the 230s  today and ahs been elevated since she was hospitalized on 9/12 -INCREASE sliding scale by 3 units      Relevant Medications   olmesartan (BENICAR) 40 MG tablet   insulin aspart (NOVOLOG) 100 UNIT/ML FlexPen     Genitourinary   Renal abscess - Primary    -followed by Dr. Felipa Eth; f/u with him as scheduled -she is still taking IV ceftriaxone       Relevant Orders   Basic metabolic panel   CBC with Differential/Platelet     Meds ordered this encounter  Medications   olmesartan (BENICAR) 40 MG tablet    Sig: Take 1 tablet (40 mg total) by mouth daily.    Dispense:  90 tablet    Refill:  1    STOP lisinopril   insulin aspart (NOVOLOG) 100 UNIT/ML FlexPen    Sig: Sliding scale TID with meals. Administer 7 units for blood sugar 70-139, 9 units for blood sugar 140-180, 11 units for blood sugar 181-240, 13 units for blood sugar 241-300, 15 units for blood sugar 301-350,  17 units for bloods sugar 351-400,  and call MD and administer 19 units if blood sugar > 400.    Dispense:  45 mL    Refill:  1    For E11.65; 90-day supply.     Noreene Larsson, NP

## 2020-10-12 NOTE — Addendum Note (Signed)
Addended by: Dorisann Frames on: 10/12/2020 03:03 PM   Modules accepted: Orders

## 2020-10-12 NOTE — Assessment & Plan Note (Signed)
-  followed by Dr. Felipa Eth; f/u with him as scheduled -she is still taking IV ceftriaxone

## 2020-10-12 NOTE — Addendum Note (Signed)
Addended by: Dorisann Frames on: 10/12/2020 02:59 PM   Modules accepted: Orders

## 2020-10-12 NOTE — Patient Instructions (Signed)
Please have labs drawn today

## 2020-10-13 ENCOUNTER — Other Ambulatory Visit: Payer: Self-pay | Admitting: Nurse Practitioner

## 2020-10-13 LAB — CBC WITH DIFFERENTIAL/PLATELET
Basophils Absolute: 0 10*3/uL (ref 0.0–0.2)
Basos: 1 %
EOS (ABSOLUTE): 0 10*3/uL (ref 0.0–0.4)
Eos: 1 %
Hematocrit: 39.2 % (ref 34.0–46.6)
Hemoglobin: 13.2 g/dL (ref 11.1–15.9)
Immature Grans (Abs): 0 10*3/uL (ref 0.0–0.1)
Immature Granulocytes: 1 %
Lymphocytes Absolute: 2.1 10*3/uL (ref 0.7–3.1)
Lymphs: 36 %
MCH: 30.8 pg (ref 26.6–33.0)
MCHC: 33.7 g/dL (ref 31.5–35.7)
MCV: 91 fL (ref 79–97)
Monocytes Absolute: 0.5 10*3/uL (ref 0.1–0.9)
Monocytes: 8 %
Neutrophils Absolute: 3.2 10*3/uL (ref 1.4–7.0)
Neutrophils: 53 %
Platelets: 303 10*3/uL (ref 150–450)
RBC: 4.29 x10E6/uL (ref 3.77–5.28)
RDW: 13.1 % (ref 11.7–15.4)
WBC: 5.9 10*3/uL (ref 3.4–10.8)

## 2020-10-13 LAB — BASIC METABOLIC PANEL
BUN/Creatinine Ratio: 14 (ref 9–23)
BUN: 8 mg/dL (ref 6–20)
CO2: 19 mmol/L — ABNORMAL LOW (ref 20–29)
Calcium: 9.6 mg/dL (ref 8.7–10.2)
Chloride: 91 mmol/L — ABNORMAL LOW (ref 96–106)
Creatinine, Ser: 0.58 mg/dL (ref 0.57–1.00)
Glucose: 621 mg/dL (ref 70–99)
Potassium: 4.6 mmol/L (ref 3.5–5.2)
Sodium: 132 mmol/L — ABNORMAL LOW (ref 134–144)
eGFR: 118 mL/min/{1.73_m2} (ref >59)

## 2020-10-13 NOTE — Progress Notes (Signed)
Called this AM by the call service for a critical lab value -- glucose = 621.  I called the patient and let her know that her blood sugar was elevated at 621 and she would need to return to the hospital. She was alert and oriented and planned on checking her blood sugar giving herself some insulin (according to the sliding scale) before going to the hospital.

## 2020-10-13 NOTE — Progress Notes (Signed)
I called the patient just before 6 this AM. Will you call her back and see if she went to the hospital. If not, I will need to talk to her again.

## 2020-10-14 NOTE — Telephone Encounter (Signed)
Received a secure chat with the following message  MRN:  [id number] Samantha Blackburn called advising [facility] called her regarding a CT and Surgery. She advised she was scheduled for surgery on Oct 20th but  Dr.  Felipa Eth advised he was going to label it as stat so that it could be sooner. She advised there was an issue with insurance and they canceled the surgery and she had questions about what she needed to do    Called patient- no answer. Left message to return call to discuss

## 2020-10-17 ENCOUNTER — Other Ambulatory Visit: Payer: Self-pay

## 2020-10-17 ENCOUNTER — Ambulatory Visit (HOSPITAL_COMMUNITY): Admission: RE | Admit: 2020-10-17 | Payer: 59 | Source: Ambulatory Visit

## 2020-10-17 ENCOUNTER — Ambulatory Visit (HOSPITAL_COMMUNITY)
Admission: RE | Admit: 2020-10-17 | Discharge: 2020-10-17 | Disposition: A | Discharge status: Home / Self Care | Payer: 59 | Source: Ambulatory Visit | Attending: Urology | Admitting: Urology

## 2020-10-17 ENCOUNTER — Ambulatory Visit (HOSPITAL_COMMUNITY): Payer: 59

## 2020-10-17 DIAGNOSIS — N151 Renal and perinephric abscess: Secondary | ICD-10-CM | POA: Insufficient documentation

## 2020-10-17 DIAGNOSIS — N2889 Other specified disorders of kidney and ureter: Secondary | ICD-10-CM | POA: Insufficient documentation

## 2020-10-17 MED ORDER — IOHEXOL 300 MG/ML  SOLN
100.0000 mL | Freq: Once | INTRAMUSCULAR | Status: AC | PRN
  Administered 2020-10-17: 100 mL via INTRAVENOUS

## 2020-10-18 NOTE — Telephone Encounter (Signed)
Referrals dept at our office will obtain prior authorization that is needed to proceed.

## 2020-10-19 ENCOUNTER — Telehealth: Payer: Self-pay

## 2020-10-19 NOTE — Telephone Encounter (Signed)
-----   Message from Primus Bravo, MD sent at 10/18/2020  4:04 PM EDT ----- Please notify Samantha Blackburn that her CT shows a decrease in size of the left renal abscess.  Given these findings, I do not think that she will need to undergo percutaneous drain placement at this time.  I do not think that further antibiotics are necessary once she has completed the prescribed course of Rocephin.  Consequently, her PICC line could be removed once her antibiotics have been completed. I would like for her to follow-up in approximately 4 weeks.

## 2020-10-19 NOTE — Telephone Encounter (Signed)
Patient called and notified of CT results.  Patient has completed antibiotic course. Home Health is to remove picc line this week per patient.   F/u appt scheduled with pt.

## 2020-10-20 ENCOUNTER — Encounter: Payer: Self-pay | Admitting: Nurse Practitioner

## 2020-10-20 ENCOUNTER — Ambulatory Visit (INDEPENDENT_AMBULATORY_CARE_PROVIDER_SITE_OTHER): Payer: 59 | Admitting: Nurse Practitioner

## 2020-10-20 ENCOUNTER — Other Ambulatory Visit: Payer: Self-pay

## 2020-10-20 VITALS — BP 140/87 | HR 96 | Ht 63.0 in | Wt 154.0 lb

## 2020-10-20 DIAGNOSIS — E1165 Type 2 diabetes mellitus with hyperglycemia: Secondary | ICD-10-CM

## 2020-10-20 DIAGNOSIS — Z794 Long term (current) use of insulin: Secondary | ICD-10-CM

## 2020-10-20 DIAGNOSIS — I152 Hypertension secondary to endocrine disorders: Secondary | ICD-10-CM | POA: Diagnosis not present

## 2020-10-20 DIAGNOSIS — E1159 Type 2 diabetes mellitus with other circulatory complications: Secondary | ICD-10-CM

## 2020-10-20 MED ORDER — TRESIBA FLEXTOUCH 100 UNIT/ML ~~LOC~~ SOPN: 24.0000 [IU] | PEN_INJECTOR | Freq: Every day | SUBCUTANEOUS | 2 refills | Status: DC

## 2020-10-20 NOTE — Patient Instructions (Signed)
Please have fasting labs drawn 2-3 days prior to your appointment so we can discuss the results during your office visit.  

## 2020-10-20 NOTE — Assessment & Plan Note (Signed)
-  INCREASE tresiba to 24 units from 16 -continue current sliding scale -may consider SGLT-1 or GLP-1 with next set of labs

## 2020-10-20 NOTE — Assessment & Plan Note (Signed)
BP Readings from Last 3 Encounters:  10/20/20 140/87  10/12/20 (!) 169/105  10/06/20 (!) 161/88   -BP greatly improved -continue current meds

## 2020-10-20 NOTE — Progress Notes (Signed)
Acute Office Visit  Subjective:    Patient ID: Samantha Blackburn, female    DOB: 1980-12-08, 40 y.o.   MRN: [id number]  Chief Complaint  Patient presents with   Follow-up    1 week follow up    HPI Patient is in today for follow-up for HTN, DM, and renal abscess.  She is followed by Dr. Felipa Eth for renal abscess and was taking IV ceftriaxone at the time of her last OV.  For HTN, we stopped lisinopril and started her on olmesartan 40 mg. BP is great today. She checks her BP at home, and SBP has been in the 120s-140s.  For DM, we increased her sliding scale by 3 units at the last OV. CBG was in 170s this AM and runs a little high maybe 240s in the evening.  Past Medical History:  Diagnosis Date   Diabetes mellitus without complication ([place])    Diabetes mellitus, new onset ([place]) 11/29/2012   Obesity 12/01/2012   Trichimoniasis     Past Surgical History:  Procedure Laterality Date   CHOLECYSTECTOMY      Family History  Problem Relation Age of Onset   Hypertension Mother    Diabetes Mother    Cancer Father        lymphoma, kidney,bladder, liver,prostate   Diabetes Father    Heart attack Father    Diabetes Maternal Grandmother    Heart attack Maternal Grandfather    Stroke Paternal Grandmother     Social History   Socioeconomic History   Marital status: Married    Spouse name: Not on file   Number of children: 2   Years of education: Not on file   Highest education level: Not on file  Occupational History   Occupation: [employer]    Comment: Med tech supervisior  Tobacco Use   Smoking status: Former    Packs/day: 1.00    Years: 10.00    Pack years: 10.00    Types: Cigarettes    Quit date: 05/10/2010    Years since quitting: 10.4   Smokeless tobacco: Never  Vaping Use   Vaping Use: Never used  Substance and Sexual Activity   Alcohol use: No   Drug use: No   Sexual activity: Yes    Birth control/protection: Pill  Other Topics Concern   Not on file   Social History Narrative   Not on file   Social Determinants of Health   Financial Resource Strain: Not on file  Food Insecurity: Not on file  Transportation Needs: Not on file  Physical Activity: Not on file  Stress: Not on file  Social Connections: Not on file  Intimate Partner Violence: Not on file    Outpatient Medications Prior to Visit  Medication Sig Dispense Refill   atorvastatin (LIPITOR) 20 MG tablet Take 1 tablet (20 mg total) by mouth daily. 90 tablet 3   blood glucose meter kit and supplies Dispense based on patient and insurance preference. Use up to four times daily as directed. (FOR ICD-10 E10.9, E11.9). 1 each 11   insulin aspart (NOVOLOG) 100 UNIT/ML FlexPen Sliding scale TID with meals. Administer 7 units for blood sugar 70-139, 9 units for blood sugar 140-180, 11 units for blood sugar 181-240, 13 units for blood sugar 241-300, 15 units for blood sugar 301-350,  17 units for bloods sugar 351-400,  and call MD and administer 19 units if blood sugar > 400. 45 mL 1   Insulin Pen Needle (PEN NEEDLES) 33G X  4 MM MISC 1 each by Does not apply route 4 (four) times daily - after meals and at bedtime. 300 each 3   norgestimate-ethinyl estradiol (ORTHO-CYCLEN) 0.25-35 MG-MCG tablet Take 1 tablet by mouth daily.     olmesartan (BENICAR) 40 MG tablet Take 1 tablet (40 mg total) by mouth daily. 90 tablet 1   insulin degludec (TRESIBA FLEXTOUCH) 100 UNIT/ML FlexTouch Pen Inject 16 Units into the skin at bedtime. 6 mL 2   No facility-administered medications prior to visit.    Allergies  Allergen Reactions   Morphine     Review of Systems  Constitutional: Negative.   Respiratory: Negative.    Cardiovascular: Negative.   Musculoskeletal: Negative.   Psychiatric/Behavioral: Negative.        Objective:    Physical Exam Constitutional:      Appearance: Normal appearance.  Cardiovascular:     Rate and Rhythm: Normal rate and regular rhythm.     Pulses: Normal pulses.      Heart sounds: Normal heart sounds.  Pulmonary:     Effort: Pulmonary effort is normal.     Breath sounds: Normal breath sounds.  Musculoskeletal:        General: Normal range of motion.  Neurological:     Mental Status: She is alert.  Psychiatric:        Mood and Affect: Mood normal.        Behavior: Behavior normal.        Thought Content: Thought content normal.        Judgment: Judgment normal.    BP 140/87 (BP Location: Right Arm, Patient Position: Sitting, Cuff Size: Large)   Pulse 96   Ht '5\' 3"'  (1.6 m)   Wt 154 lb (69.9 kg)   LMP 10/17/2020 Comment: birth control  SpO2 98%   BMI 27.28 kg/m  Wt Readings from Last 3 Encounters:  10/20/20 154 lb (69.9 kg)  10/12/20 152 lb 0.6 oz (69 kg)  05/26/20 167 lb (75.8 kg)    Health Maintenance Due  Topic Date Due   COVID-19 Vaccine (1) Never done   OPHTHALMOLOGY EXAM  Never done   TETANUS/TDAP  Never done   INFLUENZA VACCINE  08/15/2020    There are no preventive care reminders to display for this patient.   Lab Results  Component Value Date   TSH 3.112 11/29/2012   Lab Results  Component Value Date   WBC 5.9 10/12/2020   HGB 13.2 10/12/2020   HCT 39.2 10/12/2020   MCV 91 10/12/2020   PLT 303 10/12/2020   Lab Results  Component Value Date   NA 132 (L) 10/12/2020   K 4.6 10/12/2020   CO2 19 (L) 10/12/2020   GLUCOSE 621 (HH) 10/12/2020   BUN 8 10/12/2020   CREATININE 0.58 10/12/2020   BILITOT 0.3 05/24/2020   ALKPHOS 65 05/24/2020   AST 14 05/24/2020   ALT 14 05/24/2020   PROT 6.3 05/24/2020   ALBUMIN 3.5 (L) 05/24/2020   CALCIUM 9.6 10/12/2020   ANIONGAP 13 08/21/2019   EGFR 118 10/12/2020   Lab Results  Component Value Date   CHOL 161 05/24/2020   Lab Results  Component Value Date   HDL 56 05/24/2020   Lab Results  Component Value Date   LDLCALC 83 05/24/2020   Lab Results  Component Value Date   TRIG 127 05/24/2020   No results found for: [facility] Lab Results  Component Value Date    HGBA1C 11.9 (H) 05/24/2020  Assessment & Plan:   Problem List Items Addressed This Visit       Cardiovascular and Mediastinum   Hypertension associated with diabetes ([place])    BP Readings from Last 3 Encounters:  10/20/20 140/87  10/12/20 (!) 169/105  10/06/20 (!) 161/88  -BP greatly improved -continue current meds      Relevant Medications   insulin degludec (TRESIBA FLEXTOUCH) 100 UNIT/ML FlexTouch Pen   Other Relevant Orders   CBC with Differential/Platelet   CMP14+EGFR   Lipid Panel With LDL/HDL Ratio     Endocrine   Type 2 diabetes mellitus with hyperglycemia ([facility]) - Primary    -INCREASE tresiba to 24 units from 16 -continue current sliding scale -may consider SGLT-1 or GLP-1 with next set of labs       Relevant Medications   insulin degludec (TRESIBA FLEXTOUCH) 100 UNIT/ML FlexTouch Pen   Other Relevant Orders   Lipid Panel With LDL/HDL Ratio   Hemoglobin A1c     Meds ordered this encounter  Medications   insulin degludec (TRESIBA FLEXTOUCH) 100 UNIT/ML FlexTouch Pen    Sig: Inject 24 Units into the skin at bedtime.    Dispense:  6 mL    Refill:  2      Noreene Larsson, NP

## 2020-11-03 ENCOUNTER — Ambulatory Visit (HOSPITAL_COMMUNITY): Payer: 59

## 2020-11-22 ENCOUNTER — Other Ambulatory Visit: Payer: Self-pay

## 2020-11-22 ENCOUNTER — Ambulatory Visit (INDEPENDENT_AMBULATORY_CARE_PROVIDER_SITE_OTHER): Payer: 59 | Admitting: Urology

## 2020-11-22 ENCOUNTER — Encounter: Payer: Self-pay | Admitting: Urology

## 2020-11-22 VITALS — BP 175/97 | HR 108

## 2020-11-22 DIAGNOSIS — N151 Renal and perinephric abscess: Secondary | ICD-10-CM

## 2020-11-22 DIAGNOSIS — R350 Frequency of micturition: Secondary | ICD-10-CM

## 2020-11-22 LAB — URINALYSIS, ROUTINE W REFLEX MICROSCOPIC
Bilirubin, UA: NEGATIVE
Glucose, UA: NEGATIVE
Leukocytes,UA: NEGATIVE
Nitrite, UA: NEGATIVE
Protein,UA: NEGATIVE
RBC, UA: NEGATIVE
Specific Gravity, UA: 1.01 (ref 1.005–1.030)
Urobilinogen, Ur: 0.2 mg/dL (ref 0.2–1.0)
pH, UA: 5.5 (ref 5.0–7.5)

## 2020-11-22 LAB — BLADDER SCAN AMB NON-IMAGING
Scan Result: 112
Scan Result: 263

## 2020-11-22 MED ORDER — MIRABEGRON ER 25 MG PO TB24: 25.0000 mg | ORAL_TABLET | Freq: Every day | ORAL | 0 refills | Status: DC

## 2020-11-22 NOTE — Progress Notes (Signed)
Assessment: 1. Renal abscess, left   2. Urinary frequency     Plan: I personally reviewed the CT study from 10/17/2020 showing a decrease in size of the left renal abscess.  Results discussed with the patient. Trial of Myrbetriq 25 mg daily.  Samples provided.  Use and side effects discussed. Discussed need for repeat imaging of the left renal abscess in approximately 1 month. Will arrange for CT imaging prior to next visit. Return to office in 1 month   Chief Complaint: Chief Complaint  Patient presents with   renal mass     HPI: Samantha Blackburn is a 40 y.o. female who presents for continued evaluation of left renal mass.  She reported onset of nausea, diarrhea, and left flank and back pain.  She was also having some nausea and vomiting.  No fever.  She was seen at urgent care and diagnosed with a UTI on 09/22/2020.  Urine culture showed mixed flora.  She was treated with Keflex.  She had worsening of her symptoms with continued nausea, vomiting, left-sided back pain, and chills.  She presented to the emergency room on 09/26/2020.  CT imaging showed a 1.8 cm rounded low-density lesion in the midpole cortex of the left kidney which was new in comparison to a study from 07/14/2020.  She was started on IV antibiotics.  Urine and blood cultures showed no growth.  A MRI from 09/27/2020 showed a rounded lesion with a thin rim of subtle hyperintensity measuring 1.5 cm and felt to be suggestive of a small renal abscess.  She continued on IV Rocephin and was discharged on 09/29/2020.  Since discharge she noted gradual improvement in her symptoms.  No fever or chills.  No dysuria or gross hematuria.  She continued to have intermittent discomfort in the left flank and back area.  She has completed the course of IV Rocephin. No history of recurrent UTIs.  No history of kidney stones.  Follow-up CT from 10/17/2020 showed low-attenuation lesion in the cortex of the left lower pole with surrounding  hypoenhancement measuring 1.4 x 0.8 cm, no evidence of obstruction, and a small nonobstructing stone in the lower pole of the left kidney.   She is not having any fevers or chills.  She does report some bilateral low back pain.  No left-sided flank pain.  No dysuria or gross hematuria.  She does have frequency and urgency.  No urinary incontinence.  Portions of the above documentation were copied from a prior visit for review purposes only.  Allergies: Allergies  Allergen Reactions   Morphine     PMH: Past Medical History:  Diagnosis Date   Diabetes mellitus without complication ([place])    Diabetes mellitus, new onset ([place]) 11/29/2012   Obesity 12/01/2012   Trichimoniasis     PSH: Past Surgical History:  Procedure Laterality Date   CHOLECYSTECTOMY      SH: Social History   Tobacco Use   Smoking status: Former    Packs/day: 1.00    Years: 10.00    Pack years: 10.00    Types: Cigarettes    Quit date: 05/10/2010    Years since quitting: 10.5   Smokeless tobacco: Never  Vaping Use   Vaping Use: Never used  Substance Use Topics   Alcohol use: No   Drug use: No    ROS: Constitutional:  Negative for fever, chills, weight loss CV: Negative for chest pain, previous MI, hypertension Respiratory:  Negative for shortness of breath, wheezing, sleep apnea,  frequent cough GI:  Negative for nausea, vomiting, bloody stool, GERD  PE: BP (!) 175/97   Pulse (!) 108  GENERAL APPEARANCE:  Well appearing, well developed, well nourished, NAD HEENT:  Atraumatic, normocephalic, oropharynx clear NECK:  Supple without lymphadenopathy or thyromegaly ABDOMEN:  Soft, non-tender, no masses EXTREMITIES:  Moves all extremities well, without clubbing, cyanosis, or edema NEUROLOGIC:  Alert and oriented x 3, normal gait, CN II-XII grossly intact MENTAL STATUS:  appropriate BACK:  Non-tender to palpation, No CVAT SKIN:  Warm, dry, and intact   Results: U/A dipstick:  3+ glucose  PVR:  112  ml

## 2020-11-22 NOTE — Progress Notes (Signed)
Urological Symptom Review PVR 263 after voiding x1  PVR after voiding x2 12 Patient is experiencing the following symptoms: none   Review of Systems  Gastrointestinal (upper)  : Negative for upper GI symptoms  Gastrointestinal (lower) : Negative for lower GI symptoms  Constitutional : Negative for symptoms  Skin: Negative for skin symptoms  Eyes: Negative for eye symptoms  Ear/Nose/Throat : Negative for Ear/Nose/Throat symptoms  Hematologic/Lymphatic: Negative for Hematologic/Lymphatic symptoms  Cardiovascular : Negative for cardiovascular symptoms  Respiratory : Negative for respiratory symptoms  Endocrine: Negative for endocrine symptoms  Musculoskeletal: Back pain  Neurological: Negative for neurological symptoms  Psychologic: Negative for psychiatric symptoms

## 2020-11-24 ENCOUNTER — Other Ambulatory Visit: Payer: Self-pay | Admitting: Oncology

## 2020-11-24 DIAGNOSIS — C50919 Malignant neoplasm of unspecified site of unspecified female breast: Secondary | ICD-10-CM

## 2020-11-25 ENCOUNTER — Telehealth: Payer: Self-pay | Admitting: *Deleted

## 2020-11-25 NOTE — Telephone Encounter (Signed)
Pt called about an order put in for a whole body bone scan. It was entered by Doreene Burke from [facility] and today it was cancelled but it was not Dr. Janese Banks that ordered but her names was put in as the  doctor that ordered. 11/11 the scan was cancelled. I did let the client know that it was not cancer center that put the order in. And it has been cancelled now. She is glad it was taken care of

## 2020-11-28 ENCOUNTER — Ambulatory Visit: Payer: 59

## 2020-12-06 ENCOUNTER — Ambulatory Visit (INDEPENDENT_AMBULATORY_CARE_PROVIDER_SITE_OTHER): Payer: 59 | Admitting: Nurse Practitioner

## 2020-12-06 ENCOUNTER — Encounter: Payer: Self-pay | Admitting: Nurse Practitioner

## 2020-12-06 ENCOUNTER — Other Ambulatory Visit: Payer: Self-pay

## 2020-12-06 ENCOUNTER — Ambulatory Visit: Payer: 59

## 2020-12-06 DIAGNOSIS — R051 Acute cough: Secondary | ICD-10-CM | POA: Diagnosis not present

## 2020-12-06 DIAGNOSIS — R0981 Nasal congestion: Secondary | ICD-10-CM | POA: Diagnosis not present

## 2020-12-06 DIAGNOSIS — J029 Acute pharyngitis, unspecified: Secondary | ICD-10-CM | POA: Diagnosis not present

## 2020-12-06 DIAGNOSIS — J00 Acute nasopharyngitis [common cold]: Secondary | ICD-10-CM | POA: Diagnosis not present

## 2020-12-06 LAB — POCT INFLUENZA A/B
Influenza A, POC: NEGATIVE
Influenza B, POC: NEGATIVE

## 2020-12-06 LAB — POCT RAPID STREP A (OFFICE): Rapid Strep A Screen: NEGATIVE

## 2020-12-06 MED ORDER — DEXTROMETHORPHAN HBR 15 MG/5ML PO SYRP: 10.0000 mL | ORAL_SOLUTION | Freq: Four times a day (QID) | ORAL | 0 refills | Status: DC | PRN

## 2020-12-06 MED ORDER — FLUTICASONE PROPIONATE 50 MCG/ACT NA SUSP: 2.0000 | Freq: Every day | NASAL | 0 refills | Status: DC

## 2020-12-06 NOTE — Assessment & Plan Note (Signed)
Flonase for nasal congestion. Dextromethorphan for nasal congestion Tylenol as needed for HA. Drink plenty of fluids.  Flu and strep negative.

## 2020-12-06 NOTE — Progress Notes (Addendum)
Virtual Visit via Telephone Note  I connected with  Samantha Blackburn  on 12/06/20 at 10:00 AM EST by telephone and verified that I am speaking with the correct person using two identifiers. I spent a total of 10 minutes talking to the pt and reviewing her chart.   Location: Patient:home  Provider:office   I discussed the limitations, risks, security and privacy concerns of performing an evaluation and management service by telephone and the availability of in person appointments. I also discussed with the patient that there may be a patient responsible charge related to this service. The patient expressed understanding and agreed to proceed.   History of Present Illness: Patient c/o chest congested, non productive cough, sore throat , nasal congestion that started one week ago. She sometimes have HA, no fever, no chills , no wheezing, no SOB CP ,has not been around anyone with flu covid. Mucinex does not help her symptoms. Her son had similar symptoms but tested negative for flu and COVID   Observations/Objective:   Assessment and Plan  Tested for flu and strep test. Both negative Symptoms probably due to common cold  Chest Congestion: Dextromethorphan Nasal congestion Flonase nasal spray  Use tylenol as needed for head ache. Drink plenty of fluid   Follow Up Instructions:    I discussed the assessment and treatment plan with the patient. The patient was provided an opportunity to ask questions and all were answered. The patient agreed with the plan and demonstrated an understanding of the instructions.   The patient was advised to call back or seek an in-person evaluation if the symptoms worsen or if the condition fails to improve as anticipated.

## 2020-12-12 NOTE — Addendum Note (Signed)
Addended by: Renee Rival on: 12/12/2020 10:01 PM   Modules accepted: Level of Service

## 2020-12-14 ENCOUNTER — Telehealth: Payer: Self-pay

## 2020-12-14 NOTE — Telephone Encounter (Signed)
[employer] and advised they needed to speak to someone regarding patient [name] [id number] Service Code [phone] Call Clinical and ask for the Physician support unit  [phone] Case #: [id number] Need notes for recent procedure from October

## 2020-12-15 ENCOUNTER — Ambulatory Visit (HOSPITAL_COMMUNITY): Payer: 59

## 2020-12-16 ENCOUNTER — Other Ambulatory Visit: Payer: Self-pay

## 2020-12-16 ENCOUNTER — Other Ambulatory Visit: Payer: 59

## 2020-12-16 DIAGNOSIS — E1159 Type 2 diabetes mellitus with other circulatory complications: Secondary | ICD-10-CM

## 2020-12-16 DIAGNOSIS — I152 Hypertension secondary to endocrine disorders: Secondary | ICD-10-CM

## 2020-12-16 DIAGNOSIS — N151 Renal and perinephric abscess: Secondary | ICD-10-CM

## 2020-12-17 LAB — BUN+CREAT
BUN/Creatinine Ratio: 7 — ABNORMAL LOW (ref 9–23)
BUN: 4 mg/dL — ABNORMAL LOW (ref 6–20)
Creatinine, Ser: 0.58 mg/dL (ref 0.57–1.00)
eGFR: 118 mL/min/{1.73_m2} (ref >59)

## 2020-12-19 ENCOUNTER — Ambulatory Visit (HOSPITAL_COMMUNITY)
Admission: RE | Admit: 2020-12-19 | Discharge: 2020-12-19 | Disposition: A | Discharge status: Home / Self Care | Payer: 59 | Source: Ambulatory Visit | Attending: Urology | Admitting: Urology

## 2020-12-19 ENCOUNTER — Other Ambulatory Visit: Payer: Self-pay

## 2020-12-19 DIAGNOSIS — N151 Renal and perinephric abscess: Secondary | ICD-10-CM | POA: Insufficient documentation

## 2020-12-19 MED ORDER — SODIUM CHLORIDE (PF) 0.9 % IJ SOLN
INTRAMUSCULAR | Status: AC
  Filled 2020-12-19: qty 50

## 2020-12-19 MED ORDER — IOHEXOL 350 MG/ML SOLN
80.0000 mL | Freq: Once | INTRAVENOUS | Status: AC | PRN
  Administered 2020-12-19: 80 mL via INTRAVENOUS

## 2020-12-19 NOTE — Telephone Encounter (Signed)
Pt has been r/s for ct scan on 12/19/20 at [facility]. Pt has been made aware.

## 2020-12-22 ENCOUNTER — Encounter: Payer: Self-pay | Admitting: Urology

## 2020-12-22 ENCOUNTER — Ambulatory Visit (INDEPENDENT_AMBULATORY_CARE_PROVIDER_SITE_OTHER): Payer: 59 | Admitting: Urology

## 2020-12-22 ENCOUNTER — Other Ambulatory Visit: Payer: Self-pay

## 2020-12-22 VITALS — BP 153/100 | Temp 106.0°F

## 2020-12-22 DIAGNOSIS — R350 Frequency of micturition: Secondary | ICD-10-CM

## 2020-12-22 DIAGNOSIS — N151 Renal and perinephric abscess: Secondary | ICD-10-CM

## 2020-12-22 LAB — URINALYSIS, ROUTINE W REFLEX MICROSCOPIC
Bilirubin, UA: NEGATIVE
Ketones, UA: NEGATIVE
Leukocytes,UA: NEGATIVE
Nitrite, UA: NEGATIVE
Protein,UA: NEGATIVE
Specific Gravity, UA: <1.005 — ABNORMAL LOW (ref 1.005–1.030)
Urobilinogen, Ur: 0.2 mg/dL (ref 0.2–1.0)
pH, UA: 5.5 (ref 5.0–7.5)

## 2020-12-22 LAB — MICROSCOPIC EXAMINATION: Renal Epithel, UA: NONE SEEN /hpf

## 2020-12-22 MED ORDER — MIRABEGRON ER 25 MG PO TB24: 25.0000 mg | ORAL_TABLET | Freq: Every day | ORAL | 11 refills | Status: DC

## 2020-12-22 NOTE — Progress Notes (Signed)

## 2020-12-22 NOTE — Progress Notes (Signed)
Assessment: 1. Renal abscess, left   2. Urinary frequency     Plan: I personally reviewed the CT study from 12/19/2020 which shows a continued decrease in the size of the left renal abscess.  She is not currently symptomatic. Recommend continued follow-up with repeat imaging in 4-6 months. Continue Myrbetriq 25 mg daily.  Samples and prescription provided. Return to office in 4 months.  Chief Complaint: Chief Complaint  Patient presents with   renal abscess    HPI: Samantha Blackburn is a 40 y.o. female who presents for continued evaluation of left renal mass.  She reported onset of nausea, diarrhea, and left flank and back pain.  She was also having some nausea and vomiting.  No fever.  She was seen at urgent care and diagnosed with a UTI on 09/22/2020.  Urine culture showed mixed flora.  She was treated with Keflex.  She had worsening of her symptoms with continued nausea, vomiting, left-sided back pain, and chills.  She presented to the emergency room on 09/26/2020.  CT imaging showed a 1.8 cm rounded low-density lesion in the midpole cortex of the left kidney which was new in comparison to a study from 07/14/2020.  She was started on IV antibiotics.  Urine and blood cultures showed no growth.  A MRI from 09/27/2020 showed a rounded lesion with a thin rim of subtle hyperintensity measuring 1.5 cm and felt to be suggestive of a small renal abscess.  She continued on IV Rocephin and was discharged on 09/29/2020.  Since discharge she noted gradual improvement in her symptoms.  No fever or chills.  No dysuria or gross hematuria.  She continued to have intermittent discomfort in the left flank and back area.  She has completed the course of IV Rocephin. No history of recurrent UTIs.  No history of kidney stones.  CT from 10/17/2020 showed low-attenuation lesion in the cortex of the left lower pole with surrounding hypoenhancement measuring 1.4 x 0.8 cm, no evidence of obstruction, and a small  nonobstructing stone in the lower pole of the left kidney.  At the time of her visit on 11/22/20, she was not having any fevers or chills.  She reported some bilateral low back pain.  No left-sided flank pain.  No dysuria or gross hematuria.  She was having frequency and urgency.  No urinary incontinence. She was given samples of Myrbetriq 25 mg daily for her urinary symptoms. CT imaging from 12/19/2020 showed continued decrease in size of the low-attenuation cortical lesion in the left lower pole now measuring 9 x 4 mm in size.  She returns today for follow-up.  She is not having any flank pain.  No dysuria or gross hematuria.  No fevers or chills.  She has noted some improvement in her frequency and urgency with Myrbetriq 25 mg daily.  No side effects.   Portions of the above documentation were copied from a prior visit for review purposes only.  Allergies: Allergies  Allergen Reactions   Morphine     PMH: Past Medical History:  Diagnosis Date   Diabetes mellitus without complication ([place])    Diabetes mellitus, new onset ([place]) 11/29/2012   Obesity 12/01/2012   Trichimoniasis     PSH: Past Surgical History:  Procedure Laterality Date   CHOLECYSTECTOMY      SH: Social History   Tobacco Use   Smoking status: Former    Packs/day: 1.00    Years: 10.00    Pack years: 10.00  Types: Cigarettes    Quit date: 05/10/2010    Years since quitting: 10.6   Smokeless tobacco: Never  Vaping Use   Vaping Use: Never used  Substance Use Topics   Alcohol use: No   Drug use: No    ROS: Constitutional:  Negative for fever, chills, weight loss CV: Negative for chest pain, previous MI, hypertension Respiratory:  Negative for shortness of breath, wheezing, sleep apnea, frequent cough GI:  Negative for nausea, vomiting, bloody stool, GERD  PE: BP (!) 153/100   Temp (!) 106 F (41.1 C)  GENERAL APPEARANCE:  Well appearing, well developed, well nourished, NAD HEENT:  Atraumatic,  normocephalic, oropharynx clear NECK:  Supple without lymphadenopathy or thyromegaly ABDOMEN:  Soft, non-tender, no masses EXTREMITIES:  Moves all extremities well, without clubbing, cyanosis, or edema NEUROLOGIC:  Alert and oriented x 3, normal gait, CN II-XII grossly intact MENTAL STATUS:  appropriate BACK:  Non-tender to palpation, No CVAT SKIN:  Warm, dry, and intact   Results: U/A:0-5 WBC, 0-2 RBC

## 2020-12-26 ENCOUNTER — Ambulatory Visit (HOSPITAL_COMMUNITY): Payer: 59

## 2021-01-20 ENCOUNTER — Ambulatory Visit: Payer: 59 | Admitting: Nurse Practitioner

## 2021-02-03 ENCOUNTER — Encounter: Payer: 59 | Admitting: Nurse Practitioner

## 2021-02-06 ENCOUNTER — Ambulatory Visit: Payer: 59 | Admitting: Nurse Practitioner

## 2021-03-27 DIAGNOSIS — A599 Trichomoniasis, unspecified: Secondary | ICD-10-CM | POA: Insufficient documentation

## 2021-04-05 ENCOUNTER — Encounter: Payer: Self-pay | Admitting: Nurse Practitioner

## 2021-04-05 ENCOUNTER — Ambulatory Visit (INDEPENDENT_AMBULATORY_CARE_PROVIDER_SITE_OTHER): Payer: 59 | Admitting: Nurse Practitioner

## 2021-04-05 ENCOUNTER — Other Ambulatory Visit: Payer: Self-pay

## 2021-04-05 DIAGNOSIS — J329 Chronic sinusitis, unspecified: Secondary | ICD-10-CM | POA: Insufficient documentation

## 2021-04-05 DIAGNOSIS — E1165 Type 2 diabetes mellitus with hyperglycemia: Secondary | ICD-10-CM

## 2021-04-05 DIAGNOSIS — Z794 Long term (current) use of insulin: Secondary | ICD-10-CM | POA: Diagnosis not present

## 2021-04-05 DIAGNOSIS — J310 Chronic rhinitis: Secondary | ICD-10-CM | POA: Diagnosis not present

## 2021-04-05 MED ORDER — CETIRIZINE HCL 10 MG PO TABS: 10.0000 mg | ORAL_TABLET | Freq: Every day | ORAL | 11 refills | Status: DC

## 2021-04-05 MED ORDER — SALINE SPRAY 0.65 % NA SOLN
1.0000 | NASAL | 0 refills | Status: DC | PRN
Start: 2021-04-05 — End: 2023-03-22

## 2021-04-05 NOTE — Assessment & Plan Note (Addendum)
Patient is over  due for follow-up  ?Has upcoming appointment, patient educated on the need to keep upcoming appointment she verbalized understanding.  Labs ordered today, takes [place] 24 units at bedtime, on sliding scale NovoLog. ? ?

## 2021-04-05 NOTE — Progress Notes (Signed)
Virtual Visit via Telephone Note ? ?I connected with Samantha Blackburn @ on 04/05/21 at 1224pmby telephone and verified that I am speaking with the correct person using two identifiers.  I spent 10 minutes on this telephone encounter.  ? ?Location: ?Patient: home ?Provider: office ?  ?I discussed the limitations, risks, security and privacy concerns of performing an evaluation and management service by telephone and the availability of in person appointments. I also discussed with the patient that there may be a patient responsible charge related to this service. The patient expressed understanding and agreed to proceed. ? ? ?History of Present Illness: ?Pt with past medical history of type 2 diabetes with hyperglycemia, obesity, hypertension associated with diabetes c/o /stuffy nose, congestion, HA, sneezing, clear colored nasal drainage, since a week agio, she has taken mucinex, dayquil nothing helped.  She has Flonase at home but has not been using it  ?denies fever, chills, wheezing sob , cp , bloody sputum , no known exposure to sick contacts, patient states that she does not smoke. ?  ?Observations/Objective: ? ? ?Assessment and Plan: ?rhinosinusitis ?Flonase nasal spray take 2 spray into the nostrils daily ?Saline nasal spray as needed. ?Cetirizine 10 mg daily. ?Take Tylenol 650 mg as needed for headaches.  ? ?Type 2 Diabetes mellitus with hyperglycemia ?Patient is over  due for follow-up  ?Has upcoming appointment, patient educated on the need to keep upcoming appointment she verbalized understanding.  Labs ordered today, takes [place] 24 units at bedtime, on sliding scale NovoLog.  Patient told to get fasting labs done 3 to 5 days before her upcoming appointment she verbalized understanding ? ? ? ?Follow Up Instructions: ? ?  ?I discussed the assessment and treatment plan with the patient. The patient was provided an opportunity to ask questions and all were answered. The patient agreed with the plan and  demonstrated an understanding of the instructions. ?  ?The patient was advised to call back or seek an in-person evaluation if the symptoms worsen or if the condition fails to improve as anticipated.  ?

## 2021-04-05 NOTE — Patient Instructions (Signed)
Please get fasting labs done 3 to 5 days before your follow-up appointment. ? ?Take Zyrtec 10 mg daily for your rhinosinusitis ?Take Flonase nasal spray 2 spray into each nostril daily, use saline nasal spray as needed. ? ? ?It is important that you exercise regularly at least 30 minutes 5 times a week.  ?Think about what you will eat, plan ahead. ?Choose " clean, green, fresh or frozen" over canned, processed or packaged foods which are more sugary, salty and fatty. ?70 to 75% of food eaten should be vegetables and fruit. ?Three meals at set times with snacks allowed between meals, but they must be fruit or vegetables. ?Aim to eat over a 12 hour period , example 7 am to 7 pm, and STOP after  your last meal of the day. ?Drink water,generally about 64 ounces per day, no other drink is as healthy. Fruit juice is best enjoyed in a healthy way, by EATING the fruit. ? ?Thanks for choosing [facility], we consider it a privelige to serve you. ? ?

## 2021-04-05 NOTE — Assessment & Plan Note (Signed)
Rhinosinusitis ?Flonase nasal spray take 2 spray into the nostrils daily ?Saline nasal spray as needed. ?Cetirizine 10 mg daily. ?Take Tylenol 650 mg as needed for headaches.  ?

## 2021-04-17 ENCOUNTER — Other Ambulatory Visit: Payer: Self-pay

## 2021-04-17 DIAGNOSIS — N151 Renal and perinephric abscess: Secondary | ICD-10-CM

## 2021-04-17 DIAGNOSIS — N2889 Other specified disorders of kidney and ureter: Secondary | ICD-10-CM

## 2021-04-17 NOTE — Addendum Note (Signed)
Addended byIris Pert on: 04/17/2021 12:56 PM ? ? Modules accepted: Orders ? ?

## 2021-04-18 ENCOUNTER — Encounter (HOSPITAL_COMMUNITY): Payer: Self-pay | Admitting: Radiology

## 2021-04-18 ENCOUNTER — Ambulatory Visit (HOSPITAL_COMMUNITY)
Admission: RE | Admit: 2021-04-18 | Discharge: 2021-04-18 | Disposition: A | Discharge status: Home / Self Care | Payer: 59 | Source: Ambulatory Visit | Attending: Urology | Admitting: Urology

## 2021-04-18 DIAGNOSIS — N2889 Other specified disorders of kidney and ureter: Secondary | ICD-10-CM | POA: Insufficient documentation

## 2021-04-18 LAB — POCT I-STAT CREATININE: Creatinine, Ser: 0.4 mg/dL — ABNORMAL LOW (ref 0.44–1.00)

## 2021-04-18 MED ORDER — IOHEXOL 300 MG/ML  SOLN
100.0000 mL | Freq: Once | INTRAMUSCULAR | Status: AC | PRN
  Administered 2021-04-18: 80 mL via INTRAVENOUS

## 2021-04-20 ENCOUNTER — Encounter: Payer: Self-pay | Admitting: Urology

## 2021-04-20 ENCOUNTER — Ambulatory Visit (INDEPENDENT_AMBULATORY_CARE_PROVIDER_SITE_OTHER): Payer: 59 | Admitting: Urology

## 2021-04-20 VITALS — BP 147/90 | HR 108 | Wt 142.2 lb

## 2021-04-20 DIAGNOSIS — R350 Frequency of micturition: Secondary | ICD-10-CM | POA: Diagnosis not present

## 2021-04-20 DIAGNOSIS — R339 Retention of urine, unspecified: Secondary | ICD-10-CM

## 2021-04-20 DIAGNOSIS — N151 Renal and perinephric abscess: Secondary | ICD-10-CM

## 2021-04-20 LAB — URINALYSIS, ROUTINE W REFLEX MICROSCOPIC
Bilirubin, UA: NEGATIVE
Ketones, UA: NEGATIVE
Leukocytes,UA: NEGATIVE
Nitrite, UA: NEGATIVE
Protein,UA: NEGATIVE
RBC, UA: NEGATIVE
Specific Gravity, UA: <1.005 — ABNORMAL LOW (ref 1.005–1.030)
Urobilinogen, Ur: 0.2 mg/dL (ref 0.2–1.0)
pH, UA: 6 (ref 5.0–7.5)

## 2021-04-20 LAB — BLADDER SCAN AMB NON-IMAGING: Scan Result: 216

## 2021-04-20 NOTE — Progress Notes (Signed)
? ?Assessment: ?1. Renal abscess, left   ?2. Urinary frequency   ?3. Incomplete bladder emptying   ? ? ?Plan: ?I personally reviewed the CT study from 04/19/2021 showing resolution of the left renal abscess.  Results discussed with the patient. ?Given her incomplete bladder emptying, I recommended that she stop the Myrbetriq. ?Return to office in 3-4 weeks for reevaluation with bladder scan. ? ?Chief Complaint: ?Chief Complaint  ?Patient presents with  ? renal abscess, left  ? ? ?HPI: ?Samantha Blackburn is a 41 y.o. female who presents for continued evaluation of left renal mass.  She reported onset of nausea, diarrhea, and left flank and back pain.  She was also having some nausea and vomiting.  No fever.  She was seen at urgent care and diagnosed with a UTI on 09/22/2020.  Urine culture showed mixed flora.  She was treated with Keflex.  She had worsening of her symptoms with continued nausea, vomiting, left-sided back pain, and chills.  She presented to the emergency room on 09/26/2020.  CT imaging showed a 1.8 cm rounded low-density lesion in the midpole cortex of the left kidney which was new in comparison to a study from 07/14/2020.  She was started on IV antibiotics.  Urine and blood cultures showed no growth.  A MRI from 09/27/2020 showed a rounded lesion with a thin rim of subtle hyperintensity measuring 1.5 cm and felt to be suggestive of a small renal abscess.  She continued on IV Rocephin and was discharged on 09/29/2020.  Since discharge she noted gradual improvement in her symptoms.  No fever or chills.  No dysuria or gross hematuria.  She continued to have intermittent discomfort in the left flank and back area.  She has completed the course of IV Rocephin. ?No history of recurrent UTIs.  No history of kidney stones. ? ?CT from 10/17/2020 showed low-attenuation lesion in the cortex of the left lower pole with surrounding hypoenhancement measuring 1.4 x 0.8 cm, no evidence of obstruction, and a small  nonobstructing stone in the lower pole of the left kidney.  ?At the time of her visit on 11/22/20, she was not having any fevers or chills.  She reported some bilateral low back pain.  No left-sided flank pain.  No dysuria or gross hematuria.  She was having frequency and urgency.  No urinary incontinence. ?She was given samples of Myrbetriq 25 mg daily for her urinary symptoms. ?CT imaging from 12/19/2020 showed continued decrease in size of the low-attenuation cortical lesion in the left lower pole now measuring 9 x 4 mm in size. ? ?CT imaging from 04/19/2021 showed resolution of the previously seen low attenuating cortical lesion in the left lower pole, no solid enhancing renal masses, no evidence of obstruction. ? ?She returns today for follow-up.  She has not had any flank pain, dysuria, or gross hematuria.  She has continued on Myrbetriq 25 mg daily.  She continues to have some symptoms of frequency, urgency, and sensation of incomplete emptying. ? ? ?Portions of the above documentation were copied from a prior visit for review purposes only. ? ?Allergies: ?Allergies  ?Allergen Reactions  ? Morphine   ? ? ?PMH: ?Past Medical History:  ?Diagnosis Date  ? Diabetes mellitus without complication ([place])   ? Diabetes mellitus, new onset ([place]) 11/29/2012  ? Obesity 12/01/2012  ? Trichimoniasis   ? ? ?PSH: ?Past Surgical History:  ?Procedure Laterality Date  ? CHOLECYSTECTOMY    ? ? ?SH: ?Social History  ? ?Tobacco  Use  ? Smoking status: Former  ?  Packs/day: 1.00  ?  Years: 10.00  ?  Pack years: 10.00  ?  Types: Cigarettes  ?  Quit date: 05/10/2010  ?  Years since quitting: 10.9  ? Smokeless tobacco: Never  ?Vaping Use  ? Vaping Use: Never used  ?Substance Use Topics  ? Alcohol use: No  ? Drug use: No  ? ? ?ROS: ?Constitutional:  Negative for fever, chills, weight loss ?CV: Negative for chest pain, previous MI, hypertension ?Respiratory:  Negative for shortness of breath, wheezing, sleep apnea, frequent cough ?GI:   Negative for nausea, vomiting, bloody stool, GERD ? ?PE: ?BP (!) 147/90   Pulse (!) 108   Wt 142 lb 3.2 oz (64.5 kg)   BMI 25.19 kg/m?  ?GENERAL APPEARANCE:  Well appearing, well developed, well nourished, NAD ?HEENT:  Atraumatic, normocephalic, oropharynx clear ?NECK:  Supple without lymphadenopathy or thyromegaly ?ABDOMEN:  Soft, non-tender, no masses ?EXTREMITIES:  Moves all extremities well, without clubbing, cyanosis, or edema ?NEUROLOGIC:  Alert and oriented x 3, normal gait, CN II-XII grossly intact ?MENTAL STATUS:  appropriate ?BACK:  Non-tender to palpation, No CVAT ?SKIN:  Warm, dry, and intact ? ? ?Results: ?U/A:  dipstick negative ? ?PVR:  216 ml ? ?CT ABDOMEN WITHOUT AND WITH CONTRAST ?  ?TECHNIQUE: ?Multidetector CT imaging of the abdomen was performed following the ?standard protocol before and following the bolus administration of ?intravenous contrast. ?  ?RADIATION DOSE REDUCTION: This exam was performed according to the ?departmental dose-optimization program which includes automated ?exposure control, adjustment of the mA and/or kV according to ?patient size and/or use of iterative reconstruction technique. ?  ?CONTRAST:  77m OMNIPAQUE IOHEXOL 300 MG/ML  SOLN ?  ?COMPARISON:  Multiple priors including most recent CT December 5, ?2022 ?  ?FINDINGS: ?Lower chest: No acute abnormality. ?  ?Hepatobiliary: No suspicious hepatic lesion. Gallbladder surgically ?absent. No biliary ductal dilation. ?  ?Pancreas: No pancreatic ductal dilation or evidence of acute ?inflammation. ?  ?Spleen: Wedge-shaped hypodense area along the posterior aspect of ?the spleen on image 26/6 is not evident on precontrast imaging ?enlarged resolves on delayed imaging, stable from prior and ?consistent with a benign but indeterminate finding. ?  ?Adrenals/Urinary Tract: Bilateral adrenal glands appear normal. ?  ?No hydronephrosis.  No nephrolithiasis. ?  ?Resolution of the previously seen low attenuating lesion  cortical ?lesion in the left lower pole which is now too small to accurately ?characterize now only demonstrating cortical scarring in this ?region. No solid enhancing renal masses. ?  ?Kidneys demonstrate symmetric enhancement and excretion of contrast ?material. ?  ?Stomach/Bowel: Radiopaque enteric contrast material traverses distal ?loops of small bowel. Stomach is distended with ingested contents ?and gas without abnormal wall thickening identified. No pathologic ?dilation or evidence of acute inflammation involving of loops of ?large or small bowel in the abdomen. No evidence of acute bowel ?inflammation ?  ?Vascular/Lymphatic: No significant vascular findings are present. No ?enlarged abdominal or pelvic lymph nodes. ?  ?Other: No significant abdominopelvic free fluid. ?  ?Musculoskeletal: No acute osseous abnormality. ?  ?IMPRESSION: ?Resolution of the previously seen low attenuating cortical lesion in ?the left lower pole now demonstrating cortical scarring in this ?region, reflecting sequela of prior infection. No solid enhancing ?renal masses. ?  ?  ?Electronically Signed ?  By: JDahlia BailiffM.D. ?  On: 04/19/2021 12:01 ?

## 2021-04-20 NOTE — Progress Notes (Signed)
post void residual=216 

## 2021-04-21 ENCOUNTER — Ambulatory Visit: Payer: 59 | Admitting: Nurse Practitioner

## 2021-05-09 ENCOUNTER — Other Ambulatory Visit: Payer: Self-pay | Admitting: Nurse Practitioner

## 2021-05-09 DIAGNOSIS — E1165 Type 2 diabetes mellitus with hyperglycemia: Secondary | ICD-10-CM

## 2021-05-09 LAB — LIPID PANEL
Chol/HDL Ratio: 3.3 ratio (ref 0.0–4.4)
Cholesterol, Total: 183 mg/dL (ref 100–199)
HDL: 56 mg/dL (ref >39)
LDL Chol Calc (NIH): 107 mg/dL — ABNORMAL HIGH (ref 0–99)
Triglycerides: 109 mg/dL (ref 0–149)
VLDL Cholesterol Cal: 20 mg/dL (ref 5–40)

## 2021-05-09 LAB — CMP14+EGFR
ALT: 16 IU/L (ref 0–32)
AST: 16 IU/L (ref 0–40)
Albumin/Globulin Ratio: 1.6 (ref 1.2–2.2)
Albumin: 4 g/dL (ref 3.8–4.8)
Alkaline Phosphatase: 80 IU/L (ref 44–121)
BUN/Creatinine Ratio: 13 (ref 9–23)
BUN: 8 mg/dL (ref 6–20)
Bilirubin Total: 0.6 mg/dL (ref 0.0–1.2)
CO2: 21 mmol/L (ref 20–29)
Calcium: 9.4 mg/dL (ref 8.7–10.2)
Chloride: 98 mmol/L (ref 96–106)
Creatinine, Ser: 0.64 mg/dL (ref 0.57–1.00)
Globulin, Total: 2.5 g/dL (ref 1.5–4.5)
Glucose: 460 mg/dL — ABNORMAL HIGH (ref 70–99)
Potassium: 4.1 mmol/L (ref 3.5–5.2)
Sodium: 133 mmol/L — ABNORMAL LOW (ref 134–144)
Total Protein: 6.5 g/dL (ref 6.0–8.5)
eGFR: 115 mL/min/{1.73_m2} (ref >59)

## 2021-05-09 LAB — CBC WITH DIFFERENTIAL/PLATELET
Basophils Absolute: 0 10*3/uL (ref 0.0–0.2)
Basos: 0 %
EOS (ABSOLUTE): 0.1 10*3/uL (ref 0.0–0.4)
Eos: 2 %
Hematocrit: 43.2 % (ref 34.0–46.6)
Hemoglobin: 14.8 g/dL (ref 11.1–15.9)
Immature Grans (Abs): 0 10*3/uL (ref 0.0–0.1)
Immature Granulocytes: 0 %
Lymphocytes Absolute: 2 10*3/uL (ref 0.7–3.1)
Lymphs: 24 %
MCH: 31.5 pg (ref 26.6–33.0)
MCHC: 34.3 g/dL (ref 31.5–35.7)
MCV: 92 fL (ref 79–97)
Monocytes Absolute: 0.4 10*3/uL (ref 0.1–0.9)
Monocytes: 5 %
Neutrophils Absolute: 5.8 10*3/uL (ref 1.4–7.0)
Neutrophils: 69 %
Platelets: 248 10*3/uL (ref 150–450)
RBC: 4.7 x10E6/uL (ref 3.77–5.28)
RDW: 12.4 % (ref 11.7–15.4)
WBC: 8.4 10*3/uL (ref 3.4–10.8)

## 2021-05-09 LAB — HEMOGLOBIN A1C
Est. average glucose Bld gHb Est-mCnc: 321 mg/dL
Hgb A1c MFr Bld: 12.8 % — ABNORMAL HIGH (ref 4.8–5.6)

## 2021-05-10 ENCOUNTER — Ambulatory Visit (INDEPENDENT_AMBULATORY_CARE_PROVIDER_SITE_OTHER): Payer: 59 | Admitting: Nurse Practitioner

## 2021-05-10 ENCOUNTER — Encounter: Payer: Self-pay | Admitting: Nurse Practitioner

## 2021-05-10 ENCOUNTER — Telehealth: Payer: Self-pay

## 2021-05-10 VITALS — BP 138/62 | HR 101 | Ht 63.0 in | Wt 145.0 lb

## 2021-05-10 DIAGNOSIS — E785 Hyperlipidemia, unspecified: Secondary | ICD-10-CM | POA: Diagnosis not present

## 2021-05-10 DIAGNOSIS — Z794 Long term (current) use of insulin: Secondary | ICD-10-CM

## 2021-05-10 DIAGNOSIS — E1165 Type 2 diabetes mellitus with hyperglycemia: Secondary | ICD-10-CM | POA: Diagnosis not present

## 2021-05-10 DIAGNOSIS — E871 Hypo-osmolality and hyponatremia: Secondary | ICD-10-CM

## 2021-05-10 DIAGNOSIS — N151 Renal and perinephric abscess: Secondary | ICD-10-CM | POA: Diagnosis not present

## 2021-05-10 DIAGNOSIS — G629 Polyneuropathy, unspecified: Secondary | ICD-10-CM

## 2021-05-10 DIAGNOSIS — I152 Hypertension secondary to endocrine disorders: Secondary | ICD-10-CM

## 2021-05-10 DIAGNOSIS — Z23 Encounter for immunization: Secondary | ICD-10-CM | POA: Diagnosis not present

## 2021-05-10 DIAGNOSIS — E1159 Type 2 diabetes mellitus with other circulatory complications: Secondary | ICD-10-CM

## 2021-05-10 MED ORDER — ATORVASTATIN CALCIUM 40 MG PO TABS: 40.0000 mg | ORAL_TABLET | Freq: Every day | ORAL | 3 refills | Status: DC

## 2021-05-10 MED ORDER — GABAPENTIN 100 MG PO CAPS: 100.0000 mg | ORAL_CAPSULE | Freq: Three times a day (TID) | ORAL | 3 refills | Status: DC

## 2021-05-10 MED ORDER — INSULIN ASPART 100 UNIT/ML FLEXPEN: PEN_INJECTOR | SUBCUTANEOUS | 1 refills | Status: DC

## 2021-05-10 MED ORDER — TRESIBA FLEXTOUCH 100 UNIT/ML ~~LOC~~ SOPN: 24.0000 [IU] | PEN_INJECTOR | Freq: Every day | SUBCUTANEOUS | 2 refills | Status: DC

## 2021-05-10 NOTE — Progress Notes (Addendum)
? ?Samantha Blackburn     MRN: [id number]      DOB: 1980-01-22 ? ? ?HPI ?Ms. Hoh with past medical history of hypertension associated with diabetes, type 2 diabetes with hyperglycemia, hyperlipidemia is here for follow up and re-evaluation of chronic medical conditions, medication management and review of any available recent lab . ? ?The PT denies any adverse reactions to current medications since the last visit.  ? ? ?She was seen in the ER on last week for c/o right thigh burning sensation that started 3 weeks ago , pain wakes her up at nights, feels like fire burning in her thigh, denies trauma, has type 2 diabetes. 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 In the ER she was diagnosed with meralgia paresthetica of right side. She has been taking ibuprofen but it snot helping.   ? ?Type 2 diabetes with hyperglycemia AM CBG 110-120, bedtime 140-170.  Currently on and Sliding scale NovoLog she drinks juice, she has cut down soda a lot but drinks one soda at work daily, drinks strawberry lemodae at work.  Patient denies hypoglycemic episodes.  ? ? ?ROS ?Denies recent fever or chills. ?Denies sinus pressure, nasal congestion, ear pain or sore throat. ?Denies chest congestion, productive cough or wheezing. ?Denies chest pains, palpitations and leg swelling ?Denies abdominal pain, nausea, vomiting,diarrhea or constipation.   ?Denies dysuria, frequency, hesitancy or incontinence. ?Denies joint swelling and limitation in mobility. ?Denies headaches, seizures, numbness, or tingling. ?Denies depression, anxiety or insomnia. ? ? ?PE ? ?BP 138/62 (BP Location: Right Arm, Cuff Size: Large)   Pulse (!) 101   Ht '5\' 3"'  (1.6 m)   Wt 145 lb (65.8 kg)   SpO2 100%   BMI 25.69 kg/m?  ? ?Patient alert and oriented and in no cardiopulmonary distress. ? ? ? ?Chest: Clear to auscultation bilaterally. ? ?CVS: S1, S2 no murmurs, no S3.Regular rate. ? ?ABD: Soft non tender.  ? ?Ext: No edema ? ?MS: Adequate ROM spine, shoulders, hips and  knees, tenderness on palpation of right thigh ? ?Skin: Intact, no ulcerations or rash noted. ? ?Psych: Good eye contact, normal affect. Memory intact not anxious or depressed appearing. ? ?CNS: CN 2-12 intact, power,  normal throughout.no focal deficits noted. ? ? ?Assessment & Plan ? ?Hypertension associated with diabetes ([place]) ?BP Readings from Last 3 Encounters:  ?05/10/21 138/62  ?04/20/21 (!) 147/90  ?12/22/20 (!) 153/100  ?BP slightly elevated today,  ?Currently on Benicar 40 mg daily ?DASH diet advised engage in regular daily exercises at least 150 minutes weekly ?We will reevaluate BP at next visit and adjust medication if blood pressure is elevated.  ? ?Renal abscess ?States that she has been following up with urology, recently had a follw up CT scan that showed decrease in her renal abscess, has upcoming appointment in May urology.  Patient encouraged to maintain close follow-up with urology.  ? ?Hyperlipidemia LDL goal <70 ?Lab Results  ?Component Value Date  ? CHOL 183 05/08/2021  ? HDL 56 05/08/2021  ? LDLCALC 107 (H) 05/08/2021  ? TRIG 109 05/08/2021  ? CHOLHDL 3.3 05/08/2021  ? ?Currently on atorvastatin 69m daily ?Start atorvastatin 489mdaily. ?Avoid fried fatty foids ?Lipid panle in 8 weeks  ? ? ? ?Type 2 diabetes mellitus with hyperglycemia ([facility]) ?Lab Results  ?Component Value Date  ? HGBA1C 12.8 (H) 05/08/2021  ? ?Uncontrolled chronic condition ?On [place] 24 units at bedtime, sliding scale NovoLog. ?Patient encouraged to stay away from any food containing sugar soda  juice engage in daily exercises at least 30 minutes. ?Need to get blood sugar under control discussed with patient she verbalized understanding. ?Urgent referral placed to endocrinology. ?Diabetic eye exam ordered today ?Patient referred for diabetes education ?Urine microalbumin labs ordered today ?On ACE and statin ? 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  ? ?Hyponatremia ?Lab Results  ?Component Value Date  ? NA 133 (L) 05/08/2021  ? K 4.1 05/08/2021  ? CO2 21 05/08/2021  ? GLUCOSE 460 (H) 05/08/2021  ? BUN 8 05/08/2021  ? CREATININE 0.64 05/08/2021  ? CALCIUM 9.4 05/08/2021  ? EGFR 115 05/08/2021  ? GFRNONAA >60 08/21/2019  ? ?Due to hyperglycemia ?Pt referred to endo due to uncontrolled DM ?Denies seizures, fatigue ., lethargy , confusion. ?Need to get diabetes under control discussed with patient. ? ?Need for Tdap vaccination ?Patient educated on CDC recommendation for the vaccine. Verbal consent was obtained from the patient, vaccine administered by nurse, no sign of adverse reactions noted at this time. Patient education on arm soreness and use of tylenol  for this patient  was discussed. Patient educated on the signs and symptoms of adverse effect and advise to contact the office if they occur.  ?

## 2021-05-10 NOTE — Assessment & Plan Note (Addendum)
Lab Results  ?Component Value Date  ? NA 133 (L) 05/08/2021  ? K 4.1 05/08/2021  ? CO2 21 05/08/2021  ? GLUCOSE 460 (H) 05/08/2021  ? BUN 8 05/08/2021  ? CREATININE 0.64 05/08/2021  ? CALCIUM 9.4 05/08/2021  ? EGFR 115 05/08/2021  ? GFRNONAA >60 08/21/2019  ? ?Due to hyperglycemia ?Pt referred to endo due to uncontrolled DM ?Denies seizures, fatigue ., lethargy , confusion. ?Need to get diabetes under control discussed with patient. ?

## 2021-05-10 NOTE — Patient Instructions (Addendum)
Please schedule your diabetic eye exam today.Marland Kitchen  ?TDAP vaccine today  ?Pleas start taking atorvastatin '40mg'$  daily  ?Please follow up with endocrinology as dicussed.  ? ? ?It is important that you exercise regularly at least 30 minutes 5 times a week.  ?Think about what you will eat, plan ahead. ?Choose " clean, green, fresh or frozen" over canned, processed or packaged foods which are more sugary, salty and fatty. ?70 to 75% of food eaten should be vegetables and fruit. ?Three meals at set times with snacks allowed between meals, but they must be fruit or vegetables. ?Aim to eat over a 12 hour period , example 7 am to 7 pm, and STOP after  your last meal of the day. ?Drink water,generally about 64 ounces per day, no other drink is as healthy. Fruit juice is best enjoyed in a healthy way, by EATING the fruit. ? ?Thanks for choosing [facility], we consider it a privelige to serve you.  ? ? ? ?

## 2021-05-10 NOTE — Assessment & Plan Note (Addendum)
States that she has been following up with urology, recently had a follw up CT scan that showed decrease in her renal abscess, has upcoming appointment in May urology.  Patient encouraged to maintain close follow-up with urology.  ?

## 2021-05-10 NOTE — Assessment & Plan Note (Signed)
Patient educated on CDC recommendation for the vaccine. Verbal consent was obtained from the patient, vaccine administered by nurse, no sign of adverse reactions noted at this time. Patient education on arm soreness and use of tylenol  for this patient  was discussed. Patient educated on the signs and symptoms of adverse effect and advise to contact the office if they occur.  ?

## 2021-05-10 NOTE — Telephone Encounter (Signed)
Aaron Edelman called from [facility] in [place], received prescription for insulin delgudec Tyler Aas flextouch for 6 ml and the smallest size is 15 ml, they can not break the box to give the patient smaller ml. Please call Aaron Edelman back # [phone] ?

## 2021-05-10 NOTE — Assessment & Plan Note (Addendum)
BP Readings from Last 3 Encounters:  ?05/10/21 138/62  ?04/20/21 (!) 147/90  ?12/22/20 (!) 153/100  ?BP slightly elevated today,  ?Currently on Benicar 40 mg daily ?DASH diet advised engage in regular daily exercises at least 150 minutes weekly ?We will reevaluate BP at next visit and adjust medication if blood pressure is elevated.  ?

## 2021-05-10 NOTE — Assessment & Plan Note (Signed)
Neuropathy probably due to uncontrolled diabetes ?I do not suspect any infection today ?Start gabapentin 100 mg 3 times daily ?Follow-up in 2 months ?Take ibuprofen or Tylenol as needed for pain.  ?

## 2021-05-10 NOTE — Assessment & Plan Note (Addendum)
Lab Results  ?Component Value Date  ? CHOL 183 05/08/2021  ? HDL 56 05/08/2021  ? LDLCALC 107 (H) 05/08/2021  ? TRIG 109 05/08/2021  ? CHOLHDL 3.3 05/08/2021  ? ?Currently on atorvastatin '20mg'$  daily ?Start atorvastatin '40mg'$  daily. ?Avoid fried fatty foids ?Lipid panle in 8 weeks  ? ? ?

## 2021-05-10 NOTE — Assessment & Plan Note (Addendum)
Lab Results  ?Component Value Date  ? HGBA1C 12.8 (H) 05/08/2021  ? ?Uncontrolled chronic condition ?On Tresiba 24 units at bedtime, sliding scale NovoLog. ?Patient encouraged to stay away from any food containing sugar soda juice engage in daily exercises at least 30 minutes. ?Need to get blood sugar under control discussed with patient she verbalized understanding. ?Urgent referral placed to endocrinology. ?Diabetic eye exam ordered today ?Patient referred for diabetes education ?Urine microalbumin labs ordered today ?On ACE and statin ?

## 2021-05-11 ENCOUNTER — Other Ambulatory Visit: Payer: Self-pay

## 2021-05-11 ENCOUNTER — Telehealth: Payer: Self-pay | Admitting: Nurse Practitioner

## 2021-05-11 DIAGNOSIS — E1165 Type 2 diabetes mellitus with hyperglycemia: Secondary | ICD-10-CM

## 2021-05-11 MED ORDER — TRESIBA FLEXTOUCH 100 UNIT/ML ~~LOC~~ SOPN: 24.0000 [IU] | PEN_INJECTOR | Freq: Every day | SUBCUTANEOUS | 2 refills | Status: DC

## 2021-05-11 NOTE — Telephone Encounter (Signed)
Please advise 

## 2021-05-11 NOTE — Telephone Encounter (Signed)
Sent in correct amount  ?

## 2021-05-11 NOTE — Telephone Encounter (Signed)
Aaron Edelman from [facility] in [place] called back in on patient behalf in regard to last tele message ? ?insulin degludec (TRESIBA FLEXTOUCH) 100 UNIT/ML FlexTouch Pen  ? ? ?

## 2021-05-12 NOTE — Telephone Encounter (Signed)
corrected

## 2021-05-18 ENCOUNTER — Ambulatory Visit: Payer: 59 | Admitting: Urology

## 2021-05-18 NOTE — Progress Notes (Deleted)
Assessment: 1. Urinary frequency   2. Incomplete bladder emptying      Plan: Given her incomplete bladder emptying, I recommended that she stop the Myrbetriq. Return to office in 3-4 weeks for reevaluation with bladder scan.  Chief Complaint: No chief complaint on file.   HPI: Samantha Blackburn is a 41 y.o. female who presents for continued evaluation of left renal mass.  She reported onset of nausea, diarrhea, and left flank and back pain.  She was also having some nausea and vomiting.  No fever.  She was seen at urgent care and diagnosed with a UTI on 09/22/2020.  Urine culture showed mixed flora.  She was treated with Keflex.  She had worsening of her symptoms with continued nausea, vomiting, left-sided back pain, and chills.  She presented to the emergency room on 09/26/2020.  CT imaging showed a 1.8 cm rounded low-density lesion in the midpole cortex of the left kidney which was new in comparison to a study from 07/14/2020.  She was started on IV antibiotics.  Urine and blood cultures showed no growth.  A MRI from 09/27/2020 showed a rounded lesion with a thin rim of subtle hyperintensity measuring 1.5 cm and felt to be suggestive of a small renal abscess.  She continued on IV Rocephin and was discharged on 09/29/2020.  Since discharge she noted gradual improvement in her symptoms.  No fever or chills.  No dysuria or gross hematuria.  She continued to have intermittent discomfort in the left flank and back area.  She has completed the course of IV Rocephin. No history of recurrent UTIs.  No history of kidney stones.  CT from 10/17/2020 showed low-attenuation lesion in the cortex of the left lower pole with surrounding hypoenhancement measuring 1.4 x 0.8 cm, no evidence of obstruction, and a small nonobstructing stone in the lower pole of the left kidney.  At the time of her visit on 11/22/20, she was not having any fevers or chills.  She reported some bilateral low back pain.  No left-sided  flank pain.  No dysuria or gross hematuria.  She was having frequency and urgency.  No urinary incontinence. She was given samples of Myrbetriq 25 mg daily for her urinary symptoms. CT imaging from 12/19/2020 showed continued decrease in size of the low-attenuation cortical lesion in the left lower pole now measuring 9 x 4 mm in size.  CT imaging from 04/19/2021 showed resolution of the previously seen low attenuating cortical lesion in the left lower pole, no solid enhancing renal masses, no evidence of obstruction.  At her visit on 04/20/2021, she was not having any flank pain, dysuria, or gross hematuria.  She continued on Myrbetriq 25 mg daily.  She continued to have some symptoms of frequency, urgency, and sensation of incomplete emptying. PVR = 216 mL.  The Myrbetriq was discontinued.   Portions of the above documentation were copied from a prior visit for review purposes only.  Allergies: Allergies  Allergen Reactions   Morphine     PMH: Past Medical History:  Diagnosis Date   Diabetes mellitus without complication ([place])    Diabetes mellitus, new onset ([place]) 11/29/2012   Obesity 12/01/2012   Trichimoniasis     PSH: Past Surgical History:  Procedure Laterality Date   CHOLECYSTECTOMY      SH: Social History   Tobacco Use   Smoking status: Former    Packs/day: 1.00    Years: 10.00    Pack years: 10.00    Types:  Cigarettes    Quit date: 05/10/2010    Years since quitting: 11.0   Smokeless tobacco: Never  Vaping Use   Vaping Use: Never used  Substance Use Topics   Alcohol use: No   Drug use: No    ROS: Constitutional:  Negative for fever, chills, weight loss CV: Negative for chest pain, previous MI, hypertension Respiratory:  Negative for shortness of breath, wheezing, sleep apnea, frequent cough GI:  Negative for nausea, vomiting, bloody stool, GERD  PE: There were no vitals taken for this visit. GENERAL APPEARANCE:  Well appearing, well developed, well  nourished, NAD HEENT:  Atraumatic, normocephalic, oropharynx clear NECK:  Supple without lymphadenopathy or thyromegaly ABDOMEN:  Soft, non-tender, no masses EXTREMITIES:  Moves all extremities well, without clubbing, cyanosis, or edema NEUROLOGIC:  Alert and oriented x 3, normal gait, CN II-XII grossly intact MENTAL STATUS:  appropriate BACK:  Non-tender to palpation, No CVAT SKIN:  Warm, dry, and intact   Results: U/A:

## 2021-06-01 ENCOUNTER — Telehealth: Payer: Self-pay | Admitting: Nurse Practitioner

## 2021-06-01 ENCOUNTER — Telehealth: Payer: Self-pay

## 2021-06-01 ENCOUNTER — Other Ambulatory Visit: Payer: Self-pay

## 2021-06-01 DIAGNOSIS — E1165 Type 2 diabetes mellitus with hyperglycemia: Secondary | ICD-10-CM

## 2021-06-01 MED ORDER — TRESIBA FLEXTOUCH 100 UNIT/ML ~~LOC~~ SOPN: PEN_INJECTOR | SUBCUTANEOUS | 2 refills | Status: DC

## 2021-06-01 NOTE — Telephone Encounter (Signed)
Called pt to advised that prior auth not needed for tresiba no answer left vm

## 2021-06-02 NOTE — Telephone Encounter (Signed)
Spoke with pt advised to only do 24 units of tresiba pt verbalized understanding

## 2021-06-06 ENCOUNTER — Ambulatory Visit: Payer: 59

## 2021-06-12 ENCOUNTER — Ambulatory Visit: Payer: 59 | Admitting: Nutrition

## 2021-06-15 ENCOUNTER — Ambulatory Visit: Payer: 59 | Admitting: Nutrition

## 2021-07-07 ENCOUNTER — Ambulatory Visit (INDEPENDENT_AMBULATORY_CARE_PROVIDER_SITE_OTHER): Payer: 59 | Admitting: Nurse Practitioner

## 2021-07-07 ENCOUNTER — Encounter: Payer: Self-pay | Admitting: Nurse Practitioner

## 2021-07-07 VITALS — BP 131/87 | HR 99 | Ht 63.0 in | Wt 140.0 lb

## 2021-07-07 DIAGNOSIS — E785 Hyperlipidemia, unspecified: Secondary | ICD-10-CM | POA: Diagnosis not present

## 2021-07-07 DIAGNOSIS — E1165 Type 2 diabetes mellitus with hyperglycemia: Secondary | ICD-10-CM

## 2021-07-07 DIAGNOSIS — Z794 Long term (current) use of insulin: Secondary | ICD-10-CM

## 2021-07-07 DIAGNOSIS — I152 Hypertension secondary to endocrine disorders: Secondary | ICD-10-CM

## 2021-07-07 DIAGNOSIS — G629 Polyneuropathy, unspecified: Secondary | ICD-10-CM

## 2021-07-07 DIAGNOSIS — E1159 Type 2 diabetes mellitus with other circulatory complications: Secondary | ICD-10-CM

## 2021-07-07 MED ORDER — GABAPENTIN 300 MG PO CAPS: 300.0000 mg | ORAL_CAPSULE | Freq: Three times a day (TID) | ORAL | 3 refills | Status: DC

## 2021-07-07 NOTE — Assessment & Plan Note (Signed)
BP Readings from Last 3 Encounters:  07/07/21 131/87  05/10/21 138/62  04/20/21 (!) 147/90  Chronic condition well-controlled on olmesartan 40 mg daily, Continue current medication DASH diet advised engage in regular exercise at least 150 minutes weekly Follow-up in 4 months

## 2021-07-07 NOTE — Progress Notes (Signed)
   Samantha Blackburn     MRN: [id number]      DOB: [birth year]   HPI Ms. Bither with past medical history of uncontrolled type 2 diabetes, hyperlipidemia, neuropathy, is here for follow up for hyperlipidemia .   type 2 diabetes .currently on NovoLog sliding scale insulin, she has stopped taking tresiba due to the cost, patient stated that she has established care with endocrinologist and she has upcoming appointment with them in August .  She does not remember the name of her endocrinologist.    Neuropathy .  Patient stated that the tingling and burning sensation on her right thigh has not improved she has been taking Neurontin 100 mg 3 times daily, she has had to use lidocaine patch to help her symptoms , patient stated that the neuropathy in her bilateral foot is better during during the day but  worse at night.   Has upcoming diabetic eye exam next week Friday    ROS Denies recent fever or chills. Denies sinus pressure, nasal congestion, ear pain or sore throat. Denies chest congestion, productive cough or wheezing. Denies chest pains, palpitations and leg swelling Denies abdominal pain, nausea, vomiting,diarrhea or constipation.   Denies dysuria, frequency, hesitancy or incontinence. Denies joint pain, swelling and limitation in mobility. Denies headaches, seizures,  Denies depression, anxiety or insomnia. Denies skin break down or rash.   PE  BP 131/87 (BP Location: Right Arm, Patient Position: Sitting, Cuff Size: Normal)   Pulse 99   Ht 5\' 3"  (1.6 m)   Wt 140 lb (63.5 kg)   SpO2 98%   BMI 24.80 kg/m   Patient alert and oriented and in no cardiopulmonary distress..  Chest: Clear to auscultation bilaterally.  CVS: S1, S2 no murmurs, no S3.Regular rate.  ABD: Soft non tender.   Ext: No edema  MS: Adequate ROM spine, shoulders, hips and knees.  Skin: Intact, no ulcerations or rash noted, right thigh sensitive to touch,voiced burning sensation on the right thigh when  touched, intact sensation on monofilament examination of bilateral feet.  Psych: Good eye contact, normal affect. Memory intact not anxious or depressed appearing.  Assessment & Plan  Hypertension associated with diabetes (HCC) BP Readings from Last 3 Encounters:  07/07/21 131/87  05/10/21 138/62  04/20/21 (!) 147/90  Chronic condition well-controlled on olmesartan 40 mg daily, Continue current medication DASH diet advised engage in regular exercise at least 150 minutes weekly Follow-up in 4 months  Type 2 diabetes mellitus with hyperglycemia (HCC) Lab Results  Component Value Date   HGBA1C 12.8 (H) 05/08/2021  Patient stated that she has established care with endocrinologist has follow-up appointment with them in August Currently on NovoLog sliding scale, not taking Evaristo Bury due to cost patient stated that she has informed her endocrinologist about this.  Denies hypoglycemic episodes Avoid sugar sweets soda Patient encouraged to maintain close follow-up with endocrinologist she verbalized understanding Diabetic foot exam completed today, has upcoming diabetic eye exam  Neuropathy Well-controlled condition Has burning sensation on her right thigh not improved with gabapentin 100 mg 3 times daily Has bilateral feet neuropathy worse in the back Start gabapentin 300 mg 3 times daily Referral sent to neurologist Apply lidocaine patch to her right thigh as needed  Hyperlipidemia LDL goal <70 Currently on atorvastatin 40 mg daily Check lipid panel LDL goal is less than 70 Avoid fried fatty foods

## 2021-07-08 LAB — LIPID PANEL
Chol/HDL Ratio: 3.1 ratio (ref 0.0–4.4)
Cholesterol, Total: 168 mg/dL (ref 100–199)
HDL: 55 mg/dL (ref >39)
LDL Chol Calc (NIH): 102 mg/dL — ABNORMAL HIGH (ref 0–99)
Triglycerides: 56 mg/dL (ref 0–149)
VLDL Cholesterol Cal: 11 mg/dL (ref 5–40)

## 2021-07-11 ENCOUNTER — Telehealth: Payer: Self-pay

## 2021-07-11 NOTE — Telephone Encounter (Signed)
Returning lab result call 

## 2021-07-26 ENCOUNTER — Other Ambulatory Visit: Payer: Self-pay | Admitting: Nurse Practitioner

## 2021-07-26 ENCOUNTER — Telehealth: Payer: Self-pay | Admitting: Nurse Practitioner

## 2021-07-26 MED ORDER — GABAPENTIN 600 MG PO TABS: 600.0000 mg | ORAL_TABLET | Freq: Three times a day (TID) | ORAL | 3 refills | Status: DC

## 2021-07-26 MED ORDER — GABAPENTIN 300 MG PO CAPS: 300.0000 mg | ORAL_CAPSULE | Freq: Three times a day (TID) | ORAL | 3 refills | Status: DC

## 2021-07-26 NOTE — Telephone Encounter (Signed)
Spoke with pt advised medicine increased to 600 mg 3 times daily per [facility] since she is already taking 300 mg 3 times daily pt verbalized understanding

## 2021-07-26 NOTE — Telephone Encounter (Signed)
Please advised last visit 6/23

## 2021-07-26 NOTE — Progress Notes (Unsigned)
gabapen

## 2021-07-26 NOTE — Telephone Encounter (Signed)
Pt called stating that the gabapentin is not working and wants to know if this can please be changed?    [facility] [place]

## 2021-08-07 ENCOUNTER — Ambulatory Visit: Payer: 59 | Admitting: Nutrition

## 2021-10-19 ENCOUNTER — Telehealth: Payer: Self-pay

## 2021-10-19 NOTE — Telephone Encounter (Signed)
Return patient call from voice mail. Patient states she is having pain that starts at the top of her should blade that radiates down her back to her left flank. Patient voiced that her PCP believes that she may have a cyst that rupture and advised her to follow up with urology. Made patient aware that I can schedule her to come in 10/06 to see Sharee Pimple the PA for further evaluation. Patient voiced understanding.

## 2021-10-20 ENCOUNTER — Ambulatory Visit (INDEPENDENT_AMBULATORY_CARE_PROVIDER_SITE_OTHER): Payer: 59 | Admitting: Physician Assistant

## 2021-10-20 VITALS — BP 102/71 | HR 97 | Ht 63.0 in | Wt 140.0 lb

## 2021-10-20 DIAGNOSIS — N151 Renal and perinephric abscess: Secondary | ICD-10-CM

## 2021-10-20 DIAGNOSIS — R1012 Left upper quadrant pain: Secondary | ICD-10-CM

## 2021-10-20 DIAGNOSIS — N939 Abnormal uterine and vaginal bleeding, unspecified: Secondary | ICD-10-CM

## 2021-10-20 DIAGNOSIS — R109 Unspecified abdominal pain: Secondary | ICD-10-CM | POA: Diagnosis not present

## 2021-10-20 DIAGNOSIS — N200 Calculus of kidney: Secondary | ICD-10-CM

## 2021-10-20 LAB — POCT URINALYSIS DIPSTICK
Blood, UA: NEGATIVE
Glucose, UA: POSITIVE — AB
Leukocytes, UA: NEGATIVE
Nitrite, UA: NEGATIVE
Protein, UA: POSITIVE — AB
Spec Grav, UA: 1.01 (ref 1.010–1.025)
Urobilinogen, UA: 0.2 E.U./dL
pH, UA: 5.5 (ref 5.0–8.0)

## 2021-10-20 NOTE — Progress Notes (Signed)
Assessment: 1. Flank pain - POCT urinalysis dipstick  2. Left upper quadrant abdominal pain - CT RENAL STONE STUDY  3. Bilateral nephrolithiasis  4. Abnormal uterine bleeding  5. Renal abscess, left    Plan: Discussed findings and urologic plan of care at length with the patient.  CT stone study ordered to be done as soon as possible the patient is advised to follow-up with her gynecologist recent abdominal pain followed by uterine bleeding which is unusual for the patient due to the birth control she is on.  Strict ED precautions discussed and if the patient's pain recurs or if she develops fever, chills over the weekend, she is encouraged to go to the emergency department immediately.  After reviewing CT stone study, will call patient with results and plan follow-up visit if indicated.   Also discussed urine dip findings with the patient today.    Chief Complaint: No chief complaint on file.   HPI: Samantha Blackburn is a 41 y.o. female with history of left-sided renal abscess earlier this year who presents for evaluation of 5-day history of severe lower abdominal pain, upper back pain between the patient's shoulder blades radiating down her back and to the right flank.  Patient states symptoms began 5 days ago while she was sleeping and she woke up with significant vaginal bleeding having occurred which is unusual for her because of her form of birth control.  Patient pain is constant worsening intermittently.  She denies fever, chills, vomiting, but has had nausea.  No further vaginal bleeding reported and no hematuria reported.  She denies dysuria, increased frequency, urgency, gross hematuria.  She denies shortness of breath or chest pain.  She has had no evaluation yesterday who recommended she follow-up with [place] because of her history of renal abscess.. Patient never went for follow-up renal ultrasound as ordered.   Previous abdominal imaging studies reviewed and patient noted  to have bilateral punctate stone in left lower pole and mid right kidney.  Patient reports she is unaware that she had stones.  She has not had a stone event in the past.  Patient reports recent changes in her diabetic medications and is working to get her glucose levels under better control.  UA= 3+ glucose, 1+ bilirubin, 1+ ketones, 1+ protein, no blood, leukoesterase, nitrite negative   Portions of the above documentation were copied from a prior visit for review purposes only.  Allergies: Allergies  Allergen Reactions   Morphine     PMH: Past Medical History:  Diagnosis Date   Diabetes mellitus without complication ([place])    Diabetes mellitus, new onset ([place]) 11/29/2012   Obesity 12/01/2012   Trichimoniasis     PSH: Past Surgical History:  Procedure Laterality Date   CHOLECYSTECTOMY      SH: Social History   Tobacco Use   Smoking status: Former    Packs/day: 1.00    Years: 10.00    Total pack years: 10.00    Types: Cigarettes    Quit date: 05/10/2010    Years since quitting: 11.4   Smokeless tobacco: Never  Vaping Use   Vaping Use: Never used  Substance Use Topics   Alcohol use: No   Drug use: No    ROS: All other review of systems were reviewed and are negative except what is noted above in HPI  PE: BP 102/71   Pulse 97   Ht '5\' 3"'$  (1.6 m)   Wt 140 lb (63.5 kg)   BMI 24.80  kg/m  GENERAL APPEARANCE:  Well appearing, well developed, well nourished, patient appears mildly uncomfortable.  Unlabored respirations HEENT:  Atraumatic, normocephalic, sclera are not icteric NECK:  Supple. Trachea midline ABDOMEN:  Soft, non-tender, no masses EXTREMITIES:  Moves all extremities well, without clubbing, cyanosis, or edema NEUROLOGIC:  Alert and oriented x 3, normal gait, CN II-XII grossly intact MENTAL STATUS:  appropriate BACK: Diffusely tender to palpation and left-sided CVA tenderness SKIN:  Warm, dry, and intact, no rashes or lesions appreciated, patient does  not appear jaundiced   Results: Laboratory Data: Lab Results  Component Value Date   WBC 8.4 05/08/2021   HGB 14.8 05/08/2021   HCT 43.2 05/08/2021   MCV 92 05/08/2021   PLT 248 05/08/2021    Lab Results  Component Value Date   CREATININE 0.64 05/08/2021     Lab Results  Component Value Date   HGBA1C 12.8 (H) 05/08/2021    Urinalysis    Component Value Date/Time   COLORURINE YELLOW 08/21/2019 0859   APPEARANCEUR Clear 04/20/2021 1325   LABSPEC 1.026 08/21/2019 0859   PHURINE 5.0 08/21/2019 0859   GLUCOSEU 3+ (A) 04/20/2021 1325   HGBUR MODERATE (A) 08/21/2019 0859   BILIRUBINUR postive 10/20/2021 1041   BILIRUBINUR Negative 04/20/2021 1325   KETONESUR 80 (A) 08/21/2019 0859   PROTEINUR Positive (A) 10/20/2021 1041   PROTEINUR Negative 04/20/2021 1325   PROTEINUR 100 (A) 08/21/2019 0859   UROBILINOGEN 0.2 10/20/2021 1041   UROBILINOGEN 0.2 11/29/2012 2147   NITRITE negative 10/20/2021 1041   NITRITE Negative 04/20/2021 1325   NITRITE NEGATIVE 08/21/2019 0859   LEUKOCYTESUR Negative 10/20/2021 1041   LEUKOCYTESUR Negative 04/20/2021 1325   LEUKOCYTESUR NEGATIVE 08/21/2019 0859    Lab Results  Component Value Date   LABMICR Comment 04/20/2021   WBCUA 0-5 12/22/2020   LABEPIT 0-10 12/22/2020   BACTERIA Few 12/22/2020    Pertinent Imaging: No results found for this or any previous visit.  No results found for this or any previous visit.  No results found for this or any previous visit.  No results found for this or any previous visit.  No results found for this or any previous visit.  No valid procedures specified. No results found for this or any previous visit.  No results found for this or any previous visit.  Results for orders placed or performed in visit on 10/20/21 (from the past 24 hour(s))  POCT urinalysis dipstick   Collection Time: 10/20/21 10:41 AM  Result Value Ref Range   Color, UA yellow    Clarity, UA clear    Glucose, UA  Positive (A) Negative   Bilirubin, UA postive    Ketones, UA postive    Spec Grav, UA 1.010 1.010 - 1.025   Blood, UA negative    pH, UA 5.5 5.0 - 8.0   Protein, UA Positive (A) Negative   Urobilinogen, UA 0.2 0.2 or 1.0 E.U./dL   Nitrite, UA negative    Leukocytes, UA Negative Negative   Appearance     Odor

## 2021-10-22 ENCOUNTER — Ambulatory Visit (HOSPITAL_BASED_OUTPATIENT_CLINIC_OR_DEPARTMENT_OTHER)
Admission: RE | Admit: 2021-10-22 | Discharge: 2021-10-22 | Disposition: A | Discharge status: Home / Self Care | Payer: 59 | Source: Ambulatory Visit | Attending: Physician Assistant | Admitting: Physician Assistant

## 2021-10-22 DIAGNOSIS — R1012 Left upper quadrant pain: Secondary | ICD-10-CM | POA: Diagnosis present

## 2021-10-23 ENCOUNTER — Other Ambulatory Visit: Payer: Self-pay

## 2021-10-23 ENCOUNTER — Telehealth: Payer: Self-pay

## 2021-10-23 DIAGNOSIS — R822 Biliuria: Secondary | ICD-10-CM

## 2021-10-23 NOTE — Telephone Encounter (Signed)
Spoke with patient and made her aware that results was received and once PA reviews them someone will contact her with her recommendations. Patient voiced understanding.

## 2021-10-23 NOTE — Telephone Encounter (Signed)
Put orders in for blood work.

## 2021-10-23 NOTE — Telephone Encounter (Signed)
Patient called was seen Dr Leta Jungling Urologist [address] last week and needs blood work ordered [email or web address]  .    Please contact patient if these labs will be ordered.

## 2021-10-23 NOTE — Telephone Encounter (Signed)
Patient called asking if we had the results of her CT. I told her that the nurse would give her a call. She is asking for a return call at  [phone].

## 2021-10-25 LAB — HEPATIC FUNCTION PANEL
ALT: 11 IU/L (ref 0–32)
AST: 11 IU/L (ref 0–40)
Albumin: 3.6 g/dL — ABNORMAL LOW (ref 3.9–4.9)
Alkaline Phosphatase: 54 IU/L (ref 44–121)
Bilirubin Total: 0.4 mg/dL (ref 0.0–1.2)
Bilirubin, Direct: 0.13 mg/dL (ref 0.00–0.40)
Total Protein: 5.8 g/dL — ABNORMAL LOW (ref 6.0–8.5)

## 2021-11-10 ENCOUNTER — Encounter: Payer: 59 | Admitting: Nurse Practitioner

## 2021-11-13 ENCOUNTER — Ambulatory Visit (INDEPENDENT_AMBULATORY_CARE_PROVIDER_SITE_OTHER): Payer: 59 | Admitting: Internal Medicine

## 2021-11-13 ENCOUNTER — Encounter: Payer: Self-pay | Admitting: Internal Medicine

## 2021-11-13 VITALS — BP 136/85 | HR 102 | Ht 64.0 in | Wt 142.2 lb

## 2021-11-13 DIAGNOSIS — I152 Hypertension secondary to endocrine disorders: Secondary | ICD-10-CM

## 2021-11-13 DIAGNOSIS — E1165 Type 2 diabetes mellitus with hyperglycemia: Secondary | ICD-10-CM | POA: Diagnosis not present

## 2021-11-13 DIAGNOSIS — G5711 Meralgia paresthetica, right lower limb: Secondary | ICD-10-CM | POA: Diagnosis not present

## 2021-11-13 DIAGNOSIS — E1159 Type 2 diabetes mellitus with other circulatory complications: Secondary | ICD-10-CM

## 2021-11-13 DIAGNOSIS — Z0001 Encounter for general adult medical examination with abnormal findings: Secondary | ICD-10-CM

## 2021-11-13 DIAGNOSIS — Z794 Long term (current) use of insulin: Secondary | ICD-10-CM

## 2021-11-13 DIAGNOSIS — G629 Polyneuropathy, unspecified: Secondary | ICD-10-CM | POA: Diagnosis not present

## 2021-11-13 MED ORDER — GABAPENTIN 800 MG PO TABS: 800.0000 mg | ORAL_TABLET | Freq: Three times a day (TID) | ORAL | 2 refills | Status: DC

## 2021-11-13 NOTE — Progress Notes (Signed)
Complete physical exam  Patient: Samantha Blackburn   DOB: 04-Jan-1981   41 y.o. Female  MRN: [id number]  Subjective:    Chief Complaint  Patient presents with   Annual Exam   Samantha Blackburn is a 41 y.o. female who presents today for a complete physical exam. She reports consuming a general diet. The patient has a physically strenuous job, but has no regular exercise apart from work.  She generally feels fairly well. She reports sleeping well. She does have additional problems to discuss today.  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  She is currently prescribed gabapentin 600 mg 3 times daily, but continues to endorse uncontrolled symptoms.  Most recent fall risk assessment:    11/13/2021    9:38 AM  [place] in the past year? 0  Number falls in past yr: 0  Injury with Fall? 0  Risk for fall due to : No Fall Risks  Follow up Falls evaluation completed     Most recent depression screenings:    11/13/2021    9:39 AM 07/07/2021   10:13 AM  PHQ 2/9 Scores  PHQ - 2 Score 0 0   Vision:Within last year and Dental: No current dental problems and Receives regular dental care  Past Medical History:  Diagnosis Date   Diabetes mellitus without complication ([place])    Diabetes mellitus, new onset ([place]) 11/29/2012   Obesity 12/01/2012   Trichimoniasis    Past Surgical History:  Procedure Laterality Date   CHOLECYSTECTOMY     Social History   Tobacco Use   Smoking status: Former    Packs/day: 1.00    Years: 10.00    Total pack years: 10.00    Types: Cigarettes    Quit date: 05/10/2010    Years since quitting: 11.5   Smokeless tobacco: Never  Vaping Use   Vaping Use: Never used  Substance Use Topics   Alcohol use: No   Drug use: No   Family History  Problem Relation Age of Onset   Hypertension Mother    Diabetes Mother    Cancer Father        lymphoma, kidney,bladder, liver,prostate   Diabetes  Father    Heart attack Father    Diabetes Maternal Grandmother    Heart attack Maternal Grandfather    Stroke Paternal Grandmother    Allergies  Allergen Reactions   Cymbalta [Duloxetine Hcl]     Can't take it   Morphine    Patient Care Team: Samantha Abraham, MD as PCP - General (Internal Medicine)   Outpatient Medications Prior to Visit  Medication Sig   atorvastatin (LIPITOR) 40 MG tablet Take 1 tablet (40 mg total) by mouth daily.   blood glucose meter kit and supplies Dispense based on patient and insurance preference. Use up to four times daily as directed. (FOR ICD-10 E10.9, E11.9).   cetirizine (ZYRTEC) 10 MG tablet Take 1 tablet (10 mg total) by mouth daily.   insulin aspart (NOVOLOG) 100 UNIT/ML FlexPen Sliding scale TID with meals. Administer 7 units for blood sugar 70-139, 9 units for blood sugar 140-180, 11 units for blood sugar 181-240, 13 units for blood sugar 241-300, 15 units for blood sugar 301-350,  17 units for bloods sugar 351-400,  and call MD and administer 19 units if blood sugar > 400.   insulin degludec (TRESIBA FLEXTOUCH) 100 UNIT/ML FlexTouch Pen Inject 24 to 50 units into the  skin at bedtime.   Insulin Pen Needle (PEN NEEDLES) 33G X 4 MM MISC 1 each by Does not apply route 4 (four) times daily - after meals and at bedtime.   norgestimate-ethinyl estradiol (ORTHO-CYCLEN) 0.25-35 MG-MCG tablet Take 1 tablet by mouth daily.   olmesartan (BENICAR) 40 MG tablet Take 1 tablet (40 mg total) by mouth daily.   SLYND 4 MG TABS Take 1 tablet by mouth daily.   sodium chloride (OCEAN) 0.65 % SOLN nasal spray Place 1 spray into both nostrils as needed for congestion.   [DISCONTINUED] gabapentin (NEURONTIN) 600 MG tablet Take 1 tablet (600 mg total) by mouth 3 (three) times daily.   ibuprofen (ADVIL) 800 MG tablet Take 800 mg by mouth 3 (three) times daily. (Patient not taking: Reported on 10/20/2021)   No facility-administered medications prior to visit.    Review of  Systems  Musculoskeletal:        Neuropathic pain in the lower extremities bilaterally, right hip/thigh pain      Objective:     BP 136/85   Pulse (!) 102   Ht _0  (1.626 m)   Wt 142 lb 3.2 oz (64.5 kg)   SpO2 99%   BMI 24.41 kg/m  BP Readings from Last 3 Encounters:  11/13/21 136/85  10/20/21 102/71  07/07/21 131/87      Physical Exam Vitals reviewed.  Constitutional:      General: She is not in acute distress.    Appearance: Normal appearance. She is not toxic-appearing.  HENT:     Head: Normocephalic and atraumatic.     Right Ear: External ear normal.     Left Ear: External ear normal.     Nose: Nose normal. No congestion or rhinorrhea.     Mouth/Throat:     Mouth: Mucous membranes are moist.     Pharynx: Oropharynx is clear. No oropharyngeal exudate or posterior oropharyngeal erythema.  Eyes:     General: No scleral icterus.    Extraocular Movements: Extraocular movements intact.     Conjunctiva/sclera: Conjunctivae normal.     Pupils: Pupils are equal, round, and reactive to light.  Cardiovascular:     Rate and Rhythm: Normal rate and regular rhythm.     Pulses: Normal pulses.     Heart sounds: Normal heart sounds. No murmur heard.    No friction rub. No gallop.  Pulmonary:     Effort: Pulmonary effort is normal.     Breath sounds: Normal breath sounds. No wheezing, rhonchi or rales.  Abdominal:     General: Abdomen is flat. Bowel sounds are normal. There is no distension.     Palpations: Abdomen is soft.     Tenderness: There is no abdominal tenderness.  Musculoskeletal:        General: No swelling. Normal range of motion.     Cervical back: Normal range of motion.     Right lower leg: No edema.     Left lower leg: No edema.  Lymphadenopathy:     Cervical: No cervical adenopathy.  Skin:    General: Skin is warm and dry.     Capillary Refill: Capillary refill takes less than 2 seconds.     Coloration: Skin is not jaundiced.  Neurological:      General: No focal deficit present.     Mental Status: She is alert and oriented to person, place, and time.  Psychiatric:        Mood and Affect: Mood normal.  Behavior: Behavior normal.     Last CBC Lab Results  Component Value Date   WBC 8.4 05/08/2021   HGB 14.8 05/08/2021   HCT 43.2 05/08/2021   MCV 92 05/08/2021   MCH 31.5 05/08/2021   RDW 12.4 05/08/2021   PLT 248 73/41/9379   Last metabolic panel Lab Results  Component Value Date   GLUCOSE 424 (H) 11/13/2021   NA 136 11/13/2021   K 4.5 11/13/2021   CL 100 11/13/2021   CO2 21 11/13/2021   BUN 11 11/13/2021   CREATININE 0.64 11/13/2021   EGFR 114 11/13/2021   CALCIUM 9.8 11/13/2021   PROT 6.6 11/13/2021   ALBUMIN 3.9 11/13/2021   LABGLOB 2.7 11/13/2021   AGRATIO 1.4 11/13/2021   BILITOT 0.4 11/13/2021   ALKPHOS 79 11/13/2021   AST 11 11/13/2021   ALT 9 11/13/2021   ANIONGAP 13 08/21/2019   Last lipids Lab Results  Component Value Date   CHOL 168 07/07/2021   HDL 55 07/07/2021   LDLCALC 102 (H) 07/07/2021   TRIG 56 07/07/2021   CHOLHDL 3.1 07/07/2021   Last hemoglobin A1c Lab Results  Component Value Date   HGBA1C 11.4 (H) 11/13/2021   Last thyroid functions Lab Results  Component Value Date   TSH 3.112 11/29/2012       Assessment & Plan:    Routine Health Maintenance and Physical Exam  Immunization History  Administered Date(s) Administered   Influenza-Unspecified 11/08/2021   Moderna Sars-Covid-2 Vaccination 06/13/2020, 08/13/2020   Tdap 05/10/2021    Health Maintenance  Topic Date Due   OPHTHALMOLOGY EXAM  Never done   COVID-19 Vaccine (3 - Moderna risk series) 11/29/2021 (Originally 09/10/2020)   HEMOGLOBIN A1C  05/15/2022   FOOT EXAM  07/08/2022   Diabetic kidney evaluation - GFR measurement  11/14/2022   Diabetic kidney evaluation - Urine ACR  11/14/2022   PAP SMEAR-Modifier  01/21/2023   TETANUS/TDAP  05/11/2031   INFLUENZA VACCINE  Completed   Hepatitis C Screening   Completed   HIV Screening  Completed   HPV VACCINES  Aged Out    Discussed health benefits of physical activity, and encouraged her to engage in regular exercise appropriate for her age and condition.  Problem List Items Addressed This Visit       Hypertension associated with diabetes ([place])    BP 136/85 today.  She is currently prescribed olmesartan 40 mg daily.  She attributes her elevated readings to pain. -No medication changes today.  We will plan for follow-up in 1 month for BP check.  In the interim, have asked her to check her blood pressure at home and keep a log to bring to her next appointment.      Type 2 diabetes mellitus with hyperglycemia ([facility])    A1c 12.8 6 months ago.  She is currently prescribed Tresiba 35 units nightly and NovoLog sliding scale with meals.  She reports AM readings between 112-120 recently.  She currently denies symptoms of polyuria/polydipsia. -Repeat A1c today -Urine microalbumin/creatinine ratio ordered -Follow-up in 1 month      Neuropathy    Today she endorses poorly controlled neuropathic pain.  This is mostly located in her feet, she also has pain in her right thigh, which she attributes to meralgia paresthetica.  She is currently prescribed gabapentin 600 mg 3 times daily. -Increase gabapentin to 800 mg 3 times daily -Follow-up in 1 month for reassessment      Encounter for annual general medical examination with abnormal  findings in adult    Presenting today for her annual physical exam. -Vaccines UTD or otherwise declined -Repeat labs ordered today, including CMP and A1c -Urine microalbumin/creatinine ratio ordered -Follow-up in 1 month      Return in about 4 weeks (around 12/11/2021).  Samantha Abraham, MD

## 2021-11-13 NOTE — Patient Instructions (Signed)
It was a pleasure to see you today.  Thank you for giving [place] the opportunity to be involved in your care.  Below is a brief recap of your visit and next steps.  We will plan to see you again in 4 weeks.  Summary I have increased gabapentin today We will follow up in 4 weeks to discuss hypertension and diabetes. In the interim, please keep a log of your blood pressures and blood sugar.

## 2021-11-15 LAB — CMP14+EGFR
ALT: 9 IU/L (ref 0–32)
AST: 11 IU/L (ref 0–40)
Albumin/Globulin Ratio: 1.4 (ref 1.2–2.2)
Albumin: 3.9 g/dL (ref 3.9–4.9)
Alkaline Phosphatase: 79 IU/L (ref 44–121)
BUN/Creatinine Ratio: 17 (ref 9–23)
BUN: 11 mg/dL (ref 6–24)
Bilirubin Total: 0.4 mg/dL (ref 0.0–1.2)
CO2: 21 mmol/L (ref 20–29)
Calcium: 9.8 mg/dL (ref 8.7–10.2)
Chloride: 100 mmol/L (ref 96–106)
Creatinine, Ser: 0.64 mg/dL (ref 0.57–1.00)
Globulin, Total: 2.7 g/dL (ref 1.5–4.5)
Glucose: 424 mg/dL — ABNORMAL HIGH (ref 70–99)
Potassium: 4.5 mmol/L (ref 3.5–5.2)
Sodium: 136 mmol/L (ref 134–144)
Total Protein: 6.6 g/dL (ref 6.0–8.5)
eGFR: 114 mL/min/{1.73_m2} (ref >59)

## 2021-11-15 LAB — HEMOGLOBIN A1C
Est. average glucose Bld gHb Est-mCnc: 280 mg/dL
Hgb A1c MFr Bld: 11.4 % — ABNORMAL HIGH (ref 4.8–5.6)

## 2021-11-15 LAB — MICROALBUMIN / CREATININE URINE RATIO
Creatinine, Urine: 34.2 mg/dL
Microalb/Creat Ratio: 24 mg/g creat (ref 0–29)
Microalbumin, Urine: 8.2 ug/mL

## 2021-11-19 DIAGNOSIS — Z0001 Encounter for general adult medical examination with abnormal findings: Secondary | ICD-10-CM | POA: Insufficient documentation

## 2021-11-19 NOTE — Assessment & Plan Note (Signed)
A1c 12.8 6 months ago.  She is currently prescribed Tresiba 35 units nightly and NovoLog sliding scale with meals.  She reports AM readings between 112-120 recently.  She currently denies symptoms of polyuria/polydipsia. -Repeat A1c today -Urine microalbumin/creatinine ratio ordered -Follow-up in 1 month

## 2021-11-19 NOTE — Assessment & Plan Note (Signed)
Today she endorses poorly controlled neuropathic pain.  This is mostly located in her feet, she also has pain in her right thigh, which she attributes to meralgia paresthetica.  She is currently prescribed gabapentin 600 mg 3 times daily. -Increase gabapentin to 800 mg 3 times daily -Follow-up in 1 month for reassessment

## 2021-11-19 NOTE — Assessment & Plan Note (Signed)
BP 136/85 today.  She is currently prescribed olmesartan 40 mg daily.  She attributes her elevated readings to pain. -No medication changes today.  We will plan for follow-up in 1 month for BP check.  In the interim, have asked her to check her blood pressure at home and keep a log to bring to her next appointment.

## 2021-11-19 NOTE — Assessment & Plan Note (Signed)
Presenting today for her annual physical exam. -Vaccines UTD or otherwise declined -Repeat labs ordered today, including CMP and A1c -Urine microalbumin/creatinine ratio ordered -Follow-up in 1 month

## 2021-12-11 ENCOUNTER — Ambulatory Visit: Payer: 59 | Admitting: Internal Medicine

## 2021-12-14 ENCOUNTER — Ambulatory Visit
Admission: EM | Admit: 2021-12-14 | Discharge: 2021-12-14 | Disposition: A | Discharge status: Home / Self Care | Payer: 59 | Attending: Nurse Practitioner | Admitting: Nurse Practitioner

## 2021-12-14 DIAGNOSIS — J020 Streptococcal pharyngitis: Secondary | ICD-10-CM

## 2021-12-14 LAB — POCT RAPID STREP A (OFFICE): Rapid Strep A Screen: POSITIVE — AB

## 2021-12-14 MED ORDER — LIDOCAINE VISCOUS HCL 2 % MT SOLN: 5.0000 mL | Freq: Four times a day (QID) | OROMUCOSAL | 0 refills | Status: DC | PRN

## 2021-12-14 MED ORDER — PENICILLIN V POTASSIUM 500 MG PO TABS: 500.0000 mg | ORAL_TABLET | Freq: Two times a day (BID) | ORAL | 0 refills | Status: AC

## 2021-12-14 NOTE — ED Provider Notes (Signed)
[facility]    CSN: [id number] Arrival date & time: 12/14/21  0902      History   Chief Complaint Chief Complaint  Patient presents with   Sore Throat         HPI Nilah ZAMYIAH TINO is a 41 y.o. female.   The history is provided by the patient.   Patient presents for complaints of sore throat, postnasal drainage, left ear pain, headache, and nausea.  Symptoms started over the past 24 hours.  She denies fever, chills, cough, nasal congestion, runny nose, abdominal pain, vomiting, or diarrhea.  She reports that her son was diagnosed with strep.  She states that she has taken ibuprofen for her symptoms with minimal relief.  Past Medical History:  Diagnosis Date   Diabetes mellitus without complication ([place])    Diabetes mellitus, new onset ([place]) 11/29/2012   Obesity 12/01/2012   Trichimoniasis     Patient Active Problem List   Diagnosis Date Noted   Encounter for annual general medical examination with abnormal findings in adult 11/19/2021   Hyperlipidemia LDL goal <70 05/10/2021   Neuropathy 05/10/2021   Need for Tdap vaccination 05/10/2021   Hyponatremia 05/10/2021   Rhinosinusitis 04/05/2021   Common cold 12/06/2020   Renal abscess 10/12/2020   Abnormal physical evaluation 05/26/2020   Type 2 diabetes mellitus with hyperglycemia ([place]) 05/09/2020   Hypertension associated with diabetes ([place]) 05/09/2020   History of trichomoniasis 04/19/2016   Encounter to establish care 11/14/2015   Obesity 12/01/2012    Past Surgical History:  Procedure Laterality Date   CHOLECYSTECTOMY      OB History     Gravida  2   Para  2   Term  2   Preterm      AB      Living  2      SAB      IAB      Ectopic      Multiple      Live Births  2            Home Medications    Prior to Admission medications   Medication Sig Start Date End Date Taking? Authorizing Provider  lidocaine (XYLOCAINE) 2 % solution Use as directed 5 mLs in the mouth or  throat every 6 (six) hours as needed for mouth pain. 12/14/21  Yes Elin Fenley-Warren, Alda Lea, NP  penicillin v potassium (VEETID) 500 MG tablet Take 1 tablet (500 mg total) by mouth in the morning and at bedtime for 10 days. 12/14/21 12/24/21 Yes Dameisha Tschida-Warren, Alda Lea, NP  atorvastatin (LIPITOR) 40 MG tablet Take 1 tablet (40 mg total) by mouth daily. 05/10/21   Renee Rival, FNP  blood glucose meter kit and supplies Dispense based on patient and insurance preference. Use up to four times daily as directed. (FOR ICD-10 E10.9, E11.9). 05/09/20   Noreene Larsson, NP  cetirizine (ZYRTEC) 10 MG tablet Take 1 tablet (10 mg total) by mouth daily. 04/05/21   Paseda, Dewaine Conger, FNP  gabapentin (NEURONTIN) 800 MG tablet Take 1 tablet (800 mg total) by mouth 3 (three) times daily. 11/13/21 02/11/22  Johnette Abraham, MD  ibuprofen (ADVIL) 800 MG tablet Take 800 mg by mouth 3 (three) times daily. Patient not taking: Reported on 10/20/2021 05/01/21   [provider]  insulin aspart (NOVOLOG) 100 UNIT/ML FlexPen Sliding scale TID with meals. Administer 7 units for blood sugar 70-139, 9 units for blood sugar 140-180, 11 units for blood  sugar 181-240, 13 units for blood sugar 241-300, 15 units for blood sugar 301-350,  17 units for bloods sugar 351-400,  and call MD and administer 19 units if blood sugar > 400. 05/10/21   Paseda, Folashade R, FNP  insulin degludec (TRESIBA FLEXTOUCH) 100 UNIT/ML FlexTouch Pen Inject 24 to 50 units into the skin at bedtime. 06/01/21   Paseda, Dewaine Conger, FNP  Insulin Pen Needle (PEN NEEDLES) 33G X 4 MM MISC 1 each by Does not apply route 4 (four) times daily - after meals and at bedtime. 05/09/20   Noreene Larsson, NP  norgestimate-ethinyl estradiol (ORTHO-CYCLEN) 0.25-35 MG-MCG tablet Take 1 tablet by mouth daily.    [provider]  olmesartan (BENICAR) 40 MG tablet Take 1 tablet (40 mg total) by mouth daily. 10/12/20   Noreene Larsson, NP  SLYND 4 MG TABS Take 1  tablet by mouth daily. 05/04/21   [provider]  sodium chloride (OCEAN) 0.65 % SOLN nasal spray Place 1 spray into both nostrils as needed for congestion. 04/05/21   Renee Rival, FNP    Family History Family History  Problem Relation Age of Onset   Hypertension Mother    Diabetes Mother    Cancer Father        lymphoma, kidney,bladder, liver,prostate   Diabetes Father    Heart attack Father    Diabetes Maternal Grandmother    Heart attack Maternal Grandfather    Stroke Paternal Grandmother     Social History Social History   Tobacco Use   Smoking status: Former    Packs/day: 1.00    Years: 10.00    Total pack years: 10.00    Types: Cigarettes    Quit date: 05/10/2010    Years since quitting: 11.6   Smokeless tobacco: Never  Vaping Use   Vaping Use: Never used  Substance Use Topics   Alcohol use: No   Drug use: No     Allergies   Cymbalta [duloxetine hcl] and Morphine   Review of Systems Review of Systems Per HPI  Physical Exam Triage Vital Signs ED Triage Vitals  Enc Vitals Group     BP 12/14/21 0959 124/84     Pulse Rate 12/14/21 0959 (!) 115     Resp 12/14/21 0959 16     Temp 12/14/21 0959 99.2 F (37.3 C)     Temp Source 12/14/21 0959 Oral     SpO2 12/14/21 0959 96 %     Weight --      Height --      Head Circumference --      Peak Flow --      Pain Score 12/14/21 1000 8     Pain Loc --      Pain Edu? --      Excl. in [place]? --    No data found.  Updated Vital Signs BP 124/84 (BP Location: Right Arm)   Pulse (!) 115   Temp 99.2 F (37.3 C) (Oral)   Resp 16   SpO2 96%   Visual Acuity Right Eye Distance:   Left Eye Distance:   Bilateral Distance:    Right Eye Near:   Left Eye Near:    Bilateral Near:     Physical Exam Vitals and nursing note reviewed.  Constitutional:      General: She is not in acute distress.    Appearance: She is well-developed.  HENT:     Head: Normocephalic.     Right  Ear: Tympanic  membrane and ear canal normal.     Left Ear: Tympanic membrane and ear canal normal.     Nose: No congestion.     Mouth/Throat:     Lips: Pink.     Pharynx: Pharyngeal swelling, posterior oropharyngeal erythema and uvula swelling present. No oropharyngeal exudate.     Tonsils: No tonsillar exudate. 2+ on the right. 2+ on the left.  Eyes:     Conjunctiva/sclera: Conjunctivae normal.     Pupils: Pupils are equal, round, and reactive to light.  Cardiovascular:     Rate and Rhythm: Normal rate and regular rhythm.     Heart sounds: Normal heart sounds.  Pulmonary:     Effort: Pulmonary effort is normal.     Breath sounds: Normal breath sounds.  Abdominal:     General: Bowel sounds are normal.     Palpations: Abdomen is soft.     Tenderness: There is no abdominal tenderness.  Musculoskeletal:     Cervical back: Normal range of motion.  Lymphadenopathy:     Cervical: No cervical adenopathy.  Skin:    General: Skin is warm and dry.  Neurological:     General: No focal deficit present.     Mental Status: She is alert and oriented to person, place, and time.  Psychiatric:        Mood and Affect: Mood normal.        Behavior: Behavior normal.      UC Treatments / Results  Labs (all labs ordered are listed, but only abnormal results are displayed) Labs Reviewed  POCT RAPID STREP A (OFFICE) - Abnormal; Notable for the following components:      Result Value   Rapid Strep A Screen Positive (*)    All other components within normal limits    EKG   Radiology No results found.  Procedures Procedures (including critical care time)  Medications Ordered in UC Medications - No data to display  Initial Impression / Assessment and Plan / UC Course  I have reviewed the triage vital signs and the nursing notes.  Pertinent labs & imaging results that were available during my care of the patient were reviewed by me and considered in my medical decision making (see chart for  details).  Patient presents for complaints of headache, sore throat, left ear pain, and nausea that have been present over the past 24 hours.  On exam, patient is tachycardic, but otherwise well-appearing, and is in no acute distress.  Rapid strep test is positive.  Will treat patient for strep throat with penicillin VK 500 mg.  Also for her throat pain, viscous lidocaine 2% provided to gargle and spit as needed.  Supportive care recommendations were provided to the patient to include warm salt water gargles, continuing over-the-counter ibuprofen or Tylenol, and increasing fluids and allowing for plenty of rest.  Patient was also advised to discard her toothbrush after 3 days.  Patient verbalizes understanding.  All questions were answered.  Work note was provided.  Patient is stable for discharge. Final Clinical Impressions(s) / UC Diagnoses   Final diagnoses:  Streptococcal sore throat     Discharge Instructions       Take medication as prescribed. Increase fluids and allow for plenty of rest. Recommend over-the-counter Tylenol or ibuprofen as needed for pain, fever, or general discomfort. Warm salt water gargles 3-4 times daily to help with throat pain or discomfort. Recommend a diet with soft foods to include soups, broths, puddings,  yogurt, Jell-O's, or popsicles until symptoms improve. Change toothbrush after 3 days. Follow-up in this clinic or with your primary care physician if symptoms do not improve.     ED Prescriptions     Medication Sig Dispense Auth. Provider   penicillin v potassium (VEETID) 500 MG tablet Take 1 tablet (500 mg total) by mouth in the morning and at bedtime for 10 days. 20 tablet Taraneh Metheney-Warren, Alda Lea, NP   lidocaine (XYLOCAINE) 2 % solution Use as directed 5 mLs in the mouth or throat every 6 (six) hours as needed for mouth pain. 100 mL Kasim Mccorkle-Warren, Alda Lea, NP      PDMP not reviewed this encounter.   Tish Men, NP 12/14/21  1042

## 2021-12-14 NOTE — Discharge Instructions (Addendum)
  Take medication as prescribed. Increase fluids and allow for plenty of rest. Recommend over-the-counter Tylenol or ibuprofen as needed for pain, fever, or general discomfort. Warm salt water gargles 3-4 times daily to help with throat pain or discomfort. Recommend a diet with soft foods to include soups, broths, puddings, yogurt, Jell-O's, or popsicles until symptoms improve. Change toothbrush after 3 days. Follow-up in this clinic or with your primary care physician if symptoms do not improve.

## 2021-12-14 NOTE — ED Triage Notes (Signed)
Pt reports left ear pain, sore throat and headache x 1 day. Son has Strep.

## 2021-12-18 ENCOUNTER — Other Ambulatory Visit: Payer: Self-pay

## 2021-12-29 ENCOUNTER — Ambulatory Visit: Payer: 59 | Admitting: Internal Medicine

## 2022-01-12 ENCOUNTER — Other Ambulatory Visit: Payer: Self-pay | Admitting: Internal Medicine

## 2022-01-12 DIAGNOSIS — G5711 Meralgia paresthetica, right lower limb: Secondary | ICD-10-CM

## 2022-01-20 ENCOUNTER — Telehealth: Payer: 59 | Admitting: Family Medicine

## 2022-01-20 DIAGNOSIS — U071 COVID-19: Secondary | ICD-10-CM | POA: Diagnosis not present

## 2022-01-20 MED ORDER — PREDNISONE 20 MG PO TABS: 20.0000 mg | ORAL_TABLET | Freq: Two times a day (BID) | ORAL | 0 refills | Status: AC

## 2022-01-20 MED ORDER — BENZONATATE 200 MG PO CAPS: 200.0000 mg | ORAL_CAPSULE | Freq: Two times a day (BID) | ORAL | 0 refills | Status: DC | PRN

## 2022-01-20 MED ORDER — MOLNUPIRAVIR EUA 200MG CAPSULE: 4.0000 | ORAL_CAPSULE | Freq: Two times a day (BID) | ORAL | 0 refills | Status: DC

## 2022-01-20 NOTE — Progress Notes (Signed)
Virtual Visit Consent   Samantha Blackburn, you are scheduled for a virtual visit with a [facility] provider today. Just as with appointments in the office, your consent must be obtained to participate. Your consent will be active for this visit and any virtual visit you may have with one of our providers in the next 365 days. If you have a MyChart account, a copy of this consent can be sent to you electronically.  As this is a virtual visit, video technology does not allow for your provider to perform a traditional examination. This may limit your provider's ability to fully assess your condition. If your provider identifies any concerns that need to be evaluated in person or the need to arrange testing (such as labs, EKG, etc.), we will make arrangements to do so. Although advances in technology are sophisticated, we cannot ensure that it will always work on either your end or our end. If the connection with a video visit is poor, the visit may have to be switched to a telephone visit. With either a video or telephone visit, we are not always able to ensure that we have a secure connection.  By engaging in this virtual visit, you consent to the provision of healthcare and authorize for your insurance to be billed (if applicable) for the services provided during this visit. Depending on your insurance coverage, you may receive a charge related to this service.  I need to obtain your verbal consent now. Are you willing to proceed with your visit today? Samantha Blackburn has provided verbal consent on 01/20/2022 for a virtual visit (video or telephone). Dellia Nims, FNP  Date: 01/20/2022 9:31 AM  Virtual Visit via Video Note   I, Dellia Nims, connected with  Samantha Blackburn  ([id number], 1980/09/13) on 01/20/22 at  9:30 AM EST by a video-enabled telemedicine application and verified that I am speaking with the correct person using two identifiers.  Location: Patient: Virtual Visit Location Patient:  Other: work Provider: Ecologist: Home Office   I discussed the limitations of evaluation and management by telemedicine and the availability of in person appointments. The patient expressed understanding and agreed to proceed.    History of Present Illness: Samantha Blackburn is a 42 y.o. who identifies as a female who was assigned female at birth, and is being seen today for positive covid testing at work. She started with sx yesterday after her husband had been sick all week. They tested her at work this am and she was positive for covid. She is leaving. She is diabetic.Reports glucose remains under 200. She has fever, chills, body aches, cough and headache. She requests antiviral.   HPI: HPI  Problems:  Patient Active Problem List   Diagnosis Date Noted   Encounter for annual general medical examination with abnormal findings in adult 11/19/2021   Hyperlipidemia LDL goal <70 05/10/2021   Neuropathy 05/10/2021   Need for Tdap vaccination 05/10/2021   Hyponatremia 05/10/2021   Rhinosinusitis 04/05/2021   Common cold 12/06/2020   Renal abscess 10/12/2020   Abnormal physical evaluation 05/26/2020   Type 2 diabetes mellitus with hyperglycemia ([place]) 05/09/2020   Hypertension associated with diabetes ([place]) 05/09/2020   History of trichomoniasis 04/19/2016   Encounter to establish care 11/14/2015   Obesity 12/01/2012    Allergies:  Allergies  Allergen Reactions   Cymbalta [Duloxetine Hcl]     Can't take it   Morphine    Medications:  Current Outpatient Medications:  benzonatate (TESSALON) 200 MG capsule, Take 1 capsule (200 mg total) by mouth 2 (two) times daily as needed for cough., Disp: 20 capsule, Rfl: 0   molnupiravir EUA (LAGEVRIO) 200 mg CAPS capsule, Take 4 capsules (800 mg total) by mouth 2 (two) times daily for 5 days., Disp: 40 capsule, Rfl: 0   predniSONE (DELTASONE) 20 MG tablet, Take 1 tablet (20 mg total) by mouth 2 (two) times daily with a meal  for 5 days., Disp: 10 tablet, Rfl: 0   atorvastatin (LIPITOR) 40 MG tablet, Take 1 tablet (40 mg total) by mouth daily., Disp: 90 tablet, Rfl: 3   blood glucose meter kit and supplies, Dispense based on patient and insurance preference. Use up to four times daily as directed. (FOR ICD-10 E10.9, E11.9)., Disp: 1 each, Rfl: 11   cetirizine (ZYRTEC) 10 MG tablet, Take 1 tablet (10 mg total) by mouth daily., Disp: 30 tablet, Rfl: 11   gabapentin (NEURONTIN) 800 MG tablet, TAKE ONE (1) TABLET BY MOUTH 3 TIMES DAILY, Disp: 90 tablet, Rfl: 2   ibuprofen (ADVIL) 800 MG tablet, Take 800 mg by mouth 3 (three) times daily. (Patient not taking: Reported on 10/20/2021), Disp: , Rfl:    insulin aspart (NOVOLOG) 100 UNIT/ML FlexPen, Sliding scale TID with meals. Administer 7 units for blood sugar 70-139, 9 units for blood sugar 140-180, 11 units for blood sugar 181-240, 13 units for blood sugar 241-300, 15 units for blood sugar 301-350,  17 units for bloods sugar 351-400,  and call MD and administer 19 units if blood sugar > 400., Disp: 45 mL, Rfl: 1   insulin degludec (TRESIBA FLEXTOUCH) 100 UNIT/ML FlexTouch Pen, Inject 24 to 50 units into the skin at bedtime., Disp: 15 mL, Rfl: 2   Insulin Pen Needle (PEN NEEDLES) 33G X 4 MM MISC, 1 each by Does not apply route 4 (four) times daily - after meals and at bedtime., Disp: 300 each, Rfl: 3   lidocaine (XYLOCAINE) 2 % solution, Use as directed 5 mLs in the mouth or throat every 6 (six) hours as needed for mouth pain., Disp: 100 mL, Rfl: 0   norgestimate-ethinyl estradiol (ORTHO-CYCLEN) 0.25-35 MG-MCG tablet, Take 1 tablet by mouth daily., Disp: , Rfl:    olmesartan (BENICAR) 40 MG tablet, Take 1 tablet (40 mg total) by mouth daily., Disp: 90 tablet, Rfl: 1   SLYND 4 MG TABS, Take 1 tablet by mouth daily., Disp: , Rfl:    sodium chloride (OCEAN) 0.65 % SOLN nasal spray, Place 1 spray into both nostrils as needed for congestion., Disp: 15 mL, Rfl:  0  Observations/Objective: Patient is well-developed, well-nourished in no acute distress.  Sitting at work.  No labored breathing.  Speech is clear and coherent with logical content.  Patient is alert and oriented at baseline.    Assessment and Plan: 1. COVID  Increase fluids, ibuprofen as directed, humidifier at night, MVI with Vit d and zinc, urgent care if sx worsen. OOW --quarantine discussed.   Follow Up Instructions: I discussed the assessment and treatment plan with the patient. The patient was provided an opportunity to ask questions and all were answered. The patient agreed with the plan and demonstrated an understanding of the instructions.  A copy of instructions were sent to the patient via MyChart unless otherwise noted below.     The patient was advised to call back or seek an in-person evaluation if the symptoms worsen or if the condition fails to improve as anticipated.  Time:  I spent 10 minutes with the patient via telehealth technology discussing the above problems/concerns.    Dellia Nims, FNP

## 2022-01-20 NOTE — Patient Instructions (Signed)
Quarantine and Isolation Quarantine and isolation refer to local and travel restrictions to protect the public and travelers from contagious diseases that constitute a public health threat. Contagious diseases are diseases that can spread from one person to another. Quarantine and isolation help to protect the public by preventing exposure to people who have or may have a contagious disease. Isolation separates people who are sick with a contagious disease from people who are not sick. Quarantine separates and restricts the movement of people who were exposed to a contagious disease to see if they become sick. You may be put in quarantine or isolation if you have been exposed to or diagnosed with any of the following diseases: Severe acute respiratory syndromes, such as COVID-19. Cholera. Diphtheria. Tuberculosis. Plague. Smallpox. Yellow fever. Viral hemorrhagic fevers, such as [place], Ebola, and [place]. When to quarantine or isolate Follow these rules, whether you have been vaccinated or not: Stay home and isolate from others when you are sick with a contagious disease. Isolate when you test positive for a contagious disease, even if you do not have symptoms. Isolate if you are sick and suspect that you may have a contagious disease. If you suspect that you have a contagious disease, get tested. If your test results are negative, you can end your isolation. If your test results are positive, follow the full isolation recommendations as told by your health care provider or local health authorities. Quarantine and stay away from others when you have been in close contact with someone who has tested positive for a contagious disease. Close contact is defined as being less than 6 ft (1.8 m) away from an infected person for a total of 15 minutes or more over a 24-hour period. Do not go to places where you are unable to wear a mask, such as restaurants and some gyms. Stay home and separate  from others as much as possible. Avoid being around people who may get very sick from the contagious disease that you have. Use a separate bathroom, if possible. Do not travel. For travel guidance, visit the CDC's travel webpage at wwwnc.cdc.gov/travel/ Follow these instructions at home: Medicines  Take over-the-counter and prescription medicines as told by your health care provider. Finish all antibiotic medicine even when you start to feel better. Stay up to date with all your vaccines. Get scheduled vaccines and boosters as recommended by your health care provider. Lifestyle Wear a high-quality mask if you must be around others at home and in public, if recommended. Improve air flow (ventilation) at home to help prevent the disease from spreading to other people, if possible. Do not share personal household items, like cups, towels, and utensils. Practice everyday hygiene and cleaning. General instructions Talk to your health care provider if you have a weakened body defense system (immune system). People with a weakened immune system may have a reduced immune response to vaccines. You may need to follow current prevention measures, including wearing a well-fitting mask, avoiding crowds, and avoiding poorly ventilated indoor places. Monitor symptoms and follow health care provider instructions, which may include resting, drinking fluids, and taking medicines. Follow specific isolation and quarantine recommendations if you are in places that can lead to disease outbreaks, such as correctional and detention facilities, homeless shelters, and cruise ships. Return to your normal activities as told by your health care provider. Ask your health care provider what activities are safe for you. Keep all follow-up visits. This is important. Where to find more information CDC: www.cdc.gov/quarantine/index.html Contact   a health care provider if: You have a fever. You have signs and symptoms that  return or get worse after isolation. Get help right away if: You have difficulty breathing. You have chest pain. These symptoms may be an emergency. Get help right away. Call 911. Do not wait to see if the symptoms will go away. Do not drive yourself to the hospital. Summary Isolation and quarantine help protect the public by preventing exposure to people who have or may have a contagious disease. Isolate when you are sick or when you test positive, even if you do not have symptoms. Quarantine and stay away from others when you have been in close contact with someone who has tested positive for a contagious disease. This information is not intended to replace advice given to you by your health care provider. Make sure you discuss any questions you have with your health care provider. Document Revised: 01/12/2021 Document Reviewed: 12/22/2020 Elsevier Patient Education  [place] VFIEP-32, or coronavirus disease 2019, is an infection that is caused by a new (novel) coronavirus called SARS-CoV-2. COVID-19 can cause many symptoms. In some people, the virus may not cause any symptoms. In others, it may cause mild or severe symptoms. Some people with severe infection develop severe disease. What are the causes? This illness is caused by a virus. The virus may be in the air as tiny specks of fluid (aerosols) or droplets, or it may be on surfaces. You may catch the virus by: Breathing in droplets from an infected person. Droplets can be spread by a person breathing, speaking, singing, coughing, or sneezing. Touching something, like a table or a doorknob, that has virus on it (is contaminated) and then touching your mouth, nose, or eyes. What increases the risk? Risk for infection: You are more likely to get infected with the COVID-19 virus if: You are within 6 ft (1.8 m) of a person with COVID-19 for 15 minutes or longer. You are providing care for a person who is infected with  COVID-19. You are in close personal contact with other people. Close personal contact includes hugging, kissing, or sharing eating or drinking utensils. Risk for serious illness caused by COVID-19: You are more likely to get seriously ill from the COVID-19 virus if: You have cancer. You have a long-term (chronic) disease, such as: Chronic lung disease. This includes pulmonary embolism, chronic obstructive pulmonary disease, and cystic fibrosis. Long-term disease that lowers your body's ability to fight infection (immunocompromise). Serious cardiac conditions, such as heart failure, coronary artery disease, or cardiomyopathy. Diabetes. Chronic kidney disease. Liver diseases. These include cirrhosis, nonalcoholic fatty liver disease, alcoholic liver disease, or autoimmune hepatitis. You have obesity. You are pregnant or were recently pregnant. You have sickle cell disease. What are the signs or symptoms? Symptoms of this condition can range from mild to severe. Symptoms may appear any time from 2 to 14 days after being exposed to the virus. They include: Fever or chills. Shortness of breath or trouble breathing. Feeling tired or very tired. Headaches, body aches, or muscle aches. Runny or stuffy nose, sneezing, coughing, or sore throat. New loss of taste or smell. This is rare. Some people may also have stomach problems, such as nausea, vomiting, or diarrhea. Other people may not have any symptoms of COVID-19. How is this diagnosed? This condition may be diagnosed by testing samples to check for the COVID-19 virus. The most common tests are the PCR test and the antigen test. Tests may be done  in the lab or at home. They include: Using a swab to take a sample of fluid from the back of your nose and throat (nasopharyngeal fluid), from your nose, or from your throat. Testing a sample of saliva from your mouth. Testing a sample of coughed-up mucus from your lungs (sputum). How is this  treated? Treatment for COVID-19 infection depends on the severity of the condition. Mild symptoms can be managed at home with rest, fluids, and over-the-counter medicines. Serious symptoms may be treated in a hospital intensive care unit (ICU). Treatment in the ICU may include: Supplemental oxygen. Extra oxygen is given through a tube in the nose, a face mask, or a hood. Medicines. These may include: Antivirals, such as monoclonal antibodies. These help your body fight off certain viruses that can cause disease. Anti-inflammatories, such as corticosteroids. These reduce inflammation and suppress the immune system. Antithrombotics. These prevent or treat blood clots, if they develop. Convalescent plasma. This helps boost your immune system, if you have an underlying immunosuppressive condition or are getting immunosuppressive treatments. Prone positioning. This means you will lie on your stomach. This helps oxygen to get into your lungs. Infection control measures. If you are at risk for more serious illness caused by COVID-19, your health care provider may prescribe two long-acting monoclonal antibodies, given together every 6 months. How is this prevented? To protect yourself: Use preventive medicine (pre-exposure prophylaxis). You may get pre-exposure prophylaxis if you have moderate or severe immunocompromise. Get vaccinated. Anyone 36 months old or older who meets guidelines can get a COVID-19 vaccine or vaccine series. This includes people who are pregnant or making breast milk (lactating). Get an added dose of COVID-19 vaccine after your first vaccine or vaccine series if you have moderate to severe immunocompromise. This applies if you have had a solid organ transplant or have been diagnosed with an immunocompromising condition. You should get the added dose 4 weeks after you got the first COVID-19 vaccine or vaccine series. If you get an mRNA vaccine, you will need a 3-dose primary  series. If you get the J&J/Janssen vaccine, you will need a 2-dose primary series, with the second dose being an mRNA vaccine. Talk to your health care provider about getting experimental monoclonal antibodies. This treatment is approved under emergency use authorization to prevent severe illness before or after being exposed to the COVID-19 virus. You may be given monoclonal antibodies if: You have moderate or severe immunocompromise. This includes treatments that lower your immune response. People with immunocompromise may not develop protection against COVID-19 when they are vaccinated. You cannot be vaccinated. You may not get a vaccine if you have a severe allergic reaction to the vaccine or its components. You are not fully vaccinated. You are in a facility where COVID-19 is present and: Are in close contact with a person who is infected with the COVID-19 virus. Are at high risk of being exposed to the COVID-19 virus. You are at risk of illness from new variants of the COVID-19 virus. To protect others: If you have symptoms of COVID-19, take steps to prevent the virus from spreading to others. Stay home. Leave your house only to get medical care. Do not use public transit, if possible. Do not travel while you are sick. Wash your hands often with soap and water for at least 20 seconds. If soap and water are not available, use alcohol-based hand sanitizer. Make sure that all people in your household wash their hands well and often. Cough or sneeze  into a tissue or your sleeve or elbow. Do not cough or sneeze into your hand or into the air. Where to find more information Centers for Disease Control and Prevention: [employer].be World Health Organization: https://www.castaneda.info/ Get help right away if: You have trouble breathing. You have pain or pressure in your chest. You are confused. You have bluish lips and fingernails. You have trouble waking from sleep. You  have symptoms that get worse. These symptoms may be an emergency. Get help right away. Call 911. Do not wait to see if the symptoms will go away. Do not drive yourself to the hospital. Summary COVID-19 is an infection that is caused by a new coronavirus. Sometimes, there are no symptoms. Other times, symptoms range from mild to severe. Some people with a severe COVID-19 infection develop severe disease. The virus that causes COVID-19 can spread from person to person through droplets or aerosols from breathing, speaking, singing, coughing, or sneezing. Mild symptoms of COVID-19 can be managed at home with rest, fluids, and over-the-counter medicines. This information is not intended to replace advice given to you by your health care provider. Make sure you discuss any questions you have with your health care provider. Document Revised: 12/20/2020 Document Reviewed: 12/22/2020 Elsevier Patient Education  [place].

## 2022-01-22 ENCOUNTER — Other Ambulatory Visit: Payer: Self-pay

## 2022-01-22 ENCOUNTER — Telehealth: Payer: Self-pay | Admitting: Internal Medicine

## 2022-01-22 MED ORDER — MOLNUPIRAVIR EUA 200MG CAPSULE: 4.0000 | ORAL_CAPSULE | Freq: Two times a day (BID) | ORAL | 0 refills | Status: AC

## 2022-01-22 NOTE — Telephone Encounter (Signed)
Pt called stating that she was seen on 01/20/22 by [facility]. States the meds they sent to her phar can't be filled there it is needing to be transferred to another pharmacy. Pt has called both numbers to that office and keeps getting transferred. She wants to know if we are able to call in these medications to another pharmacy?   [facility] mentioned a [facility]?    She is COVID +.    Can nurse please contact her?

## 2022-02-20 ENCOUNTER — Encounter: Payer: Self-pay | Admitting: Internal Medicine

## 2022-02-20 ENCOUNTER — Ambulatory Visit (INDEPENDENT_AMBULATORY_CARE_PROVIDER_SITE_OTHER): Payer: 59 | Admitting: Internal Medicine

## 2022-02-20 VITALS — BP 161/93 | HR 102 | Ht 63.0 in | Wt 146.4 lb

## 2022-02-20 DIAGNOSIS — E1165 Type 2 diabetes mellitus with hyperglycemia: Secondary | ICD-10-CM

## 2022-02-20 DIAGNOSIS — I152 Hypertension secondary to endocrine disorders: Secondary | ICD-10-CM

## 2022-02-20 DIAGNOSIS — G629 Polyneuropathy, unspecified: Secondary | ICD-10-CM | POA: Diagnosis not present

## 2022-02-20 DIAGNOSIS — E1159 Type 2 diabetes mellitus with other circulatory complications: Secondary | ICD-10-CM

## 2022-02-20 DIAGNOSIS — F419 Anxiety disorder, unspecified: Secondary | ICD-10-CM

## 2022-02-20 DIAGNOSIS — Z794 Long term (current) use of insulin: Secondary | ICD-10-CM

## 2022-02-20 MED ORDER — AMLODIPINE BESYLATE 5 MG PO TABS: 5.0000 mg | ORAL_TABLET | Freq: Every day | ORAL | 2 refills | Status: DC

## 2022-02-20 MED ORDER — GABAPENTIN 600 MG PO TABS: 1200.0000 mg | ORAL_TABLET | Freq: Three times a day (TID) | ORAL | 2 refills | Status: DC

## 2022-02-20 MED ORDER — HYDROXYZINE PAMOATE 25 MG PO CAPS: 25.0000 mg | ORAL_CAPSULE | Freq: Three times a day (TID) | ORAL | 0 refills | Status: DC | PRN

## 2022-02-20 MED ORDER — TIRZEPATIDE 2.5 MG/0.5ML ~~LOC~~ SOAJ: 2.5000 mg | SUBCUTANEOUS | 0 refills | Status: DC

## 2022-02-20 MED ORDER — METFORMIN HCL ER 500 MG PO TB24: ORAL_TABLET | ORAL | 1 refills | Status: DC

## 2022-02-20 NOTE — Progress Notes (Signed)
Established Patient Office Visit  Subjective   Patient ID: Samantha Blackburn, female    DOB: 17-Dec-1980  Age: 42 y.o. MRN: [id number]  Chief Complaint  Patient presents with   Medication Refill   Samantha Blackburn returns to care today.  She was last seen by me on 11/13/21 for her annual exam.  In the interim she presented to urgent care on 11/30 and was found to have strep throat.  There have otherwise been no acute interval events.  Samantha Blackburn reports feeling well today.  She endorses persistent neuropathic pain, mostly in her feet.  She has no acute concerns to discuss today.  Past Medical History:  Diagnosis Date   Diabetes mellitus without complication ([place])    Diabetes mellitus, new onset ([place]) 11/29/2012   Obesity 12/01/2012   Trichimoniasis    Past Surgical History:  Procedure Laterality Date   CHOLECYSTECTOMY     Social History   Tobacco Use   Smoking status: Former    Packs/day: 1.00    Years: 10.00    Total pack years: 10.00    Types: Cigarettes    Quit date: 05/10/2010    Years since quitting: 11.8   Smokeless tobacco: Never  Vaping Use   Vaping Use: Never used  Substance Use Topics   Alcohol use: No   Drug use: No   Family History  Problem Relation Age of Onset   Hypertension Mother    Diabetes Mother    Cancer Father        lymphoma, kidney,bladder, liver,prostate   Diabetes Father    Heart attack Father    Diabetes Maternal Grandmother    Heart attack Maternal Grandfather    Stroke Paternal Grandmother    Allergies  Allergen Reactions   Cymbalta [Duloxetine Hcl]     Can't take it   Morphine    Review of Systems  Constitutional:  Negative for chills and fever.  HENT:  Negative for sore throat.   Respiratory:  Negative for cough and shortness of breath.   Cardiovascular:  Negative for chest pain, palpitations and leg swelling.  Gastrointestinal:  Negative for abdominal pain, blood in stool, constipation, diarrhea, nausea and vomiting.   Genitourinary:  Negative for dysuria and hematuria.  Musculoskeletal:  Negative for myalgias.  Skin:  Negative for itching and rash.  Neurological:  Positive for tingling. Negative for dizziness and headaches.       Neuropathic pain in feet bilaterally  Psychiatric/Behavioral:  Negative for depression and suicidal ideas. The patient is nervous/anxious.      Objective:     BP (!) 161/93   Pulse (!) 102   Ht 5' 3"$  (1.6 m)   Wt 146 lb 6.4 oz (66.4 kg)   SpO2 97%   BMI 25.93 kg/m  BP Readings from Last 3 Encounters:  02/20/22 (!) 161/93  12/14/21 124/84  11/13/21 136/85   Physical Exam Vitals reviewed.  Constitutional:      General: She is not in acute distress.    Appearance: Normal appearance. She is not toxic-appearing.  HENT:     Head: Normocephalic and atraumatic.     Right Ear: External ear normal.     Left Ear: External ear normal.     Nose: Nose normal. No congestion or rhinorrhea.     Mouth/Throat:     Mouth: Mucous membranes are moist.     Pharynx: Oropharynx is clear. No oropharyngeal exudate or posterior oropharyngeal erythema.  Eyes:     General: No  scleral icterus.    Extraocular Movements: Extraocular movements intact.     Conjunctiva/sclera: Conjunctivae normal.     Pupils: Pupils are equal, round, and reactive to light.  Cardiovascular:     Rate and Rhythm: Normal rate and regular rhythm.     Pulses: Normal pulses.     Heart sounds: Normal heart sounds. No murmur heard.    No friction rub. No gallop.  Pulmonary:     Effort: Pulmonary effort is normal.     Breath sounds: Normal breath sounds. No wheezing, rhonchi or rales.  Abdominal:     General: Abdomen is flat. Bowel sounds are normal. There is no distension.     Palpations: Abdomen is soft.     Tenderness: There is no abdominal tenderness.  Musculoskeletal:        General: No swelling. Normal range of motion.     Cervical back: Normal range of motion.     Right lower leg: No edema.     Left  lower leg: No edema.  Lymphadenopathy:     Cervical: No cervical adenopathy.  Skin:    General: Skin is warm and dry.     Capillary Refill: Capillary refill takes less than 2 seconds.     Coloration: Skin is not jaundiced.  Neurological:     General: No focal deficit present.     Mental Status: She is alert and oriented to person, place, and time.  Psychiatric:        Mood and Affect: Mood normal.        Behavior: Behavior normal.   Last CBC Lab Results  Component Value Date   WBC 8.4 05/08/2021   HGB 14.8 05/08/2021   HCT 43.2 05/08/2021   MCV 92 05/08/2021   MCH 31.5 05/08/2021   RDW 12.4 05/08/2021   PLT 248 99991111   Last metabolic panel Lab Results  Component Value Date   GLUCOSE 424 (H) 11/13/2021   NA 136 11/13/2021   K 4.5 11/13/2021   CL 100 11/13/2021   CO2 21 11/13/2021   BUN 11 11/13/2021   CREATININE 0.64 11/13/2021   EGFR 114 11/13/2021   CALCIUM 9.8 11/13/2021   PROT 6.6 11/13/2021   ALBUMIN 3.9 11/13/2021   LABGLOB 2.7 11/13/2021   AGRATIO 1.4 11/13/2021   BILITOT 0.4 11/13/2021   ALKPHOS 79 11/13/2021   AST 11 11/13/2021   ALT 9 11/13/2021   ANIONGAP 13 08/21/2019   Last lipids Lab Results  Component Value Date   CHOL 168 07/07/2021   HDL 55 07/07/2021   LDLCALC 102 (H) 07/07/2021   TRIG 56 07/07/2021   CHOLHDL 3.1 07/07/2021   Last hemoglobin A1c Lab Results  Component Value Date   HGBA1C 11.4 (H) 11/13/2021   Last thyroid functions Lab Results  Component Value Date   TSH 3.112 11/29/2012   The 10-year ASCVD risk score (Arnett DK, et al., 2019) is: 1.7%    Assessment & Plan:   Problem List Items Addressed This Visit       Hypertension associated with diabetes ([place])    She is currently prescribed olmesartan 40 mg daily.  Her blood pressure is elevated today, 146/89 initially and 161/93 on repeat -Add amlodipine 5 mg daily -Follow-up in 4 weeks for HTN check      Type 2 diabetes mellitus with hyperglycemia ([facility])     Last A1c 11.4.  She is currently prescribed Tresiba 35 units nightly and NovoLog sliding scale with meals.  She was previously  prescribed metformin, but it appears that this was inadvertently discontinued. -Resume metformin XR, titrating up to 1000 mg twice daily over 4 weeks -Start Mounjaro 2.5 mg weekly today -Needs ophthalmology follow-up for diabetic eye exam -Follow-up in 4 weeks      Neuropathy    She continues to endorse worsening neuropathic pain, mostly located in her feet.  Gabapentin was increased to 800 mg 3 times daily at her last appointment.  There has been no significant improvement in her pain. -Increase gabapentin to 1200 mg 3 times daily -Follow-up in 4 weeks for reassessment      Anxiety - Primary    Today she endorses anxiety related to stress at home and work.  She is interested in an as needed medication for anxiety relief. -Hydroxyzine 25 mg as needed has been prescribed       Return in about 4 weeks (around 03/20/2022) for HTN, DM, anxiety, repeat lipid panel.    Johnette Abraham, MD

## 2022-02-20 NOTE — Patient Instructions (Signed)
It was a pleasure to see you today.  Thank you for giving [place] the opportunity to be involved in your care.  Below is a brief recap of your visit and next steps.  We will plan to see you again in 4 weeks.  Summary Start metformin and Mounjaro for diabetes Start amlodipine 5 mg daily for hypertension Increase gabapentin to 1200 mg three times daily Start hydroxyzine 25 mg as needed for anxiety Follow up in 4 weeks

## 2022-02-23 LAB — HM DIABETES EYE EXAM

## 2022-02-26 DIAGNOSIS — F419 Anxiety disorder, unspecified: Secondary | ICD-10-CM | POA: Insufficient documentation

## 2022-02-26 NOTE — Assessment & Plan Note (Signed)
Last A1c 11.4.  She is currently prescribed Tresiba 35 units nightly and NovoLog sliding scale with meals.  She was previously prescribed metformin, but it appears that this was inadvertently discontinued. -Resume metformin XR, titrating up to 1000 mg twice daily over 4 weeks -Start Mounjaro 2.5 mg weekly today -Needs ophthalmology follow-up for diabetic eye exam -Follow-up in 4 weeks

## 2022-02-26 NOTE — Assessment & Plan Note (Signed)
Today she endorses anxiety related to stress at home and work.  She is interested in an as needed medication for anxiety relief. -Hydroxyzine 25 mg as needed has been prescribed

## 2022-02-26 NOTE — Assessment & Plan Note (Signed)
She continues to endorse worsening neuropathic pain, mostly located in her feet.  Gabapentin was increased to 800 mg 3 times daily at her last appointment.  There has been no significant improvement in her pain. -Increase gabapentin to 1200 mg 3 times daily -Follow-up in 4 weeks for reassessment

## 2022-02-26 NOTE — Assessment & Plan Note (Signed)
She is currently prescribed olmesartan 40 mg daily.  Her blood pressure is elevated today, 146/89 initially and 161/93 on repeat -Add amlodipine 5 mg daily -Follow-up in 4 weeks for HTN check

## 2022-03-20 ENCOUNTER — Ambulatory Visit (INDEPENDENT_AMBULATORY_CARE_PROVIDER_SITE_OTHER): Payer: 59 | Admitting: Internal Medicine

## 2022-03-20 ENCOUNTER — Encounter: Payer: Self-pay | Admitting: Internal Medicine

## 2022-03-20 VITALS — BP 138/88 | HR 108 | Ht 63.0 in | Wt 143.4 lb

## 2022-03-20 DIAGNOSIS — E1159 Type 2 diabetes mellitus with other circulatory complications: Secondary | ICD-10-CM

## 2022-03-20 DIAGNOSIS — G629 Polyneuropathy, unspecified: Secondary | ICD-10-CM | POA: Diagnosis not present

## 2022-03-20 DIAGNOSIS — I152 Hypertension secondary to endocrine disorders: Secondary | ICD-10-CM

## 2022-03-20 DIAGNOSIS — Z794 Long term (current) use of insulin: Secondary | ICD-10-CM

## 2022-03-20 DIAGNOSIS — F419 Anxiety disorder, unspecified: Secondary | ICD-10-CM

## 2022-03-20 DIAGNOSIS — E1165 Type 2 diabetes mellitus with hyperglycemia: Secondary | ICD-10-CM

## 2022-03-20 MED ORDER — OLMESARTAN MEDOXOMIL 40 MG PO TABS: 40.0000 mg | ORAL_TABLET | Freq: Every day | ORAL | 1 refills | Status: DC

## 2022-03-20 MED ORDER — HYDROXYZINE PAMOATE 25 MG PO CAPS: 25.0000 mg | ORAL_CAPSULE | Freq: Three times a day (TID) | ORAL | 1 refills | Status: DC | PRN

## 2022-03-20 MED ORDER — GABAPENTIN 600 MG PO TABS: 1200.0000 mg | ORAL_TABLET | Freq: Three times a day (TID) | ORAL | 2 refills | Status: DC

## 2022-03-20 MED ORDER — TIRZEPATIDE 5 MG/0.5ML ~~LOC~~ SOAJ: 5.0000 mg | SUBCUTANEOUS | 1 refills | Status: DC

## 2022-03-20 MED ORDER — METFORMIN HCL ER 500 MG PO TB24: 1000.0000 mg | ORAL_TABLET | Freq: Two times a day (BID) | ORAL | 1 refills | Status: DC

## 2022-03-20 NOTE — Assessment & Plan Note (Signed)
Mounjaro 2.5 mg weekly was added last month and metformin XR was resumed.  She has not experienced any adverse side effects.  She states that her evening blood sugar readings remain elevated but overall seem to be improving. -Increase Mounjaro to 5 mg weekly -Continue metformin XR 1000 mg twice daily -Continue Tresiba 35 units nightly and NovoLog sliding scale with meals -Repeat A1c at follow-up in 3 months

## 2022-03-20 NOTE — Assessment & Plan Note (Signed)
Hydroxyzine 25 mg as needed for anxiety relief has been effective. -No medication changes today

## 2022-03-20 NOTE — Assessment & Plan Note (Addendum)
Amlodipine 5 mg daily was added for improved HTN control at her last appointment.  Her BP today was 140/83 initially and 138/88 on repeat. -No medication changes today.  Continue amlodipine and olmesartan at current doses.

## 2022-03-20 NOTE — Patient Instructions (Signed)
It was a pleasure to see you today.  Thank you for giving [place] the opportunity to be involved in your care.  Below is a brief recap of your visit and next steps.  We will plan to see you again in 3 months.  Summary Increase Mounjaro to 5 mg weekly No other medications changes today We will follow up in 3 months

## 2022-03-20 NOTE — Progress Notes (Signed)
Established Patient Office Visit  Subjective   Patient ID: Samantha Blackburn, female    DOB: June 19, 1980  Age: 42 y.o. MRN: [id number]  Chief Complaint  Patient presents with   Hypertension    Follow up   Diabetes    Follow up   Anxiety    Follow up   Samantha Blackburn returns to care today for HTN, DM, and anxiety follow-up.  She was last evaluated by me on 2/6 at which time amlodipine 5 mg daily was added for improved HTN control.  Mounjaro 2.5 mg weekly was added and metformin XR was resumed for improved management of diabetes.  Gabapentin was also increased to 1200 mg 3 times daily due to persistent neuropathic pain.  Lastly, hydroxyzine 25 mg as needed was added for anxiety relief.  There have been no acute interval events.  Samantha Blackburn reports feeling well today.  She states that hydroxyzine has improved her anxiety significantly and it the increased dose of gabapentin has better control her neuropathic pain.  She has not experienced any adverse side effects with starting Mounjaro or resuming metformin.  She has no additional concerns to discuss today.  Past Medical History:  Diagnosis Date   Diabetes mellitus without complication ([place])    Diabetes mellitus, new onset ([place]) 11/29/2012   Obesity 12/01/2012   Trichimoniasis    Past Surgical History:  Procedure Laterality Date   CHOLECYSTECTOMY     Social History   Tobacco Use   Smoking status: Former    Packs/day: 1.00    Years: 10.00    Total pack years: 10.00    Types: Cigarettes    Quit date: 05/10/2010    Years since quitting: 11.8   Smokeless tobacco: Never  Vaping Use   Vaping Use: Never used  Substance Use Topics   Alcohol use: No   Drug use: No   Family History  Problem Relation Age of Onset   Hypertension Mother    Diabetes Mother    Cancer Father        lymphoma, kidney,bladder, liver,prostate   Diabetes Father    Heart attack Father    Diabetes Maternal Grandmother    Heart attack Maternal Grandfather     Stroke Paternal Grandmother    Allergies  Allergen Reactions   Cymbalta [Duloxetine Hcl]     Can't take it   Morphine    Review of Systems  Constitutional:  Negative for chills and fever.  HENT:  Negative for sore throat.   Respiratory:  Negative for cough and shortness of breath.   Cardiovascular:  Negative for chest pain, palpitations and leg swelling.  Gastrointestinal:  Negative for abdominal pain, blood in stool, constipation, diarrhea, nausea and vomiting.  Genitourinary:  Negative for dysuria and hematuria.  Musculoskeletal:  Negative for myalgias.  Skin:  Negative for itching and rash.  Neurological:  Negative for dizziness and headaches.  Psychiatric/Behavioral:  Negative for depression and suicidal ideas.      Objective:     BP 138/88   Pulse (!) 108   Ht '5\' 3"'$  (1.6 m)   Wt 143 lb 6.4 oz (65 kg)   SpO2 97%   BMI 25.40 kg/m  BP Readings from Last 3 Encounters:  03/20/22 138/88  02/20/22 (!) 161/93  12/14/21 124/84   Physical Exam Vitals reviewed.  Constitutional:      General: She is not in acute distress.    Appearance: Normal appearance. She is not toxic-appearing.  HENT:  Head: Normocephalic and atraumatic.     Right Ear: External ear normal.     Left Ear: External ear normal.     Nose: Nose normal. No congestion or rhinorrhea.     Mouth/Throat:     Mouth: Mucous membranes are moist.     Pharynx: Oropharynx is clear. No oropharyngeal exudate or posterior oropharyngeal erythema.  Eyes:     General: No scleral icterus.    Extraocular Movements: Extraocular movements intact.     Conjunctiva/sclera: Conjunctivae normal.     Pupils: Pupils are equal, round, and reactive to light.  Cardiovascular:     Rate and Rhythm: Normal rate and regular rhythm.     Pulses: Normal pulses.     Heart sounds: Normal heart sounds. No murmur heard.    No friction rub. No gallop.  Pulmonary:     Effort: Pulmonary effort is normal.     Breath sounds: Normal breath  sounds. No wheezing, rhonchi or rales.  Abdominal:     General: Abdomen is flat. Bowel sounds are normal. There is no distension.     Palpations: Abdomen is soft.     Tenderness: There is no abdominal tenderness.  Musculoskeletal:        General: No swelling. Normal range of motion.     Cervical back: Normal range of motion.     Right lower leg: No edema.     Left lower leg: No edema.  Lymphadenopathy:     Cervical: No cervical adenopathy.  Skin:    General: Skin is warm and dry.     Capillary Refill: Capillary refill takes less than 2 seconds.     Coloration: Skin is not jaundiced.  Neurological:     General: No focal deficit present.     Mental Status: She is alert and oriented to person, place, and time.  Psychiatric:        Mood and Affect: Mood normal.        Behavior: Behavior normal.   Last CBC Lab Results  Component Value Date   WBC 8.4 05/08/2021   HGB 14.8 05/08/2021   HCT 43.2 05/08/2021   MCV 92 05/08/2021   MCH 31.5 05/08/2021   RDW 12.4 05/08/2021   PLT 248 99991111   Last metabolic panel Lab Results  Component Value Date   GLUCOSE 424 (H) 11/13/2021   NA 136 11/13/2021   K 4.5 11/13/2021   CL 100 11/13/2021   CO2 21 11/13/2021   BUN 11 11/13/2021   CREATININE 0.64 11/13/2021   EGFR 114 11/13/2021   CALCIUM 9.8 11/13/2021   PROT 6.6 11/13/2021   ALBUMIN 3.9 11/13/2021   LABGLOB 2.7 11/13/2021   AGRATIO 1.4 11/13/2021   BILITOT 0.4 11/13/2021   ALKPHOS 79 11/13/2021   AST 11 11/13/2021   ALT 9 11/13/2021   ANIONGAP 13 08/21/2019   Last lipids Lab Results  Component Value Date   CHOL 168 07/07/2021   HDL 55 07/07/2021   LDLCALC 102 (H) 07/07/2021   TRIG 56 07/07/2021   CHOLHDL 3.1 07/07/2021   Last hemoglobin A1c Lab Results  Component Value Date   HGBA1C 11.4 (H) 11/13/2021   Last thyroid functions Lab Results  Component Value Date   TSH 3.112 11/29/2012   The 10-year ASCVD risk score (Arnett DK, et al., 2019) is: 1.2%     Assessment & Plan:   Problem List Items Addressed This Visit       Hypertension associated with diabetes ([place]) - Primary  Amlodipine 5 mg daily was added for improved HTN control at her last appointment.  Her BP today was 140/83 initially and 138/88 on repeat. -No medication changes today.  Continue amlodipine and olmesartan at current doses.      Type 2 diabetes mellitus with hyperglycemia ([facility])    Mounjaro 2.5 mg weekly was added last month and metformin XR was resumed.  She has not experienced any adverse side effects.  She states that her evening blood sugar readings remain elevated but overall seem to be improving. -Increase Mounjaro to 5 mg weekly -Continue metformin XR 1000 mg twice daily -Continue Tresiba 35 units nightly and NovoLog sliding scale with meals -Repeat A1c at follow-up in 3 months      Neuropathy    Symptoms have improved since increasing gabapentin to 1200 mg 3 times daily. -No medication changes today      Anxiety    Hydroxyzine 25 mg as needed for anxiety relief has been effective. -No medication changes today       Return in about 3 months (around 06/20/2022).    Johnette Abraham, MD

## 2022-03-20 NOTE — Assessment & Plan Note (Signed)
Symptoms have improved since increasing gabapentin to 1200 mg 3 times daily. -No medication changes today

## 2022-06-09 IMAGING — CT CT ABDOMEN WO/W CM
3 of 13 series · 11 of 46 positions shown, 17 images · IV contrast (Omnipaque or Isovue)
Comparison: Multiple priors including most recent CT December 19, 2020

CLINICAL DATA: Follow-up left-sided renal mass found on CT.

EXAM:
CT ABDOMEN WITHOUT AND WITH CONTRAST
TECHNIQUE: Multidetector CT imaging of the abdomen was performed following the
standard protocol before and following the bolus administration of
intravenous contrast.

[Series 2: axial pre · axial · non-contrast · 0.83mm/px · z∈[+1088,+1290]mm · 5 of 101 slices shown, 10 images]
[im 17/101  soft-tissue]
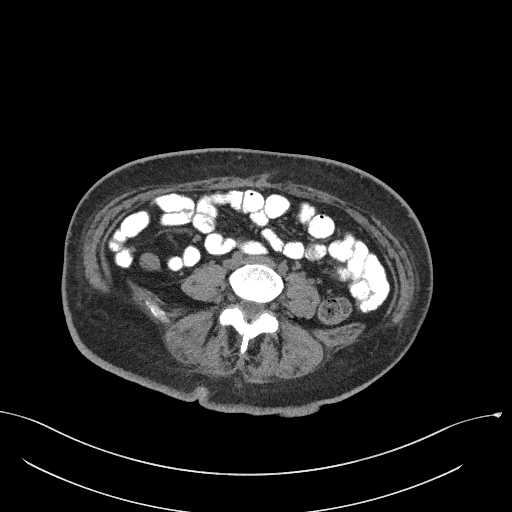
[im 17/101  bone]
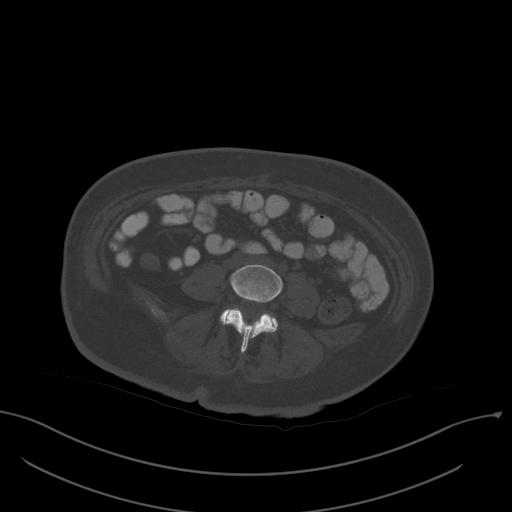
[im 34/101  soft-tissue]
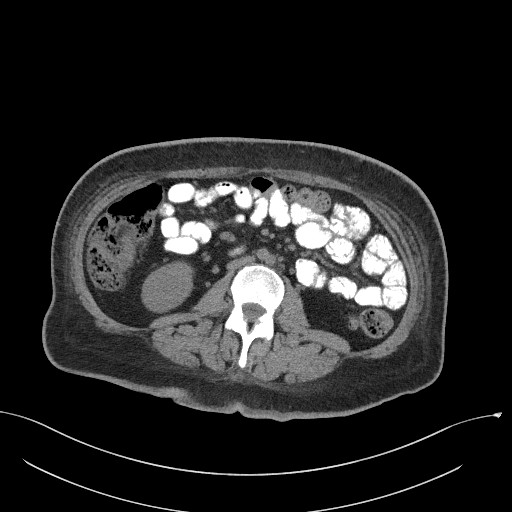
[im 34/101  lung]
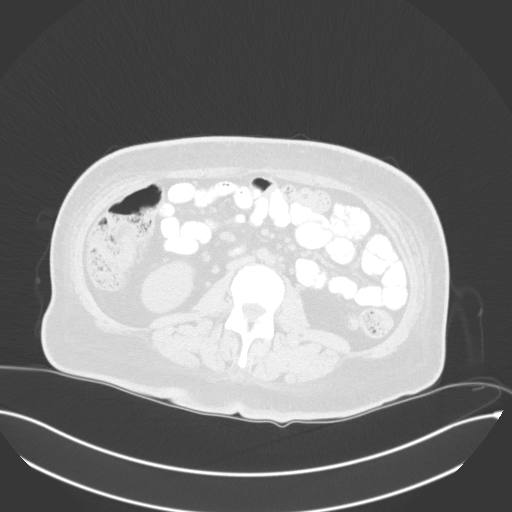
[im 51/101  soft-tissue]
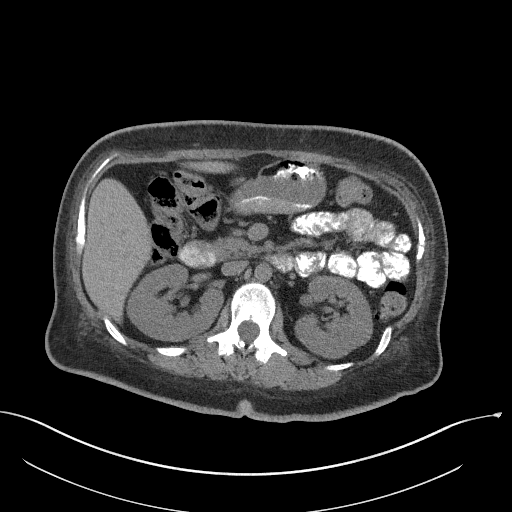
[im 51/101  lung]
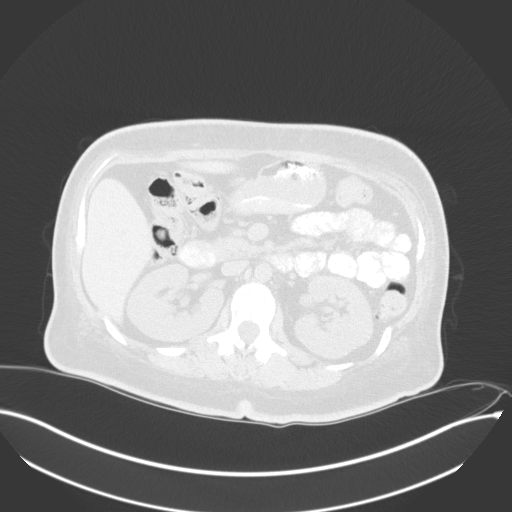
[im 67/101  soft-tissue]
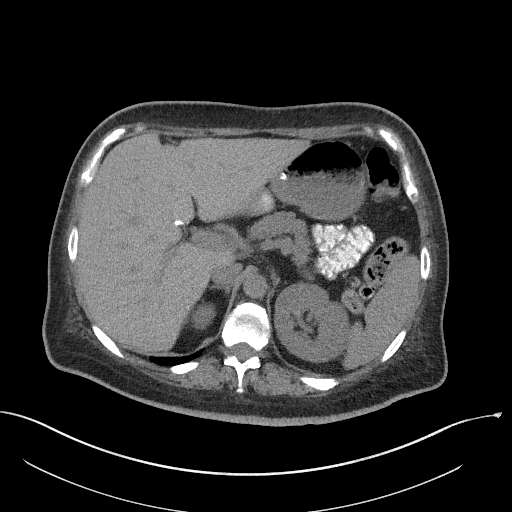
[im 67/101  lung]
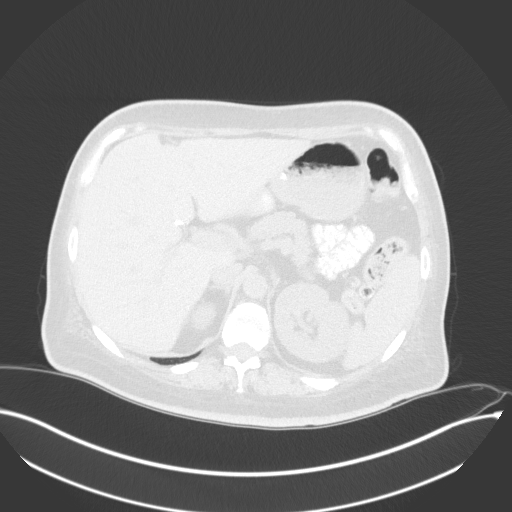
[im 84/101  soft-tissue]
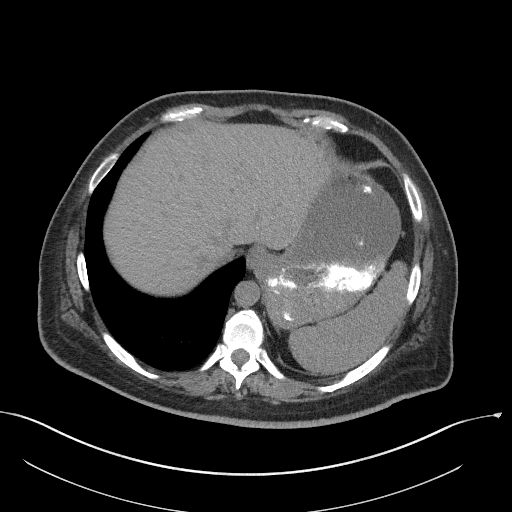
[im 84/101  lung]
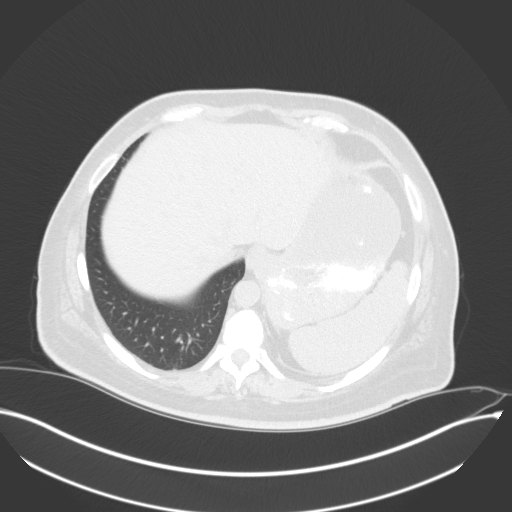

[Series 4: coronal pre · coronal · non-contrast · 0.59mm/px · 2 of 101 slices shown, 3 images]
[im 34/101  soft-tissue]
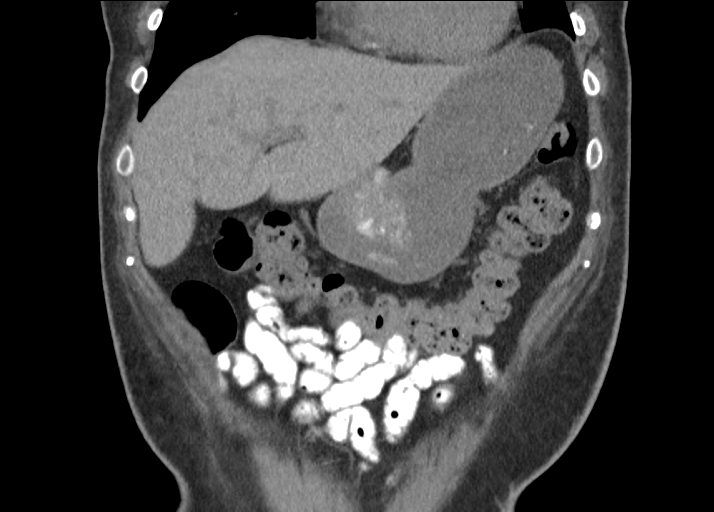
[im 34/101  bone]
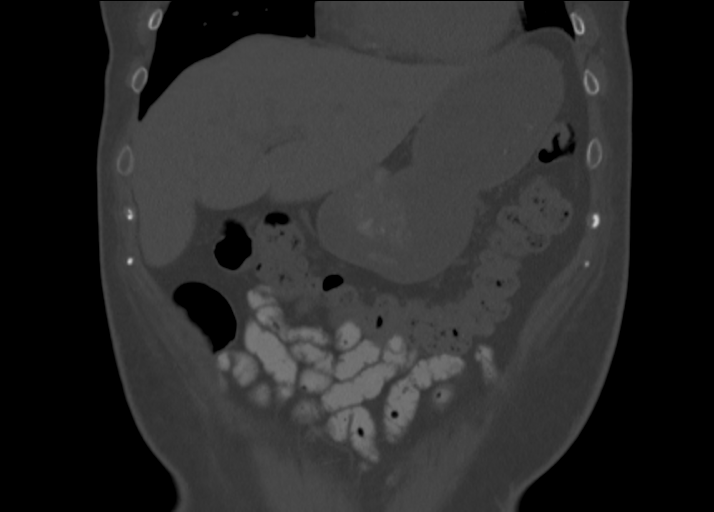
[im 67/101  soft-tissue]
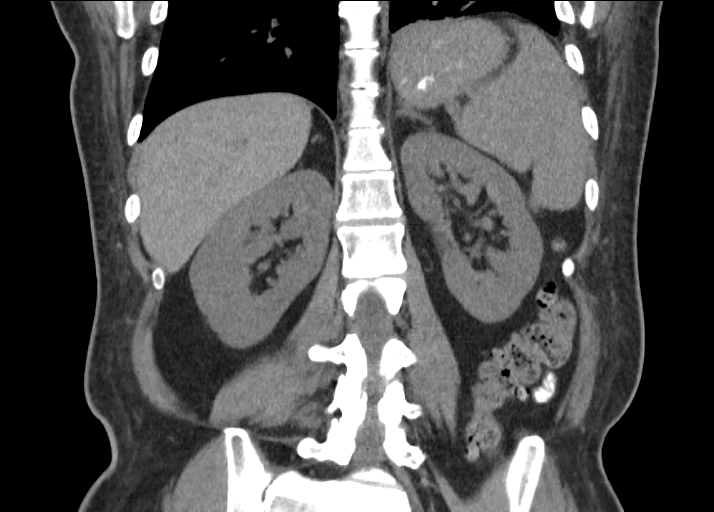

[Series 6: axial arterial · axial · arterial · 0.78mm/px · z∈[+1100,+1280]mm · 4 of 101 slices shown]
[im 21/101  soft-tissue]
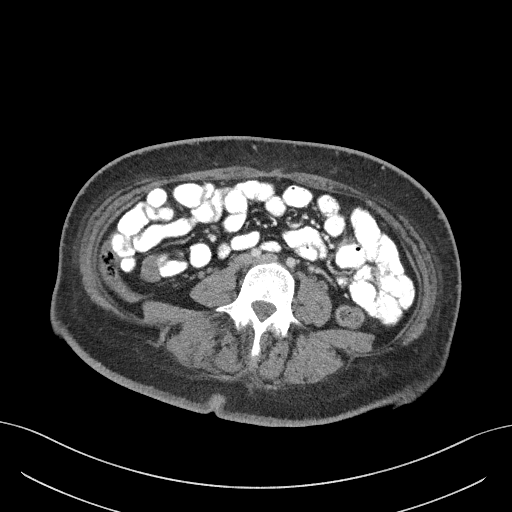
[im 41/101  soft-tissue]
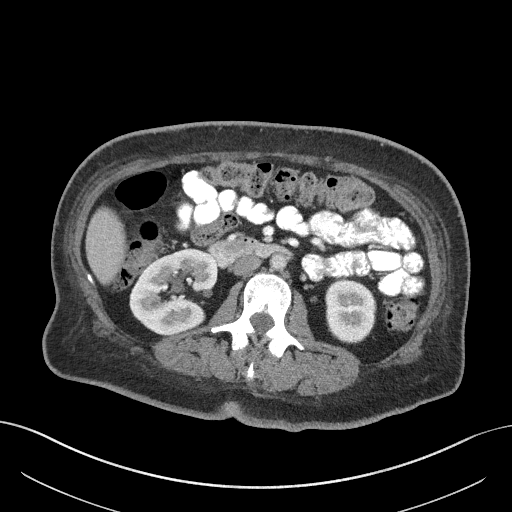
[im 61/101  soft-tissue]
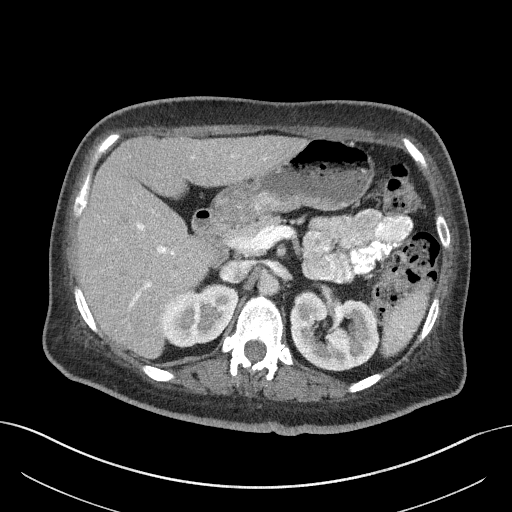
[im 81/101  soft-tissue]
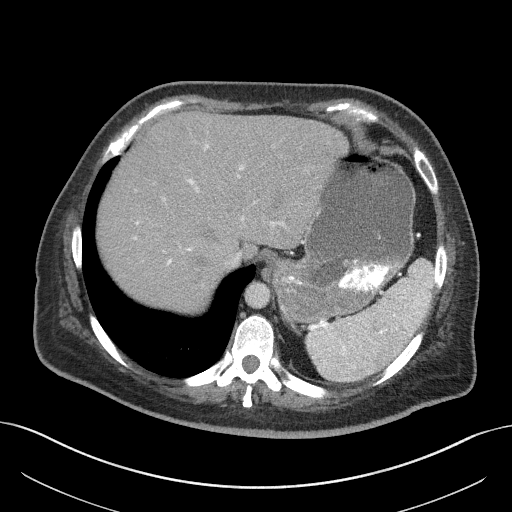

[11 of 46 positions shown; findings below may reference images not displayed]

RADIATION DOSE REDUCTION: This exam was performed according to the
departmental dose-optimization program which includes automated
exposure control, adjustment of the mA and/or kV according to
patient size and/or use of iterative reconstruction technique.

CONTRAST:  80mL OMNIPAQUE IOHEXOL 300 MG/ML  SOLN
FINDINGS: Lower chest: No acute abnormality.

Hepatobiliary: No suspicious hepatic lesion. Gallbladder surgically
absent. No biliary ductal dilation.

Pancreas: No pancreatic ductal dilation or evidence of acute
inflammation.

Spleen: Wedge-shaped hypodense area along the posterior aspect of
the spleen on image [DATE] is not evident on precontrast imaging
enlarged resolves on delayed imaging, stable from prior and
consistent with a benign but indeterminate finding.

Adrenals/Urinary Tract: Bilateral adrenal glands appear normal.

No hydronephrosis.  No nephrolithiasis.

Resolution of the previously seen low attenuating lesion cortical
lesion in the left lower pole which is now too small to accurately
characterize now only demonstrating cortical scarring in this
region. No solid enhancing renal masses.

Kidneys demonstrate symmetric enhancement and excretion of contrast
material.

Stomach/Bowel: Radiopaque enteric contrast material traverses distal
loops of small bowel. Stomach is distended with ingested contents
and gas without abnormal wall thickening identified. No pathologic
dilation or evidence of acute inflammation involving of loops of
large or small bowel in the abdomen. No evidence of acute bowel
inflammation

Vascular/Lymphatic: No significant vascular findings are present. No
enlarged abdominal or pelvic lymph nodes.

Other: No significant abdominopelvic free fluid.

Musculoskeletal: No acute osseous abnormality.
IMPRESSION: Resolution of the previously seen low attenuating cortical lesion in
the left lower pole now demonstrating cortical scarring in this
region, reflecting sequela of prior infection. No solid enhancing
renal masses.

## 2022-06-26 ENCOUNTER — Ambulatory Visit (INDEPENDENT_AMBULATORY_CARE_PROVIDER_SITE_OTHER): Payer: 59 | Admitting: Internal Medicine

## 2022-06-26 ENCOUNTER — Encounter: Payer: Self-pay | Admitting: Internal Medicine

## 2022-06-26 VITALS — BP 139/87 | HR 108 | Ht 63.0 in | Wt 141.0 lb

## 2022-06-26 DIAGNOSIS — G629 Polyneuropathy, unspecified: Secondary | ICD-10-CM | POA: Diagnosis not present

## 2022-06-26 DIAGNOSIS — E1165 Type 2 diabetes mellitus with hyperglycemia: Secondary | ICD-10-CM | POA: Diagnosis not present

## 2022-06-26 DIAGNOSIS — E785 Hyperlipidemia, unspecified: Secondary | ICD-10-CM

## 2022-06-26 DIAGNOSIS — F419 Anxiety disorder, unspecified: Secondary | ICD-10-CM | POA: Diagnosis not present

## 2022-06-26 DIAGNOSIS — Z794 Long term (current) use of insulin: Secondary | ICD-10-CM

## 2022-06-26 LAB — POCT GLYCOSYLATED HEMOGLOBIN (HGB A1C): Hemoglobin A1C: 8.5 % — AB (ref 4.0–5.6)

## 2022-06-26 MED ORDER — TIRZEPATIDE 7.5 MG/0.5ML ~~LOC~~ SOPN
7.5000 mg | PEN_INJECTOR | SUBCUTANEOUS | 0 refills | Status: AC
Start: 2022-06-26 — End: 2022-07-24

## 2022-06-26 MED ORDER — HYDROXYZINE PAMOATE 25 MG PO CAPS: 25.0000 mg | ORAL_CAPSULE | Freq: Three times a day (TID) | ORAL | 2 refills | Status: DC | PRN

## 2022-06-26 NOTE — Patient Instructions (Signed)
It was a pleasure to see you today.  Thank you for giving [place] the opportunity to be involved in your care.  Below is a brief recap of your visit and next steps.  We will plan to see you again in 3 months.  Summary Increase Mounjaro to 7.5 mg weekly Check lipid panel today Follow up in 3 months

## 2022-06-26 NOTE — Progress Notes (Signed)
Established Patient Office Visit  Subjective   Patient ID: Samantha Blackburn, female    DOB: 1980/03/26  Age: 42 y.o. MRN: [id number]  Chief Complaint  Patient presents with   Diabetes    Follow up   Samantha Blackburn returns to care today for routine follow-up.  She was last evaluated by me on 3/5.  Mounjaro was increased to 5 mg weekly at that time.  No additional medication changes were made and 94-month follow-up was arranged.  There have been no acute interval events.  Samantha Blackburn reports feeling well today.  She is asymptomatic and has no acute concerns to discuss.  Past Medical History:  Diagnosis Date   Diabetes mellitus without complication (HCC)    Diabetes mellitus, new onset (HCC) 11/29/2012   Obesity 12/01/2012   Trichimoniasis    Past Surgical History:  Procedure Laterality Date   CHOLECYSTECTOMY     Social History   Tobacco Use   Smoking status: Former    Packs/day: 1.00    Years: 10.00    Additional pack years: 0.00    Total pack years: 10.00    Types: Cigarettes    Quit date: 05/10/2010    Years since quitting: 12.1   Smokeless tobacco: Never  Vaping Use   Vaping Use: Never used  Substance Use Topics   Alcohol use: No   Drug use: No   Family History  Problem Relation Age of Onset   Hypertension Mother    Diabetes Mother    Cancer Father        lymphoma, kidney,bladder, liver,prostate   Diabetes Father    Heart attack Father    Diabetes Maternal Grandmother    Heart attack Maternal Grandfather    Stroke Paternal Grandmother    Allergies  Allergen Reactions   Cymbalta [Duloxetine Hcl]     Can't take it   Morphine    Review of Systems  Constitutional:  Negative for chills and fever.  HENT:  Negative for sore throat.   Respiratory:  Negative for cough and shortness of breath.   Cardiovascular:  Negative for chest pain, palpitations and leg swelling.  Gastrointestinal:  Negative for abdominal pain, blood in stool, constipation, diarrhea, nausea and  vomiting.  Genitourinary:  Negative for dysuria and hematuria.  Musculoskeletal:  Negative for myalgias.  Skin:  Negative for itching and rash.  Neurological:  Negative for dizziness and headaches.  Psychiatric/Behavioral:  Negative for depression and suicidal ideas.      Objective:     BP 139/87   Pulse (!) 108   Ht 5\' 3"  (1.6 m)   Wt 141 lb (64 kg)   SpO2 98%   BMI 24.98 kg/m  BP Readings from Last 3 Encounters:  06/26/22 139/87  03/20/22 138/88  02/20/22 (!) 161/93   Physical Exam Vitals reviewed.  Constitutional:      General: She is not in acute distress.    Appearance: Normal appearance. She is not toxic-appearing.  HENT:     Head: Normocephalic and atraumatic.     Right Ear: External ear normal.     Left Ear: External ear normal.     Nose: Nose normal. No congestion or rhinorrhea.     Mouth/Throat:     Mouth: Mucous membranes are moist.     Pharynx: Oropharynx is clear. No oropharyngeal exudate or posterior oropharyngeal erythema.  Eyes:     General: No scleral icterus.    Extraocular Movements: Extraocular movements intact.     Conjunctiva/sclera:  Conjunctivae normal.     Pupils: Pupils are equal, round, and reactive to light.  Cardiovascular:     Rate and Rhythm: Normal rate and regular rhythm.     Pulses: Normal pulses.     Heart sounds: Normal heart sounds. No murmur heard.    No friction rub. No gallop.  Pulmonary:     Effort: Pulmonary effort is normal.     Breath sounds: Normal breath sounds. No wheezing, rhonchi or rales.  Abdominal:     General: Abdomen is flat. Bowel sounds are normal. There is no distension.     Palpations: Abdomen is soft.     Tenderness: There is no abdominal tenderness.  Musculoskeletal:        General: No swelling. Normal range of motion.     Cervical back: Normal range of motion.     Right lower leg: No edema.     Left lower leg: No edema.  Lymphadenopathy:     Cervical: No cervical adenopathy.  Skin:    General:  Skin is warm and dry.     Capillary Refill: Capillary refill takes less than 2 seconds.     Coloration: Skin is not jaundiced.  Neurological:     General: No focal deficit present.     Mental Status: She is alert and oriented to person, place, and time.  Psychiatric:        Mood and Affect: Mood normal.        Behavior: Behavior normal.   Last CBC Lab Results  Component Value Date   WBC 8.4 05/08/2021   HGB 14.8 05/08/2021   HCT 43.2 05/08/2021   MCV 92 05/08/2021   MCH 31.5 05/08/2021   RDW 12.4 05/08/2021   PLT 248 05/08/2021   Last metabolic panel Lab Results  Component Value Date   GLUCOSE 424 (H) 11/13/2021   NA 136 11/13/2021   K 4.5 11/13/2021   CL 100 11/13/2021   CO2 21 11/13/2021   BUN 11 11/13/2021   CREATININE 0.64 11/13/2021   EGFR 114 11/13/2021   CALCIUM 9.8 11/13/2021   PROT 6.6 11/13/2021   ALBUMIN 3.9 11/13/2021   LABGLOB 2.7 11/13/2021   AGRATIO 1.4 11/13/2021   BILITOT 0.4 11/13/2021   ALKPHOS 79 11/13/2021   AST 11 11/13/2021   ALT 9 11/13/2021   ANIONGAP 13 08/21/2019   Last lipids Lab Results  Component Value Date   CHOL 168 07/07/2021   HDL 55 07/07/2021   LDLCALC 102 (H) 07/07/2021   TRIG 56 07/07/2021   CHOLHDL 3.1 07/07/2021   Last hemoglobin A1c Lab Results  Component Value Date   HGBA1C 8.5 (A) 06/26/2022   Last thyroid functions Lab Results  Component Value Date   TSH 3.112 11/29/2012   The 10-year ASCVD risk score (Arnett DK, et al., 2019) is: 1.2%    Assessment & Plan:   Problem List Items Addressed This Visit       Type 2 diabetes mellitus with hyperglycemia (HCC) - Primary    POC A1c today has improved to 8.5 from 11.4 previously.  She is currently prescribed Mounjaro 5 mg weekly, Tresiba 35 units nightly, and metformin XR 1000 mg twice daily.  She checks her blood sugar regularly and reports readings consistently above goal, often 200s. -Increase Mounjaro to 7.5 mg weekly.  We will plan to further increase to  10 mg weekly after 4 weeks. -Continue Tresiba and metformin at current doses -Follow-up in 3 months for repeat A1c  Neuropathy    Symptoms are adequately controlled with gabapentin 1200 mg 3 times daily.       Return in about 3 months (around 09/26/2022).    Samantha Lade, MD

## 2022-06-26 NOTE — Assessment & Plan Note (Signed)
POC A1c today has improved to 8.5 from 11.4 previously.  She is currently prescribed Mounjaro 5 mg weekly, Tresiba 35 units nightly, and metformin XR 1000 mg twice daily.  She checks her blood sugar regularly and reports readings consistently above goal, often 200s. -Increase Mounjaro to 7.5 mg weekly.  We will plan to further increase to 10 mg weekly after 4 weeks. -Continue Tresiba and metformin at current doses -Follow-up in 3 months for repeat A1c

## 2022-06-26 NOTE — Assessment & Plan Note (Signed)
Symptoms are adequately controlled with gabapentin 1200 mg 3 times daily.

## 2022-06-27 LAB — LIPID PANEL
Chol/HDL Ratio: 3 ratio (ref 0.0–4.4)
Cholesterol, Total: 121 mg/dL (ref 100–199)
HDL: 41 mg/dL (ref >39)
LDL Chol Calc (NIH): 65 mg/dL (ref 0–99)
Triglycerides: 70 mg/dL (ref 0–149)
VLDL Cholesterol Cal: 15 mg/dL (ref 5–40)

## 2022-10-04 ENCOUNTER — Ambulatory Visit: Payer: 59 | Admitting: Internal Medicine

## 2022-10-26 ENCOUNTER — Telehealth: Payer: Self-pay | Admitting: Internal Medicine

## 2022-10-26 ENCOUNTER — Other Ambulatory Visit: Payer: Self-pay

## 2022-10-26 DIAGNOSIS — G629 Polyneuropathy, unspecified: Secondary | ICD-10-CM

## 2022-10-26 MED ORDER — GABAPENTIN 600 MG PO TABS
1200.0000 mg | ORAL_TABLET | Freq: Three times a day (TID) | ORAL | 2 refills | Status: DC
Start: 2022-10-26 — End: 2022-12-10

## 2022-10-26 NOTE — Telephone Encounter (Signed)
Refills sent to pharmacy. 

## 2022-10-26 NOTE — Telephone Encounter (Signed)
Patient called will run out of her gabapentin (NEURONTIN) 600 MG tablet [[id number]]  ENDED   since her appointment was rescheduled and can  not get back in until 12/07/2022, asking prescribe enough til this appointment.  Pharmacy: [facility]

## 2022-11-12 ENCOUNTER — Ambulatory Visit: Payer: 59 | Admitting: Internal Medicine

## 2022-12-10 ENCOUNTER — Ambulatory Visit (INDEPENDENT_AMBULATORY_CARE_PROVIDER_SITE_OTHER): Payer: 59 | Admitting: Internal Medicine

## 2022-12-10 ENCOUNTER — Encounter: Payer: Self-pay | Admitting: Internal Medicine

## 2022-12-10 VITALS — BP 128/81 | HR 101 | Ht 63.0 in | Wt 147.0 lb

## 2022-12-10 DIAGNOSIS — E663 Overweight: Secondary | ICD-10-CM

## 2022-12-10 DIAGNOSIS — R002 Palpitations: Secondary | ICD-10-CM | POA: Insufficient documentation

## 2022-12-10 DIAGNOSIS — E785 Hyperlipidemia, unspecified: Secondary | ICD-10-CM

## 2022-12-10 DIAGNOSIS — Z794 Long term (current) use of insulin: Secondary | ICD-10-CM

## 2022-12-10 DIAGNOSIS — I152 Hypertension secondary to endocrine disorders: Secondary | ICD-10-CM

## 2022-12-10 DIAGNOSIS — F419 Anxiety disorder, unspecified: Secondary | ICD-10-CM

## 2022-12-10 DIAGNOSIS — E1165 Type 2 diabetes mellitus with hyperglycemia: Secondary | ICD-10-CM | POA: Diagnosis not present

## 2022-12-10 DIAGNOSIS — G629 Polyneuropathy, unspecified: Secondary | ICD-10-CM

## 2022-12-10 DIAGNOSIS — R233 Spontaneous ecchymoses: Secondary | ICD-10-CM | POA: Diagnosis not present

## 2022-12-10 DIAGNOSIS — E1159 Type 2 diabetes mellitus with other circulatory complications: Secondary | ICD-10-CM

## 2022-12-10 MED ORDER — TIRZEPATIDE 10 MG/0.5ML ~~LOC~~ SOAJ: 10.0000 mg | SUBCUTANEOUS | 0 refills | Status: DC

## 2022-12-10 MED ORDER — HYDROXYZINE PAMOATE 25 MG PO CAPS: 25.0000 mg | ORAL_CAPSULE | Freq: Three times a day (TID) | ORAL | 2 refills | Status: DC | PRN

## 2022-12-10 MED ORDER — AMLODIPINE BESYLATE 5 MG PO TABS: 5.0000 mg | ORAL_TABLET | Freq: Every day | ORAL | 2 refills | Status: DC

## 2022-12-10 MED ORDER — GABAPENTIN 600 MG PO TABS: 1200.0000 mg | ORAL_TABLET | Freq: Three times a day (TID) | ORAL | 2 refills | Status: DC

## 2022-12-10 NOTE — Assessment & Plan Note (Addendum)
Occurring inconsistently.  Regular rate and rhythm detected on exam today.  She states she will go months without experiencing palpitations and then they will occur daily for a week.  She endorses associated dizziness/lightheadedness and shortness of breath. -Cardiology referral placed today for further evaluation

## 2022-12-10 NOTE — Assessment & Plan Note (Signed)
Remains adequately controlled with amlodipine and olmesartan.  No medication changes are indicated today.

## 2022-12-10 NOTE — Progress Notes (Signed)
Established Patient Office Visit  Subjective   Patient ID: Samantha Blackburn, female    DOB: Jun 23, 1980  Age: 42 y.o. MRN: [id number]  Chief Complaint  Patient presents with   Diabetes    Follow up    Ms. Cisco returns to care today for routine follow-up.  She was last evaluated by me on 6/11.  Mounjaro was increased to 7.5 mg weekly at that time.  49-month follow-up was arranged for repeat labs.  In the interim, she has been evaluated by gynecology for follow-up.  There have otherwise been no acute interval events.  Ms. Reigel reports feeling well today.  Her acute concerns are easy bruising.  This was noted by her gynecologist recently.  CBC showed cell counts within normal limits.  She also endorses intermittent palpitations.  These occur inconsistently.  She will go months without experiencing palpitations and then have a week where they occur every day.  Not associated with anxiety.  She does endorse associated dizziness and shortness of breath.  Past Medical History:  Diagnosis Date   Diabetes mellitus without complication (HCC)    Diabetes mellitus, new onset (HCC) 11/29/2012   Obesity 12/01/2012   Trichimoniasis    Past Surgical History:  Procedure Laterality Date   CHOLECYSTECTOMY     Social History   Tobacco Use   Smoking status: Former    Current packs/day: 0.00    Average packs/day: 1 pack/day for 10.0 years (10.0 ttl pk-yrs)    Types: Cigarettes    Start date: 05/09/2000    Quit date: 05/10/2010    Years since quitting: 12.5   Smokeless tobacco: Never  Vaping Use   Vaping status: Never Used  Substance Use Topics   Alcohol use: No   Drug use: No   Family History  Problem Relation Age of Onset   Hypertension Mother    Diabetes Mother    Cancer Father        lymphoma, kidney,bladder, liver,prostate   Diabetes Father    Heart attack Father    Diabetes Maternal Grandmother    Heart attack Maternal Grandfather    Stroke Paternal Grandmother    Allergies   Allergen Reactions   Cymbalta [Duloxetine Hcl]     Can't take it   Morphine    Review of Systems  Cardiovascular:  Positive for palpitations (intermittent).  Skin:        Easy bruising  All other systems reviewed and are negative.    Objective:     BP 128/81 (BP Location: Right Arm, Patient Position: Sitting, Cuff Size: Normal)   Pulse (!) 101   Ht 5\' 3"  (1.6 m)   Wt 147 lb (66.7 kg)   SpO2 98%   BMI 26.04 kg/m  BP Readings from Last 3 Encounters:  12/10/22 128/81  06/26/22 139/87  03/20/22 138/88   Physical Exam Vitals reviewed.  Constitutional:      General: She is not in acute distress.    Appearance: Normal appearance. She is not toxic-appearing.  HENT:     Head: Normocephalic and atraumatic.     Right Ear: External ear normal.     Left Ear: External ear normal.     Nose: Nose normal. No congestion or rhinorrhea.     Mouth/Throat:     Mouth: Mucous membranes are moist.     Pharynx: Oropharynx is clear. No oropharyngeal exudate or posterior oropharyngeal erythema.  Eyes:     General: No scleral icterus.    Extraocular Movements: Extraocular  movements intact.     Conjunctiva/sclera: Conjunctivae normal.     Pupils: Pupils are equal, round, and reactive to light.  Cardiovascular:     Rate and Rhythm: Normal rate and regular rhythm.     Pulses: Normal pulses.     Heart sounds: Normal heart sounds. No murmur heard.    No friction rub. No gallop.  Pulmonary:     Effort: Pulmonary effort is normal.     Breath sounds: Normal breath sounds. No wheezing, rhonchi or rales.  Abdominal:     General: Abdomen is flat. Bowel sounds are normal. There is no distension.     Palpations: Abdomen is soft.     Tenderness: There is no abdominal tenderness.  Musculoskeletal:        General: No swelling. Normal range of motion.     Cervical back: Normal range of motion.     Right lower leg: No edema.     Left lower leg: No edema.  Lymphadenopathy:     Cervical: No cervical  adenopathy.  Skin:    General: Skin is warm and dry.     Capillary Refill: Capillary refill takes less than 2 seconds.     Coloration: Skin is not jaundiced.  Neurological:     General: No focal deficit present.     Mental Status: She is alert and oriented to person, place, and time.  Psychiatric:        Mood and Affect: Mood normal.        Behavior: Behavior normal.    Diabetic foot exam was performed.  Visual Findings: abrasion on 3rd digit of right foot, left heel abrasion, healing wound on 2nd digit of left foot Posterior tibialis and dorsalis pulse intact bilaterally.  Intact to touch and monofilament testing bilaterally.    Last CBC Lab Results  Component Value Date   WBC 8.4 05/08/2021   HGB 14.8 05/08/2021   HCT 43.2 05/08/2021   MCV 92 05/08/2021   MCH 31.5 05/08/2021   RDW 12.4 05/08/2021   PLT 248 05/08/2021   Last metabolic panel Lab Results  Component Value Date   GLUCOSE 424 (H) 11/13/2021   NA 136 11/13/2021   K 4.5 11/13/2021   CL 100 11/13/2021   CO2 21 11/13/2021   BUN 11 11/13/2021   CREATININE 0.64 11/13/2021   EGFR 114 11/13/2021   CALCIUM 9.8 11/13/2021   PROT 6.6 11/13/2021   ALBUMIN 3.9 11/13/2021   LABGLOB 2.7 11/13/2021   AGRATIO 1.4 11/13/2021   BILITOT 0.4 11/13/2021   ALKPHOS 79 11/13/2021   AST 11 11/13/2021   ALT 9 11/13/2021   ANIONGAP 13 08/21/2019   Last lipids Lab Results  Component Value Date   CHOL 121 06/26/2022   HDL 41 06/26/2022   LDLCALC 65 06/26/2022   TRIG 70 06/26/2022   CHOLHDL 3.0 06/26/2022   Last hemoglobin A1c Lab Results  Component Value Date   HGBA1C 8.5 (A) 06/26/2022   Last thyroid functions Lab Results  Component Value Date   TSH 3.112 11/29/2012     Assessment & Plan:   Problem List Items Addressed This Visit       Hypertension associated with diabetes (HCC)    Remains adequately controlled with amlodipine and olmesartan.  No medication changes are indicated today.      Type 2  diabetes mellitus with hyperglycemia (HCC)    A1c 8.5 on labs in June.  Mounjaro was increased to 7.5 mg weekly at that time.  She is additionally prescribed Tresiba 35 units nightly and metformin XR 1000 mg twice daily.  She reports that she reduced metformin to 500 mg daily due to GI side effects.  She also states that she was off Mounjaro for a month due to cost.  She recently resume Mounjaro at 7.5 mg weekly.  AM blood sugar readings are around 140. -Repeat A1c and urine microalbumin/creatinine ratio ordered today -Increase Mounjaro to 10 mg weekly -She will attempt to increase metformin XR to 500 mg twice daily -Continue Tresiba as currently prescribed -Diabetic foot exam completed today      Heart palpitations    Occurring inconsistently.  Regular rate and rhythm detected on exam today.  She states she will go months without experiencing palpitations and then they will occur daily for a week.  She endorses associated dizziness/lightheadedness and shortness of breath. -Cardiology referral placed today for further evaluation      Easy bruising    Her acute concern today is easy bruising.  She states that she will develop bruises on her legs that last for months.  This was recently noted by her gynecologist, who checked a CBC.  Cell counts were within normal limits.  She denies recent prolonged bleeding or easily bleeding.  She is unaware of any family history of coagulopathy. -Repeat CBC and coags ordered today      Return in about 3 months (around 03/12/2023).   Billie Lade, MD

## 2022-12-10 NOTE — Assessment & Plan Note (Addendum)
Her acute concern today is easy bruising.  She states that she will develop bruises on her legs that last for months.  This was recently noted by her gynecologist, who checked a CBC.  Cell counts were within normal limits.  She denies recent prolonged bleeding or easily bleeding.  She is unaware of any family history of coagulopathy. -Repeat CBC and coags ordered today

## 2022-12-10 NOTE — Patient Instructions (Signed)
It was a pleasure to see you today.  Thank you for giving [place] the opportunity to be involved in your care.  Below is a brief recap of your visit and next steps.  We will plan to see you again in 3 months.  Summary Increase Mounjaro to 10 mg weekly Repeat labs ordered Cardiology referral placed Follow up in 3 months

## 2022-12-10 NOTE — Assessment & Plan Note (Signed)
A1c 8.5 on labs in June.  Mounjaro was increased to 7.5 mg weekly at that time.  She is additionally prescribed Tresiba 35 units nightly and metformin XR 1000 mg twice daily.  She reports that she reduced metformin to 500 mg daily due to GI side effects.  She also states that she was off Mounjaro for a month due to cost.  She recently resume Mounjaro at 7.5 mg weekly.  AM blood sugar readings are around 140. -Repeat A1c and urine microalbumin/creatinine ratio ordered today -Increase Mounjaro to 10 mg weekly -She will attempt to increase metformin XR to 500 mg twice daily -Continue Tresiba as currently prescribed -Diabetic foot exam completed today

## 2022-12-13 LAB — CMP14+EGFR
ALT: 13 [IU]/L (ref 0–32)
AST: 13 [IU]/L (ref 0–40)
Albumin: 4 g/dL (ref 3.9–4.9)
Alkaline Phosphatase: 94 [IU]/L (ref 44–121)
BUN/Creatinine Ratio: 11 (ref 9–23)
BUN: 8 mg/dL (ref 6–24)
Bilirubin Total: 0.3 mg/dL (ref 0.0–1.2)
CO2: 20 mmol/L (ref 20–29)
Calcium: 9.1 mg/dL (ref 8.7–10.2)
Chloride: 96 mmol/L (ref 96–106)
Creatinine, Ser: 0.74 mg/dL (ref 0.57–1.00)
Globulin, Total: 2.5 g/dL (ref 1.5–4.5)
Sodium: 132 mmol/L — ABNORMAL LOW (ref 134–144)
Total Protein: 6.5 g/dL (ref 6.0–8.5)
eGFR: 104 mL/min/{1.73_m2} (ref >59)

## 2022-12-13 LAB — CBC WITH DIFFERENTIAL/PLATELET
Basophils Absolute: 0 10*3/uL (ref 0.0–0.2)
Basos: 1 %
EOS (ABSOLUTE): 0.1 10*3/uL (ref 0.0–0.4)
Eos: 1 %
Hematocrit: 37.3 % (ref 34.0–46.6)
Hemoglobin: 12.7 g/dL (ref 11.1–15.9)
Immature Grans (Abs): 0 10*3/uL (ref 0.0–0.1)
Immature Granulocytes: 0 %
Lymphocytes Absolute: 2.2 10*3/uL (ref 0.7–3.1)
Lymphs: 33 %
MCH: 31.8 pg (ref 26.6–33.0)
MCHC: 34 g/dL (ref 31.5–35.7)
MCV: 94 fL (ref 79–97)
Monocytes Absolute: 0.5 10*3/uL (ref 0.1–0.9)
Monocytes: 7 %
Neutrophils Absolute: 3.9 10*3/uL (ref 1.4–7.0)
Neutrophils: 58 %
Platelets: 236 10*3/uL (ref 150–450)
RBC: 3.99 x10E6/uL (ref 3.77–5.28)
RDW: 11.7 % (ref 11.7–15.4)
WBC: 6.7 10*3/uL (ref 3.4–10.8)

## 2022-12-13 LAB — B12 AND FOLATE PANEL
Folate: 11.1 ng/mL (ref >3.0)
Vitamin B-12: 389 pg/mL (ref 232–1245)

## 2022-12-13 LAB — APTT: aPTT: 25 s (ref 24–33)

## 2022-12-13 LAB — HEMOGLOBIN A1C
Est. average glucose Bld gHb Est-mCnc: 312 mg/dL
Hgb A1c MFr Bld: 12.5 % — ABNORMAL HIGH (ref 4.8–5.6)

## 2022-12-13 LAB — MICROALBUMIN / CREATININE URINE RATIO: Creatinine, Urine: 33.6 mg/dL

## 2022-12-13 LAB — VITAMIN D 25 HYDROXY (VIT D DEFICIENCY, FRACTURES): Vit D, 25-Hydroxy: 21.7 ng/mL — ABNORMAL LOW (ref 30.0–100.0)

## 2022-12-13 LAB — PROTIME-INR
INR: 0.9 (ref 0.9–1.2)
Prothrombin Time: 10.4 s (ref 9.1–12.0)

## 2022-12-13 LAB — TSH+FREE T4
Free T4: 1.37 ng/dL (ref 0.82–1.77)
TSH: 1.87 u[IU]/mL (ref 0.450–4.500)

## 2022-12-25 ENCOUNTER — Telehealth: Payer: Self-pay

## 2022-12-25 NOTE — Telephone Encounter (Signed)
Copied from CRM [id number]. Topic: Referral - Status >> Dec 25, 2022  9:17 AM Prudencio Pair wrote: Reason for CRM: Patient called stating that she has not heard anything back from the referral to Cardiology. Advised pt that the referral was sent and as 11/25, it was pending review to authorized. Patient wants to know if someone will give her a call to schedule appt.

## 2022-12-27 ENCOUNTER — Encounter: Payer: Self-pay | Admitting: Cardiology

## 2022-12-27 ENCOUNTER — Ambulatory Visit: Payer: 59 | Attending: Cardiology | Admitting: Cardiology

## 2022-12-27 VITALS — BP 120/80 | HR 100 | Ht 64.0 in | Wt 145.0 lb

## 2022-12-27 DIAGNOSIS — R002 Palpitations: Secondary | ICD-10-CM | POA: Diagnosis not present

## 2022-12-27 NOTE — Patient Instructions (Addendum)
Medication Instructions:   Continue all current medications.   Labwork:  none  Testing/Procedures:  Your physician has recommended that you wear a 14 day event monitor. Event monitors are medical devices that record the heart's electrical activity. Doctors most often [place] these monitors to diagnose arrhythmias. Arrhythmias are problems with the speed or rhythm of the heartbeat. The monitor is a small, portable device. You can wear one while you do your normal daily activities. This is usually used to diagnose what is causing palpitations/syncope (passing out). Office will contact with results via phone, letter or mychart.     Follow-Up:  6 weeks    Any Other Special Instructions Will Be Listed Below (If Applicable).   If you need a refill on your cardiac medications before your next appointment, please call your pharmacy.

## 2022-12-27 NOTE — Progress Notes (Signed)
Clinical Summary Samantha Blackburn is a 42 y.o.female seen today as a new consult, referred by Dr Durwin Nora for the following medical problems.  1.Palpitations - symptoms started few years ago, right after she got covid - has increased in frequency over time. Now occurs every few days, can last few hours. Can have some associated dizziness, some SOB - had checked HR in the past up 128.   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 30 tablet 2   atorvastatin (LIPITOR) 40 MG tablet Take 1 tablet (40 mg total) by mouth daily. 90 tablet 3   benzonatate (TESSALON) 200 MG capsule Take 1 capsule (200 mg total) by mouth 2 (two) times daily as needed for cough. 20 capsule 0   blood glucose meter kit and supplies Dispense based on patient and insurance preference. Use up to four times daily as directed. (FOR ICD-10 E10.9, E11.9). 1 each 11   cetirizine (ZYRTEC) 10 MG tablet Take 1 tablet (10 mg total) by mouth daily. 30 tablet 11   gabapentin (NEURONTIN) 600 MG tablet Take 2 tablets (1,200 mg total) by mouth 3 (three) times daily. 180 tablet 2   hydrOXYzine (VISTARIL) 25 MG capsule Take 1 capsule (25 mg total) by mouth every 8 (eight) hours as needed. 30 capsule 2   ibuprofen (ADVIL) 800 MG tablet Take 800 mg by mouth 3 (three) times daily.     insulin aspart (NOVOLOG) 100 UNIT/ML FlexPen Sliding scale TID with meals. Administer 7 units for blood sugar 70-139, 9 units for blood sugar 140-180, 11 units for blood sugar  181-240, 13 units for blood sugar 241-300, 15 units for blood sugar 301-350,  17 units for bloods sugar 351-400,  and call MD and administer 19 units if blood sugar > 400. 45 mL 1   insulin degludec (TRESIBA FLEXTOUCH) 100 UNIT/ML FlexTouch Pen Inject 24 to 50 units into the skin at bedtime. 15 mL 2   Insulin Pen Needle (PEN NEEDLES) 33G X 4 MM MISC 1 each by Does not apply route 4 (four) times daily - after meals and at bedtime. 300 each 3   lidocaine (XYLOCAINE) 2 % solution Use as directed 5 mLs in the mouth or throat every 6 (six) hours as needed for mouth pain. 100 mL 0   metFORMIN (GLUCOPHAGE-XR) 500 MG 24 hr tablet Take 2 tablets (1,000 mg total) by mouth 2 (two) times daily with a meal. Take 500 mg by mouth once daily x1 week, then increase to 500 mg twice daily x1 week, then 1,000 mg in the AM and 500 mg in the PM x1 week, and finally 1,000 mg twice daily. Take with food. 360 tablet 1   norgestimate-ethinyl estradiol (ORTHO-CYCLEN) 0.25-35 MG-MCG tablet Take 1 tablet by mouth daily.     olmesartan (BENICAR) 40 MG tablet Take 1 tablet (40 mg total) by mouth daily. 90 tablet 1   SLYND  4 MG TABS Take 1 tablet by mouth daily.     sodium chloride (OCEAN) 0.65 % SOLN nasal spray Place 1 spray into both nostrils as needed for congestion. 15 mL 0   tirzepatide (MOUNJARO) 10 MG/0.5ML Pen Inject 10 mg into the skin once a week. 6 mL 0   No current facility-administered medications for this visit.     Past Surgical History:  Procedure Laterality Date   CHOLECYSTECTOMY       Allergies  Allergen Reactions   Cymbalta [Duloxetine Hcl]     Can't take it   Morphine       Family History  Problem Relation Age of Onset   Hypertension Mother    Diabetes Mother    Cancer Father        lymphoma, kidney,bladder, liver,prostate   Diabetes Father    Heart attack Father    Diabetes Maternal Grandmother    Heart attack Maternal Grandfather    Stroke Paternal Grandmother      Social  History Samantha Blackburn reports that she quit smoking about 12 years ago. Her smoking use included cigarettes. She started smoking about 22 years ago. She has a 10 pack-year smoking history. She has never used smokeless tobacco. Samantha Blackburn reports no history of alcohol use.   Review of Systems CONSTITUTIONAL: No weight loss, fever, chills, weakness or fatigue.  HEENT: Eyes: No visual loss, blurred vision, double vision or yellow sclerae.No hearing loss, sneezing, congestion, runny nose or sore throat.  SKIN: No rash or itching.  CARDIOVASCULAR: per hpi RESPIRATORY: No shortness of breath, cough or sputum.  GASTROINTESTINAL: No anorexia, nausea, vomiting or diarrhea. No abdominal pain or blood.  GENITOURINARY: No burning on urination, no polyuria NEUROLOGICAL: No headache, dizziness, syncope, paralysis, ataxia, numbness or tingling in the extremities. No change in bowel or bladder control.  MUSCULOSKELETAL: No muscle, back pain, joint pain or stiffness.  LYMPHATICS: No enlarged nodes. No history of splenectomy.  PSYCHIATRIC: No history of depression or anxiety.  ENDOCRINOLOGIC: No reports of sweating, cold or heat intolerance. No polyuria or polydipsia.  Marland Kitchen   Physical Examination Today's Vitals   12/27/22 0903  BP: 120/80  Pulse: 100  SpO2: 99%  Weight: 145 lb (65.8 kg)  Height: 5\' 4"  (1.626 m)   Body mass index is 24.89 kg/m.  Gen: resting comfortably, no acute distress HEENT: no scleral icterus, pupils equal round and reactive, no palptable cervical adenopathy,  CV: RRR, no mrg, no jvd Resp: Clear to auscultation bilaterally GI: abdomen is soft, non-tender, non-distended, normal bowel sounds, no hepatosplenomegaly MSK: extremities are warm, no edema.  Skin: warm, no rash Neuro:  no focal deficits Psych: appropriate affect     Assessment and Plan  1.Palpitations - EKG today shows mild sinus tach 102 - plan for 2 week zio patch to further evaluate - f/u 6  weeks      Antoine Poche, M.D.

## 2023-01-03 ENCOUNTER — Other Ambulatory Visit: Payer: Self-pay | Admitting: Cardiology

## 2023-01-03 ENCOUNTER — Ambulatory Visit: Payer: 59 | Attending: Cardiology

## 2023-01-03 DIAGNOSIS — R002 Palpitations: Secondary | ICD-10-CM

## 2023-01-07 DIAGNOSIS — R002 Palpitations: Secondary | ICD-10-CM

## 2023-02-07 ENCOUNTER — Ambulatory Visit: Payer: No Typology Code available for payment source | Admitting: Nurse Practitioner

## 2023-03-05 ENCOUNTER — Telehealth: Payer: No Typology Code available for payment source | Admitting: Physician Assistant

## 2023-03-05 DIAGNOSIS — L089 Local infection of the skin and subcutaneous tissue, unspecified: Secondary | ICD-10-CM

## 2023-03-05 DIAGNOSIS — T148XXA Other injury of unspecified body region, initial encounter: Secondary | ICD-10-CM | POA: Diagnosis not present

## 2023-03-05 MED ORDER — DOXYCYCLINE HYCLATE 100 MG PO TABS: 100.0000 mg | ORAL_TABLET | Freq: Two times a day (BID) | ORAL | 0 refills | Status: DC

## 2023-03-05 NOTE — Patient Instructions (Signed)
 Samantha Blackburn, thank you for joining Piedad Climes, PA-C for today's virtual visit.  While this provider is not your primary care provider (PCP), if your PCP is located in our provider database this encounter information will be shared with them immediately following your visit.   A [facility] MyChart account gives you access to today's visit and all your visits, tests, and labs performed at [facility] " click here if you don't have a  MyChart account or go to [email or web address]  Consent: (Patient) Samantha Blackburn provided verbal consent for this virtual visit at the beginning of the encounter.  Current Medications:  Current Outpatient Medications:    amLODipine (NORVASC) 5 MG tablet, Take 1 tablet (5 mg total) by mouth daily., Disp: 30 tablet, Rfl: 2   atorvastatin (LIPITOR) 40 MG tablet, Take 1 tablet (40 mg total) by mouth daily., Disp: 90 tablet, Rfl: 3   benzonatate (TESSALON) 200 MG capsule, Take 1 capsule (200 mg total) by mouth 2 (two) times daily as needed for cough. (Patient not taking: Reported on 12/27/2022), Disp: 20 capsule, Rfl: 0   blood glucose meter kit and supplies, Dispense based on patient and insurance preference. Use up to four times daily as directed. (FOR ICD-10 E10.9, E11.9)., Disp: 1 each, Rfl: 11   cetirizine (ZYRTEC) 10 MG tablet, Take 1 tablet (10 mg total) by mouth daily., Disp: 30 tablet, Rfl: 11   gabapentin (NEURONTIN) 600 MG tablet, Take 2 tablets (1,200 mg total) by mouth 3 (three) times daily., Disp: 180 tablet, Rfl: 2   hydrOXYzine (VISTARIL) 25 MG capsule, Take 1 capsule (25 mg total) by mouth every 8 (eight) hours as needed., Disp: 30 capsule, Rfl: 2   ibuprofen (ADVIL) 800 MG tablet, Take 800 mg by mouth 3 (three) times daily., Disp: , Rfl:    insulin aspart (NOVOLOG) 100 UNIT/ML FlexPen, Sliding scale TID with meals. Administer 7 units for blood sugar 70-139, 9 units for blood sugar 140-180, 11 units for blood  sugar 181-240, 13 units for blood sugar 241-300, 15 units for blood sugar 301-350,  17 units for bloods sugar 351-400,  and call MD and administer 19 units if blood sugar > 400., Disp: 45 mL, Rfl: 1   insulin degludec (TRESIBA FLEXTOUCH) 100 UNIT/ML FlexTouch Pen, Inject 24 to 50 units into the skin at bedtime., Disp: 15 mL, Rfl: 2   Insulin Pen Needle (PEN NEEDLES) 33G X 4 MM MISC, 1 each by Does not apply route 4 (four) times daily - after meals and at bedtime., Disp: 300 each, Rfl: 3   lidocaine (XYLOCAINE) 2 % solution, Use as directed 5 mLs in the mouth or throat every 6 (six) hours as needed for mouth pain. (Patient not taking: Reported on 12/27/2022), Disp: 100 mL, Rfl: 0   metFORMIN (GLUCOPHAGE-XR) 500 MG 24 hr tablet, Take 2 tablets (1,000 mg total) by mouth 2 (two) times daily with a meal. Take 500 mg by mouth once daily x1 week, then increase to 500 mg twice daily x1 week, then 1,000 mg in the AM and 500 mg in the PM x1 week, and finally 1,000 mg twice daily. Take with food., Disp: 360 tablet, Rfl: 1   norgestimate-ethinyl estradiol (ORTHO-CYCLEN) 0.25-35 MG-MCG tablet, Take 1 tablet by mouth daily. (Patient not taking: Reported on 12/27/2022), Disp: , Rfl:    olmesartan (BENICAR) 40 MG tablet, Take 1 tablet (40 mg total) by mouth daily., Disp: 90 tablet, Rfl: 1   SLYND 4 MG TABS,  Take 1 tablet by mouth daily., Disp: , Rfl:    sodium chloride (OCEAN) 0.65 % SOLN nasal spray, Place 1 spray into both nostrils as needed for congestion. (Patient not taking: Reported on 12/27/2022), Disp: 15 mL, Rfl: 0   tirzepatide (MOUNJARO) 10 MG/0.5ML Pen, Inject 10 mg into the skin once a week., Disp: 6 mL, Rfl: 0   Medications ordered in this encounter:  No orders of the defined types were placed in this encounter.    *If you need refills on other medications prior to your next appointment, please contact your pharmacy*  Follow-Up: Call back or seek an in-person evaluation if the symptoms worsen or if  the condition fails to improve as anticipated.  [facility] [phone]  Other Instructions Keep the skin clean and dry as discussed. Stop Neosporin use. You can apply a very thin layer of Bacitracin after cleaning. Take the antibiotic as directed. As discussed, I want you to schedule a follow-up with your PCP next Monday at the latest. In person evaluation ASAP for any new or worsening symptoms despite treatment.   Cellulitis, Adult  Cellulitis is a skin infection. The infected area is often warm, red, swollen, and sore. It occurs most often on the legs, feet, and toes, but can happen on any part of the body. This condition can be life-threatening without treatment. It is very important to get treated right away. What are the causes? This condition is caused by bacteria. The bacteria enter through a break in the skin, such as: A cut. A burn. A bug bite. An animal bite. An open sore. A crack. What increases the risk? Having a weak body's defense system (immune system). Being older than 42 years old. Having a blood sugar problem (diabetes). Having a long-term liver disease (cirrhosis) or kidney disease. Being very overweight (obese). Having a skin problem, such as: An itchy rash. A rash caused by a fungus. A rash with blisters. Slow movement of blood in the veins (venous stasis). Fluid buildup below the skin (edema). This condition is more likely to occur in people who: Have open cuts, burns, bites, or scrapes on the skin. Have been treated with high-energy rays (radiation). Use IV drugs. What are the signs or symptoms? Skin that: Looks red or purple, or slightly darker than your usual skin color. Has streaks. Has spots. Is swollen. Is sore or painful when you touch it. Is warm. A fever. Chills. Blisters. Tiredness (fatigue). How is this treated? Medicines to treat infections or allergies. Rest. Placing cold or warm cloths on the skin. Staying in  the hospital, if the condition is very bad. You may need medicines through an IV. Follow these instructions at home: Medicines Take over-the-counter and prescription medicines only as told by your doctor. If you were prescribed antibiotics, take them as told by your doctor. Do not stop using them even if you start to feel better. General instructions Drink enough fluid to keep your pee (urine) pale yellow. Do not touch or rub the infected area. Raise (elevate) the infected area above the level of your heart while you are sitting or lying down. Return to your normal activities when your doctor says that it is safe. Place cold or warm cloths on the area as told by your doctor. Keep all follow-up visits. Your doctor will need to make sure that a more serious infection is not developing. Contact a doctor if: You have a fever. You do not start to get better after  1-2 days of treatment. Your bone or joint under the infected area starts to hurt after the skin has healed. Your infection comes back in the same area or another area. Signs of this may include: You have a swollen bump in the area. Your red area gets larger, turns dark in color, or hurts more. You have more fluid coming from the wound. Pus or a bad smell develops in your infected area. You have more pain. You feel sick and have muscle aches and weakness. You develop vomiting or watery poop that will not go away. Get help right away if: You see red streaks coming from the area. You notice the skin turns purple or black and falls off. These symptoms may be an emergency. Get help right away. Call 911. Do not wait to see if the symptoms will go away. Do not drive yourself to the hospital. This information is not intended to replace advice given to you by your health care provider. Make sure you discuss any questions you have with your health care provider. Document Revised: 08/29/2021 Document Reviewed: 08/29/2021 Elsevier Patient  Education  2024 [employer]   If you have been instructed to have an in-person evaluation today at a local Urgent Care facility, please use the link below. It will take you to a list of all of our available [facility], including address, phone number and hours of operation. Please do not delay care.  [facility]  If you or a family member do not have a primary care provider, use the link below to schedule a visit and establish care. When you choose a [facility] primary care physician or advanced practice provider, you gain a long-term partner in health. Find a Primary Care Provider  Learn more about [facility]'s in-office and virtual care options: [facility] - Get Care Now

## 2023-03-05 NOTE — Progress Notes (Signed)
 Virtual Visit Consent   Samantha Blackburn, you are scheduled for a virtual visit with a [facility] provider today. Just as with appointments in the office, your consent must be obtained to participate. Your consent will be active for this visit and any virtual visit you may have with one of our providers in the next 365 days. If you have a MyChart account, a copy of this consent can be sent to you electronically.  As this is a virtual visit, video technology does not allow for your provider to perform a traditional examination. This may limit your provider's ability to fully assess your condition. If your provider identifies any concerns that need to be evaluated in person or the need to arrange testing (such as labs, EKG, etc.), we will make arrangements to do so. Although advances in technology are sophisticated, we cannot ensure that it will always work on either your end or our end. If the connection with a video visit is poor, the visit may have to be switched to a telephone visit. With either a video or telephone visit, we are not always able to ensure that we have a secure connection.  By engaging in this virtual visit, you consent to the provision of healthcare and authorize for your insurance to be billed (if applicable) for the services provided during this visit. Depending on your insurance coverage, you may receive a charge related to this service.  I need to obtain your verbal consent now. Are you willing to proceed with your visit today? Samantha Blackburn has provided verbal consent on 03/05/2023 for a virtual visit (video or telephone). Samantha Blackburn, [place]  Date: 03/05/2023 7:02 PM   Virtual Visit via Video Note   I, Samantha Blackburn, connected with  Samantha Blackburn  ([id number], 07/08/1980) on 03/05/23 at  7:15 PM EST by a video-enabled telemedicine application and verified that I am speaking with the correct person using two identifiers.  Location: Patient: Virtual Visit  Location Patient: Home Provider: Virtual Visit Location Provider: Home Office   I discussed the limitations of evaluation and management by telemedicine and the availability of in person appointments. The patient expressed understanding and agreed to proceed.    History of Present Illness: Samantha Blackburn is a 43 y.o. who identifies as a female who was assigned female at birth, and is being seen today for wound of her R leg. Notes that she tripped and fell over husband's tennis shoe about a week ago and ran her foot/leg against the metal footboard. Scraped the skin. Notes trying to clean the area well and bandaging. Notes initially healing with start of scab formation. Now, over past few days noting redness around the area with tenderness and swelling.  Denies fever, chills. Notes she is able to bare weight on the foot/ankle without issue. Denies drainage from the wound. Has been applying Neosporin to the area throughout the day.   HPI: HPI  Problems:  Patient Active Problem List   Diagnosis Date Noted   Heart palpitations 12/10/2022   Easy bruising 12/10/2022   Anxiety 02/26/2022   Encounter for annual general medical examination with abnormal findings in adult 11/19/2021   Hyperlipidemia LDL goal <70 05/10/2021   Neuropathy 05/10/2021   Need for Tdap vaccination 05/10/2021   Hyponatremia 05/10/2021   Renal abscess 10/12/2020   Abnormal physical evaluation 05/26/2020   Type 2 diabetes mellitus with hyperglycemia (HCC) 05/09/2020   Hypertension associated with diabetes (HCC) 05/09/2020   History of  trichomoniasis 04/19/2016   Encounter to establish care 11/14/2015   Obesity 12/01/2012    Allergies:  Allergies  Allergen Reactions   Cymbalta [Duloxetine Hcl]     Can't take it   Morphine    Medications:  Current Outpatient Medications:    doxycycline (VIBRA-TABS) 100 MG tablet, Take 1 tablet (100 mg total) by mouth 2 (two) times daily., Disp: 14 tablet, Rfl: 0   amLODipine  (NORVASC) 5 MG tablet, Take 1 tablet (5 mg total) by mouth daily., Disp: 30 tablet, Rfl: 2   atorvastatin (LIPITOR) 40 MG tablet, Take 1 tablet (40 mg total) by mouth daily., Disp: 90 tablet, Rfl: 3   blood glucose meter kit and supplies, Dispense based on patient and insurance preference. Use up to four times daily as directed. (FOR ICD-10 E10.9, E11.9)., Disp: 1 each, Rfl: 11   cetirizine (ZYRTEC) 10 MG tablet, Take 1 tablet (10 mg total) by mouth daily., Disp: 30 tablet, Rfl: 11   gabapentin (NEURONTIN) 600 MG tablet, Take 2 tablets (1,200 mg total) by mouth 3 (three) times daily., Disp: 180 tablet, Rfl: 2   hydrOXYzine (VISTARIL) 25 MG capsule, Take 1 capsule (25 mg total) by mouth every 8 (eight) hours as needed., Disp: 30 capsule, Rfl: 2   ibuprofen (ADVIL) 800 MG tablet, Take 800 mg by mouth 3 (three) times daily., Disp: , Rfl:    insulin aspart (NOVOLOG) 100 UNIT/ML FlexPen, Sliding scale TID with meals. Administer 7 units for blood sugar 70-139, 9 units for blood sugar 140-180, 11 units for blood sugar 181-240, 13 units for blood sugar 241-300, 15 units for blood sugar 301-350,  17 units for bloods sugar 351-400,  and call MD and administer 19 units if blood sugar > 400., Disp: 45 mL, Rfl: 1   insulin degludec (TRESIBA FLEXTOUCH) 100 UNIT/ML FlexTouch Pen, Inject 24 to 50 units into the skin at bedtime., Disp: 15 mL, Rfl: 2   Insulin Pen Needle (PEN NEEDLES) 33G X 4 MM MISC, 1 each by Does not apply route 4 (four) times daily - after meals and at bedtime., Disp: 300 each, Rfl: 3   metFORMIN (GLUCOPHAGE-XR) 500 MG 24 hr tablet, Take 2 tablets (1,000 mg total) by mouth 2 (two) times daily with a meal. Take 500 mg by mouth once daily x1 week, then increase to 500 mg twice daily x1 week, then 1,000 mg in the AM and 500 mg in the PM x1 week, and finally 1,000 mg twice daily. Take with food., Disp: 360 tablet, Rfl: 1   norgestimate-ethinyl estradiol (ORTHO-CYCLEN) 0.25-35 MG-MCG tablet, Take 1 tablet  by mouth daily. (Patient not taking: Reported on 12/27/2022), Disp: , Rfl:    olmesartan (BENICAR) 40 MG tablet, Take 1 tablet (40 mg total) by mouth daily., Disp: 90 tablet, Rfl: 1   SLYND 4 MG TABS, Take 1 tablet by mouth daily., Disp: , Rfl:    sodium chloride (OCEAN) 0.65 % SOLN nasal spray, Place 1 spray into both nostrils as needed for congestion. (Patient not taking: Reported on 12/27/2022), Disp: 15 mL, Rfl: 0   tirzepatide (MOUNJARO) 10 MG/0.5ML Pen, Inject 10 mg into the skin once a week., Disp: 6 mL, Rfl: 0  Observations/Objective: Patient is well-developed, well-nourished in no acute distress.  Resting comfortably  at home.  Head is normocephalic, atraumatic.  No labored breathing.  Speech is clear and coherent with logical content.  Patient is alert and oriented at baseline.  Wounds as noted below with 4 areas of ulceration/puncture  wound with substantial surrounding erythema and soft-tissue swelling. Once of these areas with a macerated appearance. Some distal soft tissue swelling noted. Patient able to demonstrate normal ROM for foot/ankle.   Assessment and Plan: 1. Infected wound (Primary) - doxycycline (VIBRA-TABS) 100 MG tablet; Take 1 tablet (100 mg total) by mouth 2 (two) times daily.  Dispense: 14 tablet; Refill: 0  Tetanus up-to-date per chart review.  Patient is diabetic with non-healing, infected wounds of extremity.   Afebrile. No other alarm symptoms present. Discussed proper wound care with keeping area clean and dry. Stop Neosporin use. Recommend very thing layer of Bacitracin after cleaning. Keep bandaged when having to go out and about. Let air out some when resting at home. Doxycycline per orders.  Giving substantial infection and risks, in-person follow-up with PCP recommended.  She voices understanding.   Follow Up Instructions: I discussed the assessment and treatment plan with the patient. The patient was provided an opportunity to ask questions and  all were answered. The patient agreed with the plan and demonstrated an understanding of the instructions.  A copy of instructions were sent to the patient via MyChart unless otherwise noted below.   The patient was advised to call back or seek an in-person evaluation if the symptoms worsen or if the condition fails to improve as anticipated.    Samantha Climes, PA-C

## 2023-03-13 ENCOUNTER — Ambulatory Visit: Payer: 59 | Admitting: Internal Medicine

## 2023-03-21 ENCOUNTER — Ambulatory Visit: Payer: No Typology Code available for payment source | Admitting: Internal Medicine

## 2023-03-22 ENCOUNTER — Telehealth: Admitting: Family Medicine

## 2023-03-22 ENCOUNTER — Ambulatory Visit: Payer: No Typology Code available for payment source | Attending: Nurse Practitioner | Admitting: Nurse Practitioner

## 2023-03-22 ENCOUNTER — Encounter: Payer: Self-pay | Admitting: Nurse Practitioner

## 2023-03-22 VITALS — BP 118/68 | HR 98 | Ht 63.5 in | Wt 149.0 lb

## 2023-03-22 DIAGNOSIS — G901 Familial dysautonomia [Riley-Day]: Secondary | ICD-10-CM | POA: Diagnosis not present

## 2023-03-22 DIAGNOSIS — I4711 Inappropriate sinus tachycardia, so stated: Secondary | ICD-10-CM

## 2023-03-22 DIAGNOSIS — T148XXA Other injury of unspecified body region, initial encounter: Secondary | ICD-10-CM | POA: Diagnosis not present

## 2023-03-22 DIAGNOSIS — T148XXD Other injury of unspecified body region, subsequent encounter: Secondary | ICD-10-CM

## 2023-03-22 DIAGNOSIS — R42 Dizziness and giddiness: Secondary | ICD-10-CM

## 2023-03-22 DIAGNOSIS — E1165 Type 2 diabetes mellitus with hyperglycemia: Secondary | ICD-10-CM | POA: Diagnosis not present

## 2023-03-22 DIAGNOSIS — L089 Local infection of the skin and subcutaneous tissue, unspecified: Secondary | ICD-10-CM

## 2023-03-22 DIAGNOSIS — Z794 Long term (current) use of insulin: Secondary | ICD-10-CM

## 2023-03-22 MED ORDER — MEDICAL COMPRESSION STOCKINGS MISC: 1.0000 | 0 refills | Status: DC

## 2023-03-22 MED ORDER — METOPROLOL SUCCINATE ER 25 MG PO TB24: 12.5000 mg | ORAL_TABLET | Freq: Every day | ORAL | 1 refills | Status: DC

## 2023-03-22 MED ORDER — OLMESARTAN MEDOXOMIL 20 MG PO TABS: 20.0000 mg | ORAL_TABLET | Freq: Every day | ORAL | 1 refills | Status: DC

## 2023-03-22 NOTE — Progress Notes (Addendum)
 Cardiology Office Note:  .   Date: 03/22/2023 ID:  Samantha Blackburn, DOB [birth year], MRN [id number] PCP: Samantha Lade, MD  [facility] Providers Cardiologist:  Samantha Rich, MD    History of Present Illness: .   Samantha Blackburn is a 43 y.o. female with a PMH of palpitations, type 2 diabetes, and obesity, presents today for 6-week follow-up appointment.  Last seen by Dr. Dina Blackburn on December 27, 2022.  She was evaluated for palpitations that were chronic, started after contracting COVID-19.  Did note some associated dizziness and some shortness of breath.  Did admit to drinking mellow yellow up to 2 times per day, denied any alcohol or coffee intake.   14-day monitor revealed rare supraventricular ectopy and ventricular ectopy.  Was in normal sinus rhythm with average heart rate of 96 bpm.  Her reported symptoms correlated with normal sinus rhythm and mild sinus tachycardia, rare PVCs/PACs.  Today she presents for follow-up.  She admits to occasional episodes of racing heartbeat, says it is hard to catch her breath during these episodes, as it feels like she is running a marathon/ feels similar to a panic attack for her. Says she one time checked her HR and was found to be 129 bpm at rest at work, BP was found to be low. Denies any syncope, but admits to sensation of dizziness if turning too fast at times. Denies any chest pain, syncope, presyncope, orthopnea, PND, swelling or significant weight changes, acute bleeding, or claudication. Wants to know what she should do regarding a previous infected wound to LLE.    ROS: Negative. See HPI.   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 Father - positive for CABG, passed away 8 years ago from cancer  Studies Reviewed: Marland Kitchen    EKG: EKG is not ordered today.   Cardiac monitor 03/2023:   14 day monitor   Rare supraventricular  ectopy in the form of isolated PACs, couplets   Rare ventricular ectopy in the form of isolated PVCs, couplets, triplets   Reported symptoms correlate with normal sinus rhythm and mild sinus tachycardia, rare PACs and PVCs     Patch Wear Time:  13 days and 22 hours (2024-12-23T21:05:01-498 to 2025-01-06T19:32:51-0500)   Patient had a min HR of 72 bpm, max HR of 157 bpm, and avg HR of 96 bpm. Predominant underlying rhythm was Sinus Rhythm. Isolated SVEs were rare (<1.0%), SVE Couplets were rare (<1.0%), and no SVE Triplets were present. Isolated VEs were rare (<1.0%,  79), VE Couplets were rare (<1.0%, 6), and VE Triplets were rare (<1.0%, 1).    Physical Exam:   VS:  BP 118/68 (BP Location: Left Arm, Patient Position: Sitting, Cuff Size: Normal)   Pulse 98   Ht 5' 3.5" (1.613 m)   Wt 149 lb (67.6 kg)   SpO2 100%   BMI 25.98 kg/m    Wt Readings from Last 3 Encounters:  03/22/23 149 lb (67.6 kg)  12/27/22 145 lb (65.8 kg)  12/10/22 147 lb (66.7 kg)    GEN: Well nourished, well developed in no acute distress NECK: No JVD; No carotid bruits CARDIAC: S1/S2, RRR, no murmurs, rubs, gallops RESPIRATORY:  Clear to auscultation without rales, wheezing or rhonchi  ABDOMEN: Soft, non-tender, non-distended EXTREMITIES:  No edema; No deformity   ASSESSMENT AND PLAN: .    Dysautonomia/ Inappropriate Sinus Tachycardia?, Dizziness Past  hx of resting heart rate > 100 bpm, episode happened at work with HR at 129 bpm, low BP during episode, symptomatic during these episodes with Samantha Blackburn and feeling of panic attack. Denies any significant caffeine use. See most recent monitor noted above. Says mother has had past hx of tachycardia. Will stop Amlodipine and reduce Olmesartan to 20 mg daily and begin Toprol XL 12.5 mg daily. Discussed to monitor BP at home at least 2 hours after medications and sitting for 5-10 minutes. Discussed to notify our office if no improvement in symptoms. Discussed to wean off  caffeine, increase salt intake, and will write Rx for compression stockings. Care and ED precautions discussed.   2. Type 2 Diabetes, skin wound Being managed by PCP. Continue current medication management and continue to follow with PCP. Wound to LLE appears red around wound, scabbed, recommended to f/u with PCP/UC for further management. Care and ED precautions discussed.   Dispo: Follow-up with me/APP in 6-8 weeks or sooner if anything changes.   Signed, Sharlene Dory, NP

## 2023-03-22 NOTE — Progress Notes (Signed)
 Virtual Visit Consent   Samantha Blackburn, you are scheduled for a virtual visit with a [facility] provider today. Just as with appointments in the office, your consent must be obtained to participate. Your consent will be active for this visit and any virtual visit you may have with one of our providers in the next 365 days. If you have a MyChart account, a copy of this consent can be sent to you electronically.  As this is a virtual visit, video technology does not allow for your provider to perform a traditional examination. This may limit your provider's ability to fully assess your condition. If your provider identifies any concerns that need to be evaluated in person or the need to arrange testing (such as labs, EKG, etc.), we will make arrangements to do so. Although advances in technology are sophisticated, we cannot ensure that it will always work on either your end or our end. If the connection with a video visit is poor, the visit may have to be switched to a telephone visit. With either a video or telephone visit, we are not always able to ensure that we have a secure connection.  By engaging in this virtual visit, you consent to the provision of healthcare and authorize for your insurance to be billed (if applicable) for the services provided during this visit. Depending on your insurance coverage, you may receive a charge related to this service.  I need to obtain your verbal consent now. Are you willing to proceed with your visit today? Samantha Blackburn has provided verbal consent on 03/22/2023 for a virtual visit (video or telephone). Georgana Curio, FNP  Date: 03/22/2023 1:51 PM   Virtual Visit via Video Note   I, Georgana Curio, connected with  Samantha Blackburn  ([id number], May 10, 1980) on 03/22/23 at  1:45 PM EST by a video-enabled telemedicine application and verified that I am speaking with the correct person using two identifiers.  Location: Patient: Virtual Visit Location Patient:  Home Provider: Virtual Visit Location Provider: Home Office   I discussed the limitations of evaluation and management by telemedicine and the availability of in person appointments. The patient expressed understanding and agreed to proceed.    History of Present Illness: Samantha Blackburn is a 43 y.o. who identifies as a female who was assigned female at birth, and is being seen today for healing wound LLE, no drainage, scabbed, swelling lower into foot mild. No sign of infection. Completed doxycycline from previous visit.She is diabetic and is worried about the swelling.   HPI: HPI  Problems:  Patient Active Problem List   Diagnosis Date Noted   Heart palpitations 12/10/2022   Easy bruising 12/10/2022   Anxiety 02/26/2022   Encounter for annual general medical examination with abnormal findings in adult 11/19/2021   Hyperlipidemia LDL goal <70 05/10/2021   Neuropathy 05/10/2021   Need for Tdap vaccination 05/10/2021   Hyponatremia 05/10/2021   Renal abscess 10/12/2020   Abnormal physical evaluation 05/26/2020   Type 2 diabetes mellitus with hyperglycemia (HCC) 05/09/2020   Hypertension associated with diabetes (HCC) 05/09/2020   History of trichomoniasis 04/19/2016   Encounter to establish care 11/14/2015   Obesity 12/01/2012    Allergies:  Allergies  Allergen Reactions   Cymbalta [Duloxetine Hcl]     Can't take it   Morphine    Medications:  Current Outpatient Medications:    atorvastatin (LIPITOR) 40 MG tablet, Take 1 tablet (40 mg total) by mouth daily., Disp: 90 tablet, Rfl:  3   blood glucose meter kit and supplies, Dispense based on patient and insurance preference. Use up to four times daily as directed. (FOR ICD-10 E10.9, E11.9)., Disp: 1 each, Rfl: 11   cetirizine (ZYRTEC) 10 MG tablet, Take 1 tablet (10 mg total) by mouth daily., Disp: 30 tablet, Rfl: 11   Elastic Bandages & Supports (MEDICAL COMPRESSION STOCKINGS) MISC, 1 each by Does not apply route as directed.  Knee High Compression Stockings dx: dizziness Low pressure, Disp: 1 each, Rfl: 0   gabapentin (NEURONTIN) 600 MG tablet, Take 600 mg by mouth 3 (three) times daily., Disp: , Rfl:    hydrOXYzine (VISTARIL) 25 MG capsule, Take 1 capsule (25 mg total) by mouth every 8 (eight) hours as needed., Disp: 30 capsule, Rfl: 2   ibuprofen (ADVIL) 800 MG tablet, Take 800 mg by mouth 3 (three) times daily., Disp: , Rfl:    insulin aspart (NOVOLOG) 100 UNIT/ML FlexPen, Sliding scale TID with meals. Administer 7 units for blood sugar 70-139, 9 units for blood sugar 140-180, 11 units for blood sugar 181-240, 13 units for blood sugar 241-300, 15 units for blood sugar 301-350,  17 units for bloods sugar 351-400,  and call MD and administer 19 units if blood sugar > 400., Disp: 45 mL, Rfl: 1   insulin degludec (TRESIBA FLEXTOUCH) 100 UNIT/ML FlexTouch Pen, Inject 24 to 50 units into the skin at bedtime., Disp: 15 mL, Rfl: 2   Insulin Pen Needle (PEN NEEDLES) 33G X 4 MM MISC, 1 each by Does not apply route 4 (four) times daily - after meals and at bedtime., Disp: 300 each, Rfl: 3   metoprolol succinate (TOPROL XL) 25 MG 24 hr tablet, Take 0.5 tablets (12.5 mg total) by mouth daily., Disp: 45 tablet, Rfl: 1   norgestimate-ethinyl estradiol (ORTHO-CYCLEN) 0.25-35 MG-MCG tablet, Take 1 tablet by mouth daily. (Patient not taking: Reported on 03/22/2023), Disp: , Rfl:    olmesartan (BENICAR) 20 MG tablet, Take 1 tablet (20 mg total) by mouth daily., Disp: 90 tablet, Rfl: 1   SLYND 4 MG TABS, Take 1 tablet by mouth daily., Disp: , Rfl:    tirzepatide (MOUNJARO) 10 MG/0.5ML Pen, Inject 10 mg into the skin once a week., Disp: 6 mL, Rfl: 0  Observations/Objective: Patient is well-developed, well-nourished in no acute distress.  Resting comfortably  at home.  Head is normocephalic, atraumatic.  No labored breathing.  Speech is clear and coherent with logical content.  Patient is alert and oriented at baseline.    Assessment  and Plan: 1. Infected wound (Primary)  Keep area clean and dry. Elevate leg to decrease edema. Healing appropriately but slowly. Urgent care if appearance worsens. Swelling minimal from wound and gravity.   Follow Up Instructions: I discussed the assessment and treatment plan with the patient. The patient was provided an opportunity to ask questions and all were answered. The patient agreed with the plan and demonstrated an understanding of the instructions.  A copy of instructions were sent to the patient via MyChart unless otherwise noted below.     The patient was advised to call back or seek an in-person evaluation if the symptoms worsen or if the condition fails to improve as anticipated.    Georgana Curio, FNP

## 2023-03-22 NOTE — Patient Instructions (Addendum)
 Medication Instructions:  Your physician has recommended you make the following change in your medication:  Stop amlodipine Decrease olmesartan to 20 mg daily Start metoprolol succinate 12.5 mg daily Continue all other medications as prescribed  Labwork: none  Testing/Procedures: none  Follow-Up: Your physician recommends that you schedule a follow-up appointment in: 6-8 weeks  Any Other Special Instructions Will Be Listed Below (If Applicable). Compression Stockings prescription given today. Please take to your pharmacy of choice.   If you need a refill on your cardiac medications before your next appointment, please call your pharmacy.

## 2023-03-22 NOTE — Patient Instructions (Signed)
Laceration Care, Adult A laceration is a cut that may go through all layers of the skin and into the tissue that is right under the skin. Some lacerations heal on their own. Others need to be closed with stitches (sutures), staples, skin adhesive strips, or skin glue. Proper care of a laceration reduces the risk for infection, helps the laceration heal better, and may prevent scarring. General tips Keep the wound clean and dry. Do not scratch or pick at the wound. Wash your hands with soap and water for at least 20 seconds before and after touching your wound or changing your bandage (dressing). If soap and water are not available, use hand sanitizer. Do not usedisinfectants or antiseptics, such as rubbing alcohol, to clean your wound unless told by your health care provider. If you were given a dressing, you should change it at least once a day, or as told by your health care provider. You should also change it if it becomes wet or dirty. How to care for your laceration If sutures or staples were used: Keep the wound completely dry for the first 24 hours, or as told by your health care provider. After that time, you may shower or bathe. Do not soak your wound in water until after the sutures or staples have been removed. Clean the wound once each day, or as told by your health care provider. To do this: Wash the wound with soap and water. Rinse the wound with water to remove all soap. Pat the wound dry with a clean towel. Do not rub the wound. After cleaning the wound, apply a thin layer of antibiotic ointment, other topical ointments, or a non-adherent dressing as told by your health care provider. This will help prevent infection and keep the dressing from sticking to the wound. Have the sutures or staples removed as told by your health care provider. Do not  remove sutures or staples yourself. If skin adhesive strips were used: Do not get the skin adhesive strips wet. You may shower or bathe,  but keep the wound dry. If the wound gets wet, pat it dry with a clean towel. Do not rub the wound. Skin adhesive strips fall off on their own. If adhesive strip edges start to loosen and curl up, you may trim the loose edges. Do not remove adhesive strips completely unless your health care provider tells you to do that. If skin glue was used: You may shower or bathe, but try to keep the wound dry. Do not soak the wound in water. After showering or bathing, pat the wound dry with a clean towel. Do not rub the wound. Do not do any activities that will make you sweat a lot until the skin glue has fallen off. Do not apply liquid, cream, or ointment medicine to the wound while the skin glue is in place. Doing this may loosen the film before the wound has healed. If a dressing is placed over the wound, do not apply tape directly over the skin glue. Doing this may cause the glue to be pulled off before the wound has healed. Do not pick at the glue. Skin glue usually remains in place for 5-10 days and then falls off the skin. Follow these instructions at home: Medicines Take over-the-counter and prescription medicines only as told by your health care provider. If you were prescribed an antibiotic medicine or ointment, take or apply it as told by your health care provider. Do not stop using it even if  your condition improves. Managing pain and swelling If directed, put ice on the injured area. To do this: Put ice in a plastic bag. Place a towel between your skin and the bag. Leave the ice on for 20 minutes, 2-3 times a day. Remove the ice if your skin turns bright red. This is very important. If you cannot feel pain, heat, or cold, you have a greater risk of damage to the area. Raise (elevate) the injured area above the level of your heart while you are sitting or lying down for the first 24-48 hours after the laceration is repaired. General instructions  Avoid any activity that could cause your wound  to reopen. Check your wound every day for signs of infection. Watch for: More redness, swelling, or pain. Fluid or blood. Warmth. Pus or a bad smell. Keep all follow-up visits. This is important. Contact a health care provider if: You received a tetanus shot and you have swelling, severe pain, redness, or bleeding at the injection site. Your closed wound breaks open. You have any of these signs of infection: More redness, swelling, or pain around your wound. Fluid or blood coming from your wound. Warmth coming from your wound. Pus or a bad smell coming from your wound. A fever. You notice something coming out of the wound, such as wood or glass. Your pain is not controlled with medicine. You notice a change in the color of your skin near your wound. You need to change the dressing often. You develop a new rash. You have numbness around the wound. Get help right away if: You develop severe swelling around the wound. Your pain suddenly increases and is severe. You develop painful lumps near the wound or on skin anywhere else on your body. You have a red streak going away from your wound. The wound is on your hand or foot, and you cannot properly move a finger or toe. The wound is on your hand or foot, and you notice that your fingers or toes look pale or bluish. Summary A laceration is a cut that may go through all layers of the skin and into the tissue that is right under the skin. Some lacerations heal on their own. Others need to be closed with stitches (sutures), staples, skin adhesive strips, or skin glue. Proper care of a laceration reduces the risk of infection, helps the laceration heal better, and may prevent scarring. This information is not intended to replace advice given to you by your health care provider. Make sure you discuss any questions you have with your health care provider. Document Revised: 03/10/2020 Document Reviewed: 03/10/2020 Elsevier Patient Education   2024 [employer].

## 2023-03-30 ENCOUNTER — Emergency Department (HOSPITAL_COMMUNITY)

## 2023-03-30 ENCOUNTER — Observation Stay (HOSPITAL_COMMUNITY)
Admission: EM | Admit: 2023-03-30 | Discharge: 2023-04-01 | Disposition: A | Discharge status: Home / Self Care | Attending: Internal Medicine | Admitting: Internal Medicine

## 2023-03-30 ENCOUNTER — Other Ambulatory Visit: Payer: Self-pay

## 2023-03-30 ENCOUNTER — Encounter (HOSPITAL_COMMUNITY): Payer: Self-pay | Admitting: Emergency Medicine

## 2023-03-30 DIAGNOSIS — Z87891 Personal history of nicotine dependence: Secondary | ICD-10-CM | POA: Insufficient documentation

## 2023-03-30 DIAGNOSIS — E1165 Type 2 diabetes mellitus with hyperglycemia: Secondary | ICD-10-CM

## 2023-03-30 DIAGNOSIS — Z79899 Other long term (current) drug therapy: Secondary | ICD-10-CM | POA: Diagnosis not present

## 2023-03-30 DIAGNOSIS — R55 Syncope and collapse: Secondary | ICD-10-CM | POA: Diagnosis present

## 2023-03-30 DIAGNOSIS — R739 Hyperglycemia, unspecified: Principal | ICD-10-CM

## 2023-03-30 DIAGNOSIS — M791 Myalgia, unspecified site: Secondary | ICD-10-CM | POA: Diagnosis not present

## 2023-03-30 DIAGNOSIS — Z794 Long term (current) use of insulin: Secondary | ICD-10-CM | POA: Diagnosis not present

## 2023-03-30 DIAGNOSIS — J0140 Acute pansinusitis, unspecified: Secondary | ICD-10-CM | POA: Diagnosis not present

## 2023-03-30 DIAGNOSIS — E871 Hypo-osmolality and hyponatremia: Secondary | ICD-10-CM | POA: Diagnosis not present

## 2023-03-30 DIAGNOSIS — I951 Orthostatic hypotension: Secondary | ICD-10-CM

## 2023-03-30 DIAGNOSIS — E1140 Type 2 diabetes mellitus with diabetic neuropathy, unspecified: Secondary | ICD-10-CM | POA: Diagnosis not present

## 2023-03-30 DIAGNOSIS — N390 Urinary tract infection, site not specified: Secondary | ICD-10-CM

## 2023-03-30 DIAGNOSIS — E785 Hyperlipidemia, unspecified: Secondary | ICD-10-CM | POA: Diagnosis not present

## 2023-03-30 DIAGNOSIS — I10 Essential (primary) hypertension: Secondary | ICD-10-CM

## 2023-03-30 DIAGNOSIS — K219 Gastro-esophageal reflux disease without esophagitis: Secondary | ICD-10-CM | POA: Diagnosis not present

## 2023-03-30 DIAGNOSIS — R059 Cough, unspecified: Secondary | ICD-10-CM | POA: Insufficient documentation

## 2023-03-30 LAB — BLOOD GAS, VENOUS
Acid-base deficit: 1.1 mmol/L (ref 0.0–2.0)
Bicarbonate: 25.3 mmol/L (ref 20.0–28.0)
O2 Saturation: 34.2 %
Patient temperature: 36.8
pCO2, Ven: 48 mmHg (ref 44–60)
pH, Ven: 7.33 (ref 7.25–7.43)
pO2, Ven: <31 mmHg — CL (ref 32–45)

## 2023-03-30 LAB — RESP PANEL BY RT-PCR (RSV, FLU A&B, COVID)  RVPGX2
Influenza A by PCR: NEGATIVE
Influenza B by PCR: NEGATIVE
Resp Syncytial Virus by PCR: NEGATIVE
SARS Coronavirus 2 by RT PCR: NEGATIVE

## 2023-03-30 LAB — CBC WITH DIFFERENTIAL/PLATELET
Abs Immature Granulocytes: 0.03 10*3/uL (ref 0.00–0.07)
Basophils Absolute: 0 10*3/uL (ref 0.0–0.1)
Basophils Relative: 1 %
Eosinophils Absolute: 0.1 10*3/uL (ref 0.0–0.5)
Eosinophils Relative: 1 %
HCT: 36.7 % (ref 36.0–46.0)
Hemoglobin: 13.3 g/dL (ref 12.0–15.0)
Immature Granulocytes: 1 %
Lymphocytes Relative: 25 %
Lymphs Abs: 1.6 10*3/uL (ref 0.7–4.0)
MCH: 32 pg (ref 26.0–34.0)
MCHC: 36.2 g/dL — ABNORMAL HIGH (ref 30.0–36.0)
MCV: 88.2 fL (ref 80.0–100.0)
Monocytes Absolute: 0.5 10*3/uL (ref 0.1–1.0)
Monocytes Relative: 8 %
Neutro Abs: 4.3 10*3/uL (ref 1.7–7.7)
Neutrophils Relative %: 64 %
Platelets: 166 10*3/uL (ref 150–400)
RBC: 4.16 MIL/uL (ref 3.87–5.11)
RDW: 12.1 % (ref 11.5–15.5)
WBC: 6.6 10*3/uL (ref 4.0–10.5)
nRBC: 0 % (ref 0.0–0.2)

## 2023-03-30 LAB — TSH: TSH: 0.755 u[IU]/mL (ref 0.350–4.500)

## 2023-03-30 LAB — CBG MONITORING, ED
Glucose-Capillary: 478 mg/dL — ABNORMAL HIGH (ref 70–99)
Glucose-Capillary: 388 mg/dL — ABNORMAL HIGH (ref 70–99)
Glucose-Capillary: 266 mg/dL — ABNORMAL HIGH (ref 70–99)
Glucose-Capillary: 203 mg/dL — ABNORMAL HIGH (ref 70–99)

## 2023-03-30 LAB — URINALYSIS, ROUTINE W REFLEX MICROSCOPIC
Bilirubin Urine: NEGATIVE
Glucose, UA: >=500 mg/dL — AB
Hgb urine dipstick: NEGATIVE
Ketones, ur: 5 mg/dL — AB
Nitrite: NEGATIVE
Protein, ur: NEGATIVE mg/dL
Specific Gravity, Urine: 1.033 — ABNORMAL HIGH (ref 1.005–1.030)
WBC, UA: >50 WBC/hpf (ref 0–5)
pH: 6 (ref 5.0–8.0)

## 2023-03-30 LAB — MAGNESIUM: Magnesium: 2 mg/dL (ref 1.7–2.4)

## 2023-03-30 LAB — HCG, SERUM, QUALITATIVE: Preg, Serum: NEGATIVE

## 2023-03-30 LAB — COMPREHENSIVE METABOLIC PANEL
ALT: 21 U/L (ref 0–44)
AST: 19 U/L (ref 15–41)
Albumin: 3.2 g/dL — ABNORMAL LOW (ref 3.5–5.0)
Alkaline Phosphatase: 96 U/L (ref 38–126)
Anion gap: 12 (ref 5–15)
BUN: 11 mg/dL (ref 6–20)
CO2: 18 mmol/L — ABNORMAL LOW (ref 22–32)
Calcium: 8.7 mg/dL — ABNORMAL LOW (ref 8.9–10.3)
Chloride: 100 mmol/L (ref 98–111)
Creatinine, Ser: 0.55 mg/dL (ref 0.44–1.00)
GFR, Estimated: >60 mL/min (ref >60)
Glucose, Bld: 467 mg/dL — ABNORMAL HIGH (ref 70–99)
Potassium: 4 mmol/L (ref 3.5–5.1)
Sodium: 130 mmol/L — ABNORMAL LOW (ref 135–145)
Total Bilirubin: 0.7 mg/dL (ref 0.0–1.2)
Total Protein: 7 g/dL (ref 6.5–8.1)

## 2023-03-30 LAB — GLUCOSE, CAPILLARY
Glucose-Capillary: 213 mg/dL — ABNORMAL HIGH (ref 70–99)
Glucose-Capillary: 196 mg/dL — ABNORMAL HIGH (ref 70–99)

## 2023-03-30 LAB — BETA-HYDROXYBUTYRIC ACID: Beta-Hydroxybutyric Acid: 1.24 mmol/L — ABNORMAL HIGH (ref 0.05–0.27)

## 2023-03-30 LAB — TROPONIN I (HIGH SENSITIVITY): Troponin I (High Sensitivity): 3 ng/L (ref <18)

## 2023-03-30 MED ORDER — SODIUM CHLORIDE 0.9 % IV BOLUS
1000.0000 mL | Freq: Once | INTRAVENOUS | Status: AC
  Administered 2023-03-30: 1000 mL via INTRAVENOUS

## 2023-03-30 MED ORDER — LACTATED RINGERS IV SOLN: INTRAVENOUS | Status: DC

## 2023-03-30 MED ORDER — ORAL CARE MOUTH RINSE: 15.0000 mL | OROMUCOSAL | Status: DC | PRN

## 2023-03-30 MED ORDER — ONDANSETRON HCL 4 MG/2ML IJ SOLN
4.0000 mg | Freq: Once | INTRAMUSCULAR | Status: AC
  Administered 2023-03-30: 4 mg via INTRAVENOUS
  Filled 2023-03-30: qty 2

## 2023-03-30 MED ORDER — ATORVASTATIN CALCIUM 40 MG PO TABS
40.0000 mg | ORAL_TABLET | Freq: Every day | ORAL | Status: DC
  Administered 2023-03-30 – 2023-04-01 (×3): 40 mg via ORAL
  Filled 2023-03-30 (×3): qty 1

## 2023-03-30 MED ORDER — MECLIZINE HCL 12.5 MG PO TABS
25.0000 mg | ORAL_TABLET | Freq: Once | ORAL | Status: AC
  Administered 2023-03-30: 25 mg via ORAL
  Filled 2023-03-30: qty 2

## 2023-03-30 MED ORDER — ENOXAPARIN SODIUM 40 MG/0.4ML IJ SOSY
40.0000 mg | PREFILLED_SYRINGE | INTRAMUSCULAR | Status: DC
  Administered 2023-03-30 – 2023-03-31 (×2): 40 mg via SUBCUTANEOUS
  Filled 2023-03-30 (×2): qty 0.4

## 2023-03-30 MED ORDER — ONDANSETRON HCL 4 MG/2ML IJ SOLN: 4.0000 mg | Freq: Four times a day (QID) | INTRAMUSCULAR | Status: DC | PRN

## 2023-03-30 MED ORDER — ACETAMINOPHEN 325 MG PO TABS
650.0000 mg | ORAL_TABLET | Freq: Four times a day (QID) | ORAL | Status: DC | PRN
  Administered 2023-03-30 – 2023-03-31 (×3): 650 mg via ORAL
  Filled 2023-03-30 (×4): qty 2

## 2023-03-30 MED ORDER — INSULIN GLARGINE-YFGN 100 UNIT/ML ~~LOC~~ SOLN
12.0000 [IU] | Freq: Two times a day (BID) | SUBCUTANEOUS | Status: DC
  Administered 2023-03-30 (×2): 12 [IU] via SUBCUTANEOUS
  Filled 2023-03-30 (×5): qty 0.12

## 2023-03-30 MED ORDER — DEXTROSE IN LACTATED RINGERS 5 % IV SOLN: INTRAVENOUS | Status: DC

## 2023-03-30 MED ORDER — ACETAMINOPHEN 650 MG RE SUPP: 650.0000 mg | Freq: Four times a day (QID) | RECTAL | Status: DC | PRN

## 2023-03-30 MED ORDER — INSULIN ASPART 100 UNIT/ML IJ SOLN
10.0000 [IU] | Freq: Once | INTRAMUSCULAR | Status: AC
  Administered 2023-03-30: 10 [IU] via SUBCUTANEOUS
  Filled 2023-03-30: qty 1

## 2023-03-30 MED ORDER — ONDANSETRON HCL 4 MG PO TABS: 4.0000 mg | ORAL_TABLET | Freq: Four times a day (QID) | ORAL | Status: DC | PRN

## 2023-03-30 MED ORDER — METOPROLOL SUCCINATE ER 25 MG PO TB24
12.5000 mg | ORAL_TABLET | Freq: Every day | ORAL | Status: DC
  Administered 2023-03-30 – 2023-04-01 (×3): 12.5 mg via ORAL
  Filled 2023-03-30 (×3): qty 1

## 2023-03-30 MED ORDER — PANTOPRAZOLE SODIUM 40 MG PO TBEC
40.0000 mg | DELAYED_RELEASE_TABLET | Freq: Every day | ORAL | Status: DC
  Administered 2023-03-30 – 2023-04-01 (×3): 40 mg via ORAL
  Filled 2023-03-30 (×3): qty 1

## 2023-03-30 MED ORDER — INSULIN ASPART 100 UNIT/ML IJ SOLN
0.0000 [IU] | Freq: Every day | INTRAMUSCULAR | Status: DC
  Administered 2023-03-31: 3 [IU] via SUBCUTANEOUS

## 2023-03-30 MED ORDER — METHOCARBAMOL 500 MG PO TABS
500.0000 mg | ORAL_TABLET | Freq: Three times a day (TID) | ORAL | Status: DC | PRN
  Administered 2023-03-30 – 2023-03-31 (×3): 500 mg via ORAL
  Filled 2023-03-30 (×4): qty 1

## 2023-03-30 MED ORDER — INSULIN REGULAR(HUMAN) IN NACL 100-0.9 UT/100ML-% IV SOLN
INTRAVENOUS | Status: DC
  Administered 2023-03-30: 14 [IU]/h via INTRAVENOUS
  Filled 2023-03-30: qty 100

## 2023-03-30 MED ORDER — INSULIN ASPART 100 UNIT/ML IJ SOLN
0.0000 [IU] | Freq: Three times a day (TID) | INTRAMUSCULAR | Status: DC
  Administered 2023-03-30: 5 [IU] via SUBCUTANEOUS
  Administered 2023-03-31 (×2): 8 [IU] via SUBCUTANEOUS
  Administered 2023-03-31 – 2023-04-01 (×2): 11 [IU] via SUBCUTANEOUS

## 2023-03-30 MED ORDER — LACTATED RINGERS IV BOLUS: 1000.0000 mL | Freq: Once | INTRAVENOUS | Status: DC

## 2023-03-30 MED ORDER — SODIUM CHLORIDE 0.9 % IV SOLN: INTRAVENOUS | Status: DC

## 2023-03-30 MED ORDER — GABAPENTIN 300 MG PO CAPS
600.0000 mg | ORAL_CAPSULE | Freq: Three times a day (TID) | ORAL | Status: DC
  Administered 2023-03-30 – 2023-04-01 (×6): 600 mg via ORAL
  Filled 2023-03-30 (×6): qty 2

## 2023-03-30 MED ORDER — POTASSIUM CHLORIDE 10 MEQ/100ML IV SOLN
10.0000 meq | INTRAVENOUS | Status: AC
  Administered 2023-03-30 (×2): 10 meq via INTRAVENOUS
  Filled 2023-03-30 (×2): qty 100

## 2023-03-30 MED ORDER — SODIUM CHLORIDE 0.9 % IV SOLN
1.0000 g | Freq: Once | INTRAVENOUS | Status: AC
  Administered 2023-03-30: 1 g via INTRAVENOUS
  Filled 2023-03-30: qty 10

## 2023-03-30 MED ORDER — DEXTROSE 50 % IV SOLN: 0.0000 mL | INTRAVENOUS | Status: DC | PRN

## 2023-03-30 MED ORDER — LACTATED RINGERS IV BOLUS
500.0000 mL | Freq: Once | INTRAVENOUS | Status: AC
  Administered 2023-03-30: 500 mL via INTRAVENOUS

## 2023-03-30 MED ORDER — LIDOCAINE 5 % EX PTCH
1.0000 | MEDICATED_PATCH | CUTANEOUS | Status: DC
  Administered 2023-03-30 – 2023-03-31 (×2): 1 via TRANSDERMAL
  Filled 2023-03-30 (×2): qty 1

## 2023-03-30 NOTE — ED Notes (Signed)
 Patient transported to CT

## 2023-03-30 NOTE — ED Triage Notes (Signed)
 Pt was at work and had an unwitnessed fall/syncopal episode at work. Pt reports that she started to feel dizzy at work but doesn't remember the full event. Pt had + head strike. Pt endorses pain to head and right knee. Pt denies neck or spine pain at this time.  Pt oriented at time of triage.  Denies blood thinners Hx of diabetes   BS 478 in triage.

## 2023-03-30 NOTE — ED Notes (Signed)
 Xray at bedside,

## 2023-03-30 NOTE — ED Provider Notes (Signed)
 [place] EMERGENCY DEPARTMENT AT [facility] Provider Note   CSN: [id number] Arrival date & time: 03/30/23  0915     History  Chief Complaint  Patient presents with   Loss of Consciousness   Fall   Hyperglycemia    Samantha Blackburn is a 43 y.o. female.  Patient is a 43 year old female with a past medical history of diabetes who presents to the emergency department secondary to a syncopal/near syncopal event at work.  Patient notes that she was feeling generally unwell this morning with some associated dizziness, lightheadedness, generalized malaise and fatigue.  She notes that while she was at work she did have a fall striking her head and her right knee and is unsure if she had a true syncopal event.  Patient notes that she has had upper respiratory symptoms for the past few days and has been taking over-the-counter cold medications.  Patient also notes that her blood glucose has been elevated at home.  She denies any associated numbness, paresthesias or unilateral weakness.  She does admit to some intermittent dizziness.  She denies any abnormal headaches, pain to neck or back.  She denies any chest pain, shortness of breath or palpitations.   Loss of Consciousness Fall  Hyperglycemia Associated symptoms: syncope        Home Medications Prior to Admission medications   Medication Sig Start Date End Date Taking? Authorizing Provider  atorvastatin (LIPITOR) 40 MG tablet Take 1 tablet (40 mg total) by mouth daily. 05/10/21   Donell Beers, FNP  blood glucose meter kit and supplies Dispense based on patient and insurance preference. Use up to four times daily as directed. (FOR ICD-10 E10.9, E11.9). 05/09/20   Heather Roberts, NP  cetirizine (ZYRTEC) 10 MG tablet Take 1 tablet (10 mg total) by mouth daily. 04/05/21   Donell Beers, FNP  Elastic Bandages & Supports (MEDICAL COMPRESSION STOCKINGS) MISC 1 each by Does not apply route as directed. Knee High  Compression Stockings dx: dizziness Low pressure 03/22/23   Sharlene Dory, NP  gabapentin (NEURONTIN) 600 MG tablet Take 600 mg by mouth 3 (three) times daily.    [provider]  hydrOXYzine (VISTARIL) 25 MG capsule Take 1 capsule (25 mg total) by mouth every 8 (eight) hours as needed. 12/10/22   Billie Lade, MD  ibuprofen (ADVIL) 800 MG tablet Take 800 mg by mouth 3 (three) times daily. 05/01/21   [provider]  insulin aspart (NOVOLOG) 100 UNIT/ML FlexPen Sliding scale TID with meals. Administer 7 units for blood sugar 70-139, 9 units for blood sugar 140-180, 11 units for blood sugar 181-240, 13 units for blood sugar 241-300, 15 units for blood sugar 301-350,  17 units for bloods sugar 351-400,  and call MD and administer 19 units if blood sugar > 400. 05/10/21   Paseda, Folashade R, FNP  insulin degludec (TRESIBA FLEXTOUCH) 100 UNIT/ML FlexTouch Pen Inject 24 to 50 units into the skin at bedtime. 06/01/21   Paseda, Baird Kay, FNP  Insulin Pen Needle (PEN NEEDLES) 33G X 4 MM MISC 1 each by Does not apply route 4 (four) times daily - after meals and at bedtime. 05/09/20   Heather Roberts, NP  metoprolol succinate (TOPROL XL) 25 MG 24 hr tablet Take 0.5 tablets (12.5 mg total) by mouth daily. 03/22/23   Sharlene Dory, NP  norgestimate-ethinyl estradiol (ORTHO-CYCLEN) 0.25-35 MG-MCG tablet Take 1 tablet by mouth daily. Patient not taking: Reported on 03/22/2023  [provider]  olmesartan (BENICAR) 20 MG tablet Take 1 tablet (20 mg total) by mouth daily. 03/22/23   Sharlene Dory, NP  SLYND 4 MG TABS Take 1 tablet by mouth daily. 05/04/21   [provider]  tirzepatide Greggory Keen) 10 MG/0.5ML Pen Inject 10 mg into the skin once a week. 12/10/22   Billie Lade, MD      Allergies    Cymbalta [duloxetine hcl] and Morphine    Review of Systems   Review of Systems  Cardiovascular:  Positive for syncope.  Neurological:  Positive for syncope.  All other  systems reviewed and are negative.   Physical Exam Updated Vital Signs BP (!) 153/87 (BP Location: Right Arm)   Pulse 89   Temp 98.5 F (36.9 C) (Oral)   Resp 15   Ht 5\' 4"  (1.626 m)   Wt 65.8 kg   SpO2 98%   BMI 24.89 kg/m  Physical Exam Constitutional:      Appearance: Normal appearance.  HENT:     Head: Normocephalic and atraumatic.     Nose: Nose normal.     Mouth/Throat:     Mouth: Mucous membranes are moist.  Eyes:     Extraocular Movements: Extraocular movements intact.     Conjunctiva/sclera: Conjunctivae normal.     Pupils: Pupils are equal, round, and reactive to light.  Neck:     Comments: Nontender palpation over cervical spine, no step-off or deformity, full range of motion without midline tenderness Cardiovascular:     Rate and Rhythm: Normal rate and regular rhythm.     Pulses: Normal pulses.     Heart sounds: Normal heart sounds. No murmur heard. Pulmonary:     Effort: Pulmonary effort is normal. No respiratory distress.     Breath sounds: Normal breath sounds. No stridor. No wheezing, rhonchi or rales.  Abdominal:     General: Abdomen is flat. Bowel sounds are normal. There is no distension.     Palpations: Abdomen is soft.     Tenderness: There is no abdominal tenderness. There is no guarding.  Musculoskeletal:        General: Normal range of motion.     Cervical back: Normal range of motion and neck supple. No rigidity or tenderness.     Comments: Tenderness palpation over the anterior aspect of the right knee, nontender palpation of remainder bilateral lower extremities, full range of motion noted throughout, abrasion noted over the anterior knee on the right, no obvious deformity or bruising, no skin breakdown or ulceration, no lacerations or abrasions, DP and PT pulses are 2+ distally, sensation intact distally Nontender palpation over bilateral upper extremities, full range of motion noted to bilateral upper extremities, radial pulse 2+ distally,  strength 5 out of 5 in bilateral upper and lower extremities Nontender palpation over thoracic and lumbar spine, no step-off or deformity  Skin:    General: Skin is warm and dry.     Findings: No bruising or rash.  Neurological:     General: No focal deficit present.     Mental Status: She is alert and oriented to person, place, and time. Mental status is at baseline.     Cranial Nerves: No cranial nerve deficit.     Sensory: No sensory deficit.     Motor: No weakness.     Coordination: Coordination normal.     Comments: Finger-nose intact, normal rapid alternating movements, horizontal nystagmus noted to the right  Psychiatric:  Mood and Affect: Mood normal.        Behavior: Behavior normal.        Thought Content: Thought content normal.        Judgment: Judgment normal.     ED Results / Procedures / Treatments   Labs (all labs ordered are listed, but only abnormal results are displayed) Labs Reviewed  CBC WITH DIFFERENTIAL/PLATELET - Abnormal; Notable for the following components:      Result Value   MCHC 36.2 (*)    All other components within normal limits  CBG MONITORING, ED - Abnormal; Notable for the following components:   Glucose-Capillary 478 (*)    All other components within normal limits  RESP PANEL BY RT-PCR (RSV, FLU A&B, COVID)  [id number]  COMPREHENSIVE METABOLIC PANEL  URINALYSIS, ROUTINE W REFLEX MICROSCOPIC  BETA-HYDROXYBUTYRIC ACID  HCG, SERUM, QUALITATIVE  TROPONIN I (HIGH SENSITIVITY)    EKG None  Radiology No results found.  Procedures Procedures    Medications Ordered in ED Medications  meclizine (ANTIVERT) tablet 25 mg (has no administration in time range)  ondansetron (ZOFRAN) injection 4 mg (has no administration in time range)  sodium chloride 0.9 % bolus 1,000 mL (1,000 mLs Intravenous New Bag/Given 03/30/23 0935)    ED Course/ Medical Decision Making/ A&P                                 Medical Decision Making Amount  and/or Complexity of Data Reviewed Labs: ordered. Radiology: ordered.  Risk Prescription drug management. Decision regarding hospitalization.   This patient presents to the ED for concern of syncopal events, dizziness, cough, congestion, rhinorrhea, this involves an extensive number of treatment options, and is a complaint that carries with it a high risk of complications and morbidity.  The differential diagnosis includes DKA, HHS, hyperglycemia, dehydration, urinary tract infection, pyelonephritis, kidney stone, CVA, TIA, vasovagal syncope   Co morbidities that complicate the patient evaluation  Diabetes   Additional history obtained:  Additional history obtained from none External records from outside source obtained and reviewed including none   Lab Tests:  I Ordered, and personally interpreted labs.  The pertinent results include: Elevated beta hydroxybutyrate, elevated glucose, low bicarb, normal anion gap, negative viral swabs, no leukocytosis, leukocytes within the urine   Imaging Studies ordered:  I ordered imaging studies including chest x-ray, x-ray of right knee, CT scan of the head I independently visualized and interpreted imaging which showed no acute cardiopulmonary process, no acute osseous injury of the knee, no acute intracranial hemorrhage I agree with the radiologist interpretation   Cardiac Monitoring: / EKG:  The patient was maintained on a cardiac monitor.  I personally viewed and interpreted the cardiac monitored which showed an underlying rhythm of: Normal sinus rhythm, no ST/T wave changes, no ischemic changes, no STEMI   Consultations Obtained:  I requested consultation with the hospitalist,  and discussed lab and imaging findings as well as pertinent plan - they recommend: Admission   Problem List / ED Course / Critical interventions / Medication management  Patient is doing well at this time and does remain stable.  Discussed with patient  we will plan for admission to the hospital service given her dehydration, hyperglycemia, and syncopal event.  Patient does have a low bicarb and elevated beta hydroxybutyrate but is not currently acidotic.  Anion gap is within normal limits.  CT scan of the head demonstrated no acute  intracranial process.  Do not suspect CVA or TIA as patient has no focal deficits on exam.  X-ray of the chest was unremarkable and x-ray of the right knee demonstrated no acute osseous injury.  Did discuss patient case with Dr. Gwenlyn Perking who has admitted for observation at this time. I ordered medication including IV fluids, insulin, Rocephin for dehydration, hyperglycemia, urinary tract infection, sinusitis Reevaluation of the patient after these medicines showed that the patient improved I have reviewed the patients home medicines and have made adjustments as needed   Social Determinants of Health:  None   Test / Admission - Considered:  Admission        Final Clinical Impression(s) / ED Diagnoses Final diagnoses:  None    Rx / DC Orders ED Discharge Orders     None         Kathlen Mody 03/30/23 1313    Bethann Berkshire, MD 03/31/23 [phone]

## 2023-03-30 NOTE — H&P (Signed)
 History and Physical    Patient: Samantha Blackburn DOB: [birth year] DOA: 03/30/2023 DOS: the patient was seen and examined on 03/30/2023 PCP: Billie Lade, MD  Patient coming from: Home  Chief Complaint:  Chief Complaint  Patient presents with   Loss of Consciousness   Fall   Hyperglycemia   HPI: Samantha Blackburn is a 43 y.o. female with medical history significant of type 2 diabetes, diabetic neuropathy, gastroesophageal reflux disease, hyperlipidemia and hypertension; who presented to the hospital secondary to syncope and collapse.  Patient reports being at work feeling lightheaded and She knows she ended up passing out.  In the process of falling patient ended up striking her head; no open wounds appreciated. Patient expressed no fever, no chest pain, no palpitations, no nausea, no vomiting, no dysuria, no focal weaknesses or sick contacts.  On further questioning she has expressed 3-4 days of feeling unwell, noticing increased blood sugar levels in her meters and also experiencing increased fatigue and polyuria.  Workup in the ED demonstrating CT scan of the head without acute intracranial abnormalities; normal WBCs, patient was afebrile and hemodynamically stable.  EKG/telemetry not demonstrating any significant arrhythmia.  Urinalysis positive for leukocytes esterase and increased specific gravity.  Patient was found to be with elevated blood sugar and borderline metabolic acidosis with a bicarb of 18.  Anion gap was closed.  IV fluids initiated and insulin given.  TR[facility] contacted to place patient in the hospital for further evaluation and management.   Review of Systems: As mentioned in the history of present illness. All other systems reviewed and are negative. Past Medical History:  Diagnosis Date   Diabetes mellitus without complication (HCC)    Diabetes mellitus, new onset (HCC) 11/29/2012   Obesity 12/01/2012   Trichimoniasis    Past Surgical History:   Procedure Laterality Date   CHOLECYSTECTOMY     Social History:  reports that she quit smoking about 12 years ago. Her smoking use included cigarettes. She started smoking about 22 years ago. She has a 10 pack-year smoking history. She has never used smokeless tobacco. She reports that she does not drink alcohol and does not use drugs.  Allergies  Allergen Reactions   Cymbalta [Duloxetine Hcl]     Can't take it   Morphine     Family History  Problem Relation Age of Onset   Hypertension Mother    Diabetes Mother    Cancer Father        lymphoma, kidney,bladder, liver,prostate   Diabetes Father    Heart attack Father    Diabetes Maternal Grandmother    Heart attack Maternal Grandfather    Stroke Paternal Grandmother     Prior to Admission medications   Medication Sig Start Date End Date Taking? Authorizing Provider  atorvastatin (LIPITOR) 40 MG tablet Take 1 tablet (40 mg total) by mouth daily. 05/10/21   Donell Beers, FNP  blood glucose meter kit and supplies Dispense based on patient and insurance preference. Use up to four times daily as directed. (FOR ICD-10 E10.9, E11.9). 05/09/20   Heather Roberts, NP  cetirizine (ZYRTEC) 10 MG tablet Take 1 tablet (10 mg total) by mouth daily. 04/05/21   Donell Beers, FNP  Elastic Bandages & Supports (MEDICAL COMPRESSION STOCKINGS) MISC 1 each by Does not apply route as directed. Knee High Compression Stockings dx: dizziness Low pressure 03/22/23   Sharlene Dory, NP  gabapentin (NEURONTIN) 600 MG tablet Take 600 mg by mouth 3 (three)  times daily.    [provider]  hydrOXYzine (VISTARIL) 25 MG capsule Take 1 capsule (25 mg total) by mouth every 8 (eight) hours as needed. 12/10/22   Billie Lade, MD  ibuprofen (ADVIL) 800 MG tablet Take 800 mg by mouth 3 (three) times daily. 05/01/21   [provider]  insulin aspart (NOVOLOG) 100 UNIT/ML FlexPen Sliding scale TID with meals. Administer 7 units for blood sugar  70-139, 9 units for blood sugar 140-180, 11 units for blood sugar 181-240, 13 units for blood sugar 241-300, 15 units for blood sugar 301-350,  17 units for bloods sugar 351-400,  and call MD and administer 19 units if blood sugar > 400. 05/10/21   Paseda, Folashade R, FNP  insulin degludec (TRESIBA FLEXTOUCH) 100 UNIT/ML FlexTouch Pen Inject 24 to 50 units into the skin at bedtime. 06/01/21   Paseda, Baird Kay, FNP  Insulin Pen Needle (PEN NEEDLES) 33G X 4 MM MISC 1 each by Does not apply route 4 (four) times daily - after meals and at bedtime. 05/09/20   Heather Roberts, NP  metoprolol succinate (TOPROL XL) 25 MG 24 hr tablet Take 0.5 tablets (12.5 mg total) by mouth daily. 03/22/23   Sharlene Dory, NP  olmesartan (BENICAR) 20 MG tablet Take 1 tablet (20 mg total) by mouth daily. 03/22/23   Sharlene Dory, NP  SLYND 4 MG TABS Take 1 tablet by mouth daily. 05/04/21   [provider]  tirzepatide Greggory Keen) 10 MG/0.5ML Pen Inject 10 mg into the skin once a week. 12/10/22   Billie Lade, MD    Physical Exam: Vitals:   03/30/23 1215 03/30/23 1230 03/30/23 1248 03/30/23 1300  BP:      Pulse: 88 90 88 91  Resp: 14 12 14 12   Temp:      TempSrc:      SpO2: 98% 99% 98% 96%  Weight:      Height:       General exam: Alert, awake, oriented x 3; no chest pain, no nausea, no vomiting, no shortness of breath. Respiratory system: Good saturation on room air; no using accessory muscle. Cardiovascular system:RRR. No murmurs, rubs or gallops; no JVD. Gastrointestinal system: Abdomen is nondistended, soft and nontender. No organomegaly or masses felt. Normal bowel sounds heard. Central nervous system: Moving 4 limbs spontaneously.  No focal neurological deficits. Extremities: No cyanosis or clubbing; left lower extremity with ongoing chronic wound without active drainage or separation. Skin: No petechiae. Psychiatry: Judgement and insight appear normal. Mood & affect appropriate.   Data  Reviewed: Comprehensive metabolic panel: Sodium 130, potassium 4.0, chloride 100, bicarb 18, glucose 467, BUN 11, creatinine 0.55; normal LFTs and GFR > 60 Troponin: 3  CBC: White blood cell 6.6, hemoglobin 13.3 and platelet count 166K Respiratory panel: Negative for COVID, influenza and RSV. Magnesium: 2.0 Urinalysis: Turbid appearance with a specific gravity 1.033 and more than 500 glucose.  Negative ketones, negative nitrite, negative protein, large leukocyte esterase.   Assessment and Plan: 1-syncope and collapse -Appears to be driven by orthostatic hypotension and dehydration in the setting of hyperglycemia -Will provide fluid resuscitation -Sliding scale insulin and long-acting will be provided -Modified carbohydrate diet will be ordered -Follow CBG fluctuation -Repeat orthostatic vital signs in AM. -Will check TSH in morning to be thorough and if any abnormalities in patient's breathing appreciated while been on telemetry will check a 2D echo.  2-hypertension -Holding patient's Benicar overnight while providing fluid resuscitation -Follow vital signs. -Chronic metoprolol  3-type 2 diabetes with hyperglycemia -Update A1c -As mentioned above for sliding scale insulin and long-acting will be provided -Modified [place] diet discussed with patient -Follow CBG fluctuation.  4-hyponatremia/pseudohyponatremia -In the setting of hyperglycemia most likely -Will provide fluid resuscitation, control blood sugar levels and follow electrolytes trend.  5-diabetic neuropathy -Continue treatment with Neurontin  6-hyperlipidemia -Continue statin  7-GERD -Continue PPI  8-muscular pain after fall -As needed analgesics, Lidoderm patch and muscle relaxants will be provided -Follow clinical response.   Advance Care Planning:   Code Status: Full Code   Consults: None  Family Communication: Husband and sons at bedside  Severity of Illness: The appropriate patient status for this  patient is OBSERVATION. Observation status is judged to be reasonable and necessary in order to provide the required intensity of service to ensure the patient's safety. The patient's presenting symptoms, physical exam findings, and initial radiographic and laboratory data in the context of their medical condition is felt to place them at decreased risk for further clinical deterioration. Furthermore, it is anticipated that the patient will be medically stable for discharge from the hospital within 2 midnights of admission.   Author: Vassie Loll, MD 03/30/2023 1:01 PM  For on call review [email or web address].

## 2023-03-30 NOTE — ED Notes (Signed)
 Family updated as to patient's status.

## 2023-03-30 NOTE — ED Notes (Signed)
Dr Madera at bedside  

## 2023-03-30 NOTE — ED Notes (Signed)
 Pt ambulated to bathroom with 1x assist. Pt ambulated with steady and even gait. Pt reports feeling slightly lightheaded that subsided the longer she was ambulatory.

## 2023-03-30 NOTE — ED Notes (Signed)
 PA at bedside.

## 2023-03-31 DIAGNOSIS — Z794 Long term (current) use of insulin: Secondary | ICD-10-CM | POA: Diagnosis not present

## 2023-03-31 DIAGNOSIS — I951 Orthostatic hypotension: Secondary | ICD-10-CM

## 2023-03-31 DIAGNOSIS — E1165 Type 2 diabetes mellitus with hyperglycemia: Secondary | ICD-10-CM | POA: Diagnosis not present

## 2023-03-31 LAB — GLUCOSE, CAPILLARY
Glucose-Capillary: 288 mg/dL — ABNORMAL HIGH (ref 70–99)
Glucose-Capillary: 258 mg/dL — ABNORMAL HIGH (ref 70–99)
Glucose-Capillary: 320 mg/dL — ABNORMAL HIGH (ref 70–99)
Glucose-Capillary: 280 mg/dL — ABNORMAL HIGH (ref 70–99)

## 2023-03-31 LAB — HIV ANTIBODY (ROUTINE TESTING W REFLEX): HIV Screen 4th Generation wRfx: NONREACTIVE

## 2023-03-31 LAB — CORTISOL: Cortisol, Plasma: 10.6 ug/dL

## 2023-03-31 LAB — BASIC METABOLIC PANEL
Anion gap: 7 (ref 5–15)
BUN: 9 mg/dL (ref 6–20)
CO2: 20 mmol/L — ABNORMAL LOW (ref 22–32)
Calcium: 7.9 mg/dL — ABNORMAL LOW (ref 8.9–10.3)
Chloride: 108 mmol/L (ref 98–111)
Creatinine, Ser: 0.52 mg/dL (ref 0.44–1.00)
GFR, Estimated: >60 mL/min (ref >60)
Glucose, Bld: 225 mg/dL — ABNORMAL HIGH (ref 70–99)
Potassium: 3.5 mmol/L (ref 3.5–5.1)
Sodium: 135 mmol/L (ref 135–145)

## 2023-03-31 LAB — HEMOGLOBIN A1C
Hgb A1c MFr Bld: 15.7 % — ABNORMAL HIGH (ref 4.8–5.6)
Mean Plasma Glucose: 403.89 mg/dL

## 2023-03-31 MED ORDER — INSULIN GLARGINE-YFGN 100 UNIT/ML ~~LOC~~ SOLN
18.0000 [IU] | Freq: Two times a day (BID) | SUBCUTANEOUS | Status: DC
Start: 2023-03-31 — End: 2023-04-01
  Administered 2023-03-31 (×2): 18 [IU] via SUBCUTANEOUS
  Filled 2023-03-31 (×5): qty 0.18

## 2023-03-31 NOTE — Progress Notes (Signed)
   03/31/23 0530  Vitals  Temp 97.8 F (36.6 C)  Temp Source Oral  BP (!) 107/53  MAP (mmHg) 70  BP Location Right Arm  BP Method Automatic  Patient Position (if appropriate) Orthostatic Vitals  Pulse Rate 90  Pulse Rate Source Monitor  Resp 14  MEWS COLOR  MEWS Score Color Green  Orthostatic Lying   BP- Lying 107/53  Pulse- Lying 90  Orthostatic Sitting  BP- Sitting 109/72  Pulse- Sitting 89  Orthostatic Standing at 0 minutes  BP- Standing at 0 minutes 95/59  Pulse- Standing at 0 minutes 91  Orthostatic Standing at 3 minutes  BP- Standing at 3 minutes (!) 89/57  Pulse- Standing at 3 minutes 91  Oxygen Therapy  SpO2 98 %  O2 Device Room Air  MEWS Score  MEWS Temp 0  MEWS Systolic 0  MEWS Pulse 0  MEWS RR 0  MEWS LOC 0  MEWS Score 0

## 2023-03-31 NOTE — Inpatient Diabetes Management (Signed)
 Inpatient Diabetes Program Recommendations  AACE/ADA: New Consensus Statement on Inpatient Glycemic Control (2015)  Target Ranges:  Prepandial:   less than 140 mg/dL      Peak postprandial:   less than 180 mg/dL (1-2 hours)      Critically ill patients:  140 - 180 mg/dL   Lab Results  Component Value Date   GLUCAP 288 (H) 03/31/2023   HGBA1C 12.5 (H) 12/10/2022   Inpatient Diabetes Program Recommendations:    Discharge Recommendations: Long acting recommendations: Insulin Degludec (TRESIBA) FlexTouch Pen 30 units QHS  Short acting recommendations:  Meal + Correction coverage Insulin aspart (NOVOLOG) FlexPen  Sensitive Scale.  4 units + correciton Supply/Referral recommendations: Referral to Nutrition & Diab Services   Use Adult Diabetes Insulin Treatment Post Discharge order set.  Thanks, Lujean Rave, MSN, RNC-OB Diabetes Coordinator [phone] (8a-5p)

## 2023-03-31 NOTE — Inpatient Diabetes Management (Addendum)
 Inpatient Diabetes Program Recommendations  AACE/ADA: New Consensus Statement on Inpatient Glycemic Control (2015)  Target Ranges:  Prepandial:   less than 140 mg/dL      Peak postprandial:   less than 180 mg/dL (1-2 hours)      Critically ill patients:  140 - 180 mg/dL   Lab Results  Component Value Date   GLUCAP 288 (H) 03/31/2023   HGBA1C 12.5 (H) 12/10/2022    Review of Glycemic Control  Latest Reference Range & Units 03/30/23 16:34 03/30/23 21:31 03/31/23 07:38  Glucose-Capillary 70 - 99 mg/dL 272 (H) 536 (H) 644 (H)  (H): Data is abnormally high Diabetes history: Type 2 DM Outpatient Diabetes medications: Tresiba 24-50 units at bedtime, Novolog 10 units at bedtime, Mounjaro 10 mg qwk Current orders for Inpatient glycemic control: Novolog 0-15 units TID & HS, Semglee 12 units BID, Novolog 10 units x 1  Inpatient Diabetes Program Recommendations:     Consider increasing Semglee 15 units BID And adding Novolog 4 units TID (Assuming patient is consuming >50% of meals) Secure chat sent to md.  Secure chat sent to LPN to assist in reaching patient. Attempted to call via phone x 2.  Will continue to have DM team attempt.   Addendum: Spoke with patient regarding outpatient diabetes management. Patient verifies medications above, however, self adjusts insulin daily. Reviewed patient's previous A1c of 12.5%, with current pending. Explained what a A1c is and what it measures. Also reviewed goal A1c with patient, importance of good glucose control @ home, and blood sugar goals. Reviewed patho of DM, need for improved control, risk for infection, signs and symptoms, interventions, survival skills, CGM, role of endocrinology, need for more consistent dosing, vascular changes and commorbidities.  Patient has a meter and uses infrequently. Discussed recommended frequency for checking CBGs and when to call Md. Interested in CGM. Discussed risks and benefits. Plans to make appointment with PCP  this week to obtain sensors.  Admits to drinking sugary beverages. Discussed alternatives to sugary beverages, importance of protein and mindfulness of CHO intake.  Will attach endo list to DC summary. Dietitian consult and RPH ordered.   Thanks, Lujean Rave, MSN, RNC-OB Diabetes Coordinator [phone] (8a-5p)

## 2023-03-31 NOTE — Progress Notes (Signed)
 Progress Note   Patient: Samantha Blackburn DOB: 11-01-1980 DOA: 03/30/2023     0 DOS: the patient was seen and examined on 03/31/2023   Brief hospital mission narrative: Samantha Blackburn is a 43 y.o. female with medical history significant of type 2 diabetes, diabetic neuropathy, gastroesophageal reflux disease, hyperlipidemia and hypertension; who presented to the hospital secondary to syncope and collapse.  Patient reports being at work feeling lightheaded and She knows she ended up passing out.  In the process of falling patient ended up striking her head; no open wounds appreciated. Patient expressed no fever, no chest pain, no palpitations, no nausea, no vomiting, no dysuria, no focal weaknesses or sick contacts.   On further questioning she has expressed 3-4 days of feeling unwell, noticing increased blood sugar levels in her meters and also experiencing increased fatigue and polyuria.   Workup in the ED demonstrating CT scan of the head without acute intracranial abnormalities; normal WBCs, patient was afebrile and hemodynamically stable.  EKG/telemetry not demonstrating any significant arrhythmia.  Urinalysis positive for leukocytes esterase and increased specific gravity.  Patient was found to be with elevated blood sugar and borderline metabolic acidosis with a bicarb of 18.  Anion gap was closed.  IV fluids initiated and insulin given.  TRH contacted to place patient in the hospital for further evaluation and management.  Assessment and Plan: 1-syncope and collapse -Associated with dehydration and orthostasis -Patient received fluid resuscitation and blood sugar levels has been further control. -Still orthostatic this morning -Continue IV fluid -Checking cortisol level -TSH within normal limits. -She is overall feeling better and reports no lightheadedness today.  2-hypertension -Continue to hold Benicar at the moment -Continue metoprolol -Follow vital  signs.  3-uncontrolled type 2 diabetes mellitus with hyperglycemia -Continue adjusted dose of Semglee -Continue moderate sliding scale insulin and follow CBG fluctuation -Modified carbohydrate diet discussed with patient. -A1c 15.7  4-hyponatremia/pseudohyponatremia -Resolved after fluid resuscitation and further control of patient's CBGs -Continue to follow electrolytes trend.  5-diabetic neuropathy -Continue Neurontin  6-hyperlipidemia -Continue statin  7-gastroesophageal disease -Continue PPI.  8-muscular pain -Continue as needed analgesics -Patient reports feeling better and not having significant discomfort at this time.  Subjective:  Afebrile, no chest pain, no nausea vomiting.  Positive orthostasis appreciated -Overall improvement and reporting no lightheadedness.  Denies dysuria or hematuria.  Physical Exam: Vitals:   03/31/23 0532 03/31/23 0536 03/31/23 0903 03/31/23 1302  BP: (!) 95/59 (!) 89/57 121/79 107/77  Pulse: 91 91 89 99  Resp:      Temp:    98.1 F (36.7 C)  TempSrc:    Oral  SpO2: 98% 98%  99%  Weight:      Height:       General exam: Alert, awake, oriented x 3 Respiratory system: Clear to auscultation. Respiratory effort normal. Cardiovascular system:RRR. No murmurs, rubs, gallops. Gastrointestinal system: Abdomen is nondistended, soft and nontender. No organomegaly or masses felt. Normal bowel sounds heard. Central nervous system: Alert and oriented. No focal neurological deficits. Extremities: No cyanosis, clubbing or edema. Skin: No petechiae.;  Chronic wound adequately healing injury of left lower leg.  (Present at time of admission; no drainage or signs of superimposed infection). Psychiatry: Judgement and insight appear normal. Mood & affect appropriate.   Data Reviewed: Basic metabolic panel: Sodium 135, potassium 3.5, chloride 108, bicarb 20, BUN 9, creatinine 0.52 and GFR >60 TSH: 0.755 A1c: 15.7  Family Communication:  Husband at  bedside.  Disposition: Status is: Observation  The patient remains OBS appropriate and will d/c before 2 midnights.   Planned Discharge Destination: Home  Time spent: 50 minutes  Author: Vassie Loll, MD 03/31/2023 4:53 PM  For on call review [email or web address].

## 2023-03-31 NOTE — Discharge Instructions (Addendum)
 [facility] [phone]) Dr. Carlus Pavlov Dr. Reather Littler [facility] [phone]) Dr. Talmage Coin [facility] [phone]) Dr. Dorisann Frames Dr. Darci Needle [facility] [phone]) Dr. Deirdre Pippins [facility] [phone]) [[place] office]  [phone]) Dan Humphreys office] Dr. Wendall Mola Dr. Verdis Frederickson [facility]) [phone]) Autumn Pearletha [place]), [place] Dr. Izell [place] Dr. Jillyn Ledger. [facility] [phone]) Dr. Marquis Lunch [facility] [phone]) Dr. Jerelyn Scott Dr. Dessa Phi Dr. Gerome Sam, FNP Dr. Girtha Hake. [place] in [place] [phone])   Carbohydrate Counting For People With Diabetes  Foods with carbohydrates make your blood glucose level go up. Learning how to count carbohydrates can help you control your blood glucose levels. First, identify the foods you eat that contain carbohydrates. Then, using the Foods with Carbohydrates chart, determine about how much carbohydrates are in your meals and snacks. Make sure you are eating foods with fiber, protein, and healthy fat along with your carbohydrate foods. Foods with Carbohydrates The following table shows carbohydrate foods that have about 15 grams of carbohydrate each. Using measuring cups, spoons, or a food scale when you first begin learning about carbohydrate counting can help you learn about the portion sizes you typically eat. The following foods have 15 grams carbohydrate each:  Grains 1 slice bread (1 ounce)  1 small tortilla (6-inch size)   large bagel (1 ounce)  1/3 cup pasta or rice (cooked)   hamburger or hot dog bun ( ounce)   cup cooked cereal   to  cup ready-to-eat cereal  2 taco shells (5-inch size) Fruit 1 small fresh fruit ( to  1 cup)   medium banana  17 small grapes (3 ounces)  1 cup melon or berries   cup canned or frozen fruit  2 tablespoons dried fruit (blueberries, cherries, cranberries, raisins)   cup unsweetened fruit juice  Starchy Vegetables  cup cooked beans, peas, corn, potatoes/sweet potatoes   large baked potato (3 ounces)  1 cup acorn or butternut squash  Snack Foods 3 to 6 crackers  8 potato chips or 13 tortilla chips ( ounce to 1 ounce)  3 cups popped popcorn  Dairy 3/4 cup (6 ounces) nonfat plain yogurt, or yogurt with sugar-free sweetener  1 cup milk  1 cup plain rice, soy, coconut or flavored almond milk Sweets and Desserts  cup ice cream or frozen yogurt  1 tablespoon jam, jelly, pancake syrup, table sugar, or honey  2 tablespoons light pancake syrup  1 inch square of frosted cake or 2 inch square of unfrosted cake  2 small cookies (2/3 ounce each) or  large cookie  Sometimes you'll have to estimate carbohydrate amounts if you don't know the exact recipe. One cup of mixed foods like soups can have 1 to 2 carbohydrate servings, while some casseroles might have 2 or more servings of carbohydrate. Foods that have less than 20 calories in each serving can be counted as "free" foods. Count 1 cup raw vegetables, or  cup cooked non-starchy vegetables as "free" foods. If you eat 3 or more servings at one meal, then count them as 1 carbohydrate serving.  Foods without Carbohydrates  Not all foods contain carbohydrates. Meat, some dairy, fats, non-starchy vegetables, and many beverages don't contain carbohydrate. So when you count carbohydrates, you can generally exclude chicken, pork, beef, fish, seafood, eggs, tofu, cheese, butter, sour cream, avocado, nuts, seeds, olives, mayonnaise, water,  black coffee, unsweetened tea, and zero-calorie drinks. Vegetables with no or low carbohydrate include green beans, cauliflower, tomatoes, and onions. How much carbohydrate should I eat at each meal?   Carbohydrate counting can help you plan your meals and manage your weight. Following are some starting points for carbohydrate intake at each meal. Work with your registered dietitian nutritionist to find the best range that works for your blood glucose and weight.   To Lose Weight To Maintain Weight  Women 2 - 3 carb servings 3 - 4 carb servings  Men 3 - 4 carb servings 4 - 5 carb servings  Checking your blood glucose after meals will help you know if you need to adjust the timing, type, or number of carbohydrate servings in your meal plan. Achieve and keep a healthy body weight by balancing your food intake and physical activity.  Tips How should I plan my meals?  Plan for half the food on your plate to include non-starchy vegetables, like salad greens, broccoli, or carrots. Try to eat 3 to 5 servings of non-starchy vegetables every day. Have a protein food at each meal. Protein foods include chicken, fish, meat, eggs, or beans (note that beans contain carbohydrate). These two food groups (non-starchy vegetables and proteins) are low in carbohydrate. If you fill up your plate with these foods, you will eat less carbohydrate but still fill up your stomach. Try to limit your carbohydrate portion to  of the plate.  What fats are healthiest to eat?  Diabetes increases risk for heart disease. To help protect your heart, eat more healthy fats, such as olive oil, nuts, and avocado. Eat less saturated fats like butter, cream, and high-fat meats, like bacon and sausage. Avoid trans fats, which are in all foods that list "partially hydrogenated oil" as an ingredient. What should I drink?  Choose drinks that are not sweetened with sugar. The healthiest choices are water, carbonated or seltzer waters, and tea and coffee without added sugars.  Sweet drinks will make your blood glucose go up very quickly. One serving of soda or energy drink is  cup. It is best to drink these beverages only if your blood glucose  is low.  Artificially sweetened, or diet drinks, typically do not increase your blood glucose if they have zero calories in them. Read labels of beverages, as some diet drinks do have carbohydrate and will raise your blood glucose. Label Reading Tips Read Nutrition Facts labels to find out how many grams of carbohydrate are in a food you want to eat. Don't forget: sometimes serving sizes on the label aren't the same as how much food you are going to eat, so you may need to calculate how much carbohydrate is in the food you are serving yourself.   Carbohydrate Counting for People with Diabetes Sample 1-Day Menu  Breakfast  cup yogurt, low fat, low sugar (1 carbohydrate serving)   cup cereal, ready-to-eat, unsweetened (1 carbohydrate serving)  1 cup strawberries (1 carbohydrate serving)   cup almonds ( carbohydrate serving)  Lunch 1, 5 ounce can chunk light tuna  2 ounces cheese, low fat cheddar  6 whole wheat crackers (1 carbohydrate serving)  1 small apple (1 carbohydrate servings)   cup carrots ( carbohydrate serving)   cup snap peas  1 cup 1% milk (1 carbohydrate serving)   Evening Meal Stir fry made with: 3 ounces chicken  1 cup brown rice (3 carbohydrate servings)   cup broccoli ( carbohydrate serving)   cup  green beans   cup onions  1 tablespoon olive oil  2 tablespoons teriyaki sauce ( carbohydrate serving)  Evening Snack 1 extra small banana (1 carbohydrate serving)  1 tablespoon peanut butter   Carbohydrate Counting for People with Diabetes Vegan Sample 1-Day Menu  Breakfast 1 cup cooked oatmeal (2 carbohydrate servings)   cup blueberries (1 carbohydrate serving)  2 tablespoons flaxseeds  1 cup soymilk fortified with calcium and vitamin D  1 cup coffee  Lunch 2 slices whole wheat bread (2 carbohydrate servings)   cup baked tofu   cup lettuce  2 slices tomato  2 slices avocado   cup baby carrots ( carbohydrate serving)  1 orange (1 carbohydrate serving)   1 cup soymilk fortified with calcium and vitamin D   Evening Meal Burrito made with: 1 6-inch corn tortilla (1 carbohydrate serving)  1 cup refried vegetarian beans (2 carbohydrate servings)   cup chopped tomatoes   cup lettuce   cup salsa  1/3 cup brown rice (1 carbohydrate serving)  1 tablespoon olive oil for rice   cup zucchini   Evening Snack 6 small whole grain crackers (1 carbohydrate serving)  2 apricots ( carbohydrate serving)   cup unsalted peanuts ( carbohydrate serving)    Carbohydrate Counting for People with Diabetes Vegetarian (Lacto-Ovo) Sample 1-Day Menu  Breakfast 1 cup cooked oatmeal (2 carbohydrate servings)   cup blueberries (1 carbohydrate serving)  2 tablespoons flaxseeds  1 egg  1 cup 1% milk (1 carbohydrate serving)  1 cup coffee  Lunch 2 slices whole wheat bread (2 carbohydrate servings)  2 ounces low-fat cheese   cup lettuce  2 slices tomato  2 slices avocado   cup baby carrots ( carbohydrate serving)  1 orange (1 carbohydrate serving)  1 cup unsweetened tea  Evening Meal Burrito made with: 1 6-inch corn tortilla (1 carbohydrate serving)   cup refried vegetarian beans (1 carbohydrate serving)   cup tomatoes   cup lettuce   cup salsa  1/3 cup brown rice (1 carbohydrate serving)  1 tablespoon olive oil for rice   cup zucchini  1 cup 1% milk (1 carbohydrate serving)  Evening Snack 6 small whole grain crackers (1 carbohydrate serving)  2 apricots ( carbohydrate serving)   cup unsalted peanuts ( carbohydrate serving)    Copyright 2020  Academy of Nutrition and Dietetics. All rights reserved.  Using Nutrition Labels: Carbohydrate  Serving Size  Look at the serving size. All the information on the label is based on this portion. Servings Per Container  The number of servings contained in the package. Guidelines for Carbohydrate  Look at the total grams of carbohydrate in the serving size.  1 carbohydrate choice = 15 grams of  carbohydrate. Range of Carbohydrate Grams Per Choice  Carbohydrate Grams/Choice Carbohydrate Choices  6-10   11-20 1  21-25 1  26-35 2  36-40 2  41-50 3  51-55 3  56-65 4  66-70 4  71-80 5    Copyright 2020  Academy of Nutrition and Dietetics. All rights reserved.   Plate Method for Diabetes   Foods with carbohydrates make your blood glucose level go up. The plate method is a simple way to meal plan and control the amount of carbohydrate you eat.         Use the following guidance to build a healthy plate to control carbohydrates. Divide a 9-inch plate into 3 sections, and consider your beverage the 4th section of your meal: Food Group  Examples of Foods/Beverages for This Section of your Meal  Section 1: Non-starchy vegetables Fill  of your plate to include non-starchy vegetables Asparagus, broccoli, brussels sprouts, cabbage, carrots, cauliflower, celery, cucumber, green beans, mushrooms, peppers, salad greens, tomatoes, or zucchini.  Section 2: Protein foods Fill  of your plate to include a lean protein Lean meat, poultry, fish, seafood, cheese, eggs, lean deli meat, tofu, beans, lentils, nuts or nut butters.  Section 3: Carbohydrate foods Fill  of your plate to include carbohydrate foods Whole grains, whole wheat bread, brown rice, whole grain pasta, polenta, corn tortillas, fruit, or starchy vegetables (potatoes, green peas, corn, beans, acorn squash, and butternut squash). One cup of milk also counts as a food that contains carbohydrate.  Section 4: Beverage Choose water or a low-calorie drink for your beverage. Unsweetened tea, coffee, or flavored/sparkling water without added sugar.  Image reprinted with permission from The American Diabetes Association.  Copyright 2022 by the American Diabetes Association.   Copyright 2022  Academy of Nutrition and Dietetics. All rights reserved

## 2023-03-31 NOTE — Progress Notes (Signed)
 Nutrition Education Note  RD consulted for nutrition education regarding diabetes. RD working remotely at time of assessment.   Lab Results  Component Value Date   HGBA1C 15.7 (H) 03/30/2023    RD provided "Carbohydrate Counting for People with Diabetes" and "Plate Method for Diabetes" handout from the Academy of Nutrition and Dietetics to discharge instructions. RD [facility] education after discharge.    Body mass index is 24.22 kg/m. Pt meets criteria for normal based on current BMI.  Current diet order is Carb Modified, patient is consuming approximately 100% of meals at this time. Labs and medications reviewed.    Kirby Crigler RD, LDN Clinical Dietitian

## 2023-03-31 NOTE — Progress Notes (Signed)
   03/31/23 1305  TOC Brief Assessment  Insurance and Status Reviewed  Patient has primary care physician Yes  Home environment has been reviewed From home, spouse  Prior level of function: Independent  Prior/Current Home Services No current home services  Social Drivers of Health Review SDOH reviewed no interventions necessary  Readmission risk has been reviewed Yes  Transition of care needs no transition of care needs at this time     Transition of Care Department [facility]) has reviewed patient and no TOC needs have been identified at this time. We will continue to monitor patient advancement through interdisciplinary progression rounds. If new patient transition needs arise, please place a TOC consult.

## 2023-04-01 DIAGNOSIS — J0140 Acute pansinusitis, unspecified: Secondary | ICD-10-CM | POA: Diagnosis not present

## 2023-04-01 DIAGNOSIS — K219 Gastro-esophageal reflux disease without esophagitis: Secondary | ICD-10-CM | POA: Diagnosis not present

## 2023-04-01 DIAGNOSIS — I951 Orthostatic hypotension: Secondary | ICD-10-CM

## 2023-04-01 DIAGNOSIS — E1165 Type 2 diabetes mellitus with hyperglycemia: Secondary | ICD-10-CM | POA: Diagnosis not present

## 2023-04-01 DIAGNOSIS — R55 Syncope and collapse: Secondary | ICD-10-CM | POA: Diagnosis not present

## 2023-04-01 DIAGNOSIS — R739 Hyperglycemia, unspecified: Principal | ICD-10-CM

## 2023-04-01 LAB — CBC
HCT: 31.9 % — ABNORMAL LOW (ref 36.0–46.0)
Hemoglobin: 10.6 g/dL — ABNORMAL LOW (ref 12.0–15.0)
MCH: 30.9 pg (ref 26.0–34.0)
MCHC: 33.2 g/dL (ref 30.0–36.0)
MCV: 93 fL (ref 80.0–100.0)
Platelets: 148 10*3/uL — ABNORMAL LOW (ref 150–400)
RBC: 3.43 MIL/uL — ABNORMAL LOW (ref 3.87–5.11)
RDW: 12.3 % (ref 11.5–15.5)
WBC: 4.5 10*3/uL (ref 4.0–10.5)
nRBC: 0 % (ref 0.0–0.2)

## 2023-04-01 LAB — BASIC METABOLIC PANEL
Anion gap: 5 (ref 5–15)
BUN: 11 mg/dL (ref 6–20)
CO2: 21 mmol/L — ABNORMAL LOW (ref 22–32)
Calcium: 8.4 mg/dL — ABNORMAL LOW (ref 8.9–10.3)
Chloride: 108 mmol/L (ref 98–111)
Creatinine, Ser: 0.48 mg/dL (ref 0.44–1.00)
GFR, Estimated: >60 mL/min (ref >60)
Glucose, Bld: 396 mg/dL — ABNORMAL HIGH (ref 70–99)
Potassium: 3.3 mmol/L — ABNORMAL LOW (ref 3.5–5.1)
Sodium: 134 mmol/L — ABNORMAL LOW (ref 135–145)

## 2023-04-01 LAB — GLUCOSE, CAPILLARY: Glucose-Capillary: 303 mg/dL — ABNORMAL HIGH (ref 70–99)

## 2023-04-01 MED ORDER — TRESIBA FLEXTOUCH 100 UNIT/ML ~~LOC~~ SOPN: 20.0000 [IU] | PEN_INJECTOR | Freq: Two times a day (BID) | SUBCUTANEOUS | 2 refills | Status: DC

## 2023-04-01 MED ORDER — INSULIN ASPART 100 UNIT/ML IJ SOLN: 4.0000 [IU] | Freq: Three times a day (TID) | INTRAMUSCULAR | Status: DC

## 2023-04-01 MED ORDER — TRESIBA FLEXTOUCH 100 UNIT/ML ~~LOC~~ SOPN
20.0000 [IU] | PEN_INJECTOR | Freq: Two times a day (BID) | SUBCUTANEOUS | 2 refills | Status: DC
Start: 2023-04-01 — End: 2023-05-31

## 2023-04-01 MED ORDER — PANTOPRAZOLE SODIUM 40 MG PO TBEC
40.0000 mg | DELAYED_RELEASE_TABLET | Freq: Every day | ORAL | 1 refills | Status: DC
Start: 2023-04-02 — End: 2023-05-31

## 2023-04-01 MED ORDER — POTASSIUM CHLORIDE CRYS ER 20 MEQ PO TBCR
40.0000 meq | EXTENDED_RELEASE_TABLET | Freq: Once | ORAL | Status: AC
  Administered 2023-04-01: 40 meq via ORAL
  Filled 2023-04-01: qty 2

## 2023-04-01 MED ORDER — INSULIN GLARGINE-YFGN 100 UNIT/ML ~~LOC~~ SOLN
20.0000 [IU] | Freq: Two times a day (BID) | SUBCUTANEOUS | Status: DC
  Administered 2023-04-01: 20 [IU] via SUBCUTANEOUS
  Filled 2023-04-01 (×3): qty 0.2

## 2023-04-01 MED ORDER — INSULIN ASPART 100 UNIT/ML FLEXPEN
10.0000 [IU] | PEN_INJECTOR | Freq: Three times a day (TID) | SUBCUTANEOUS | 2 refills | Status: DC
Start: 2023-04-01 — End: 2023-05-31

## 2023-04-01 MED ORDER — INSULIN ASPART 100 UNIT/ML FLEXPEN: 10.0000 [IU] | PEN_INJECTOR | Freq: Three times a day (TID) | SUBCUTANEOUS | 1 refills | Status: DC

## 2023-04-01 NOTE — Discharge Summary (Signed)
 Physician Discharge Summary   Patient: Samantha Blackburn MRN: [id number] DOB: 30-Aug-1980  Admit date:     03/30/2023  Discharge date: 04/01/23  Discharge Physician: Vassie Loll   PCP: Billie Lade, MD   Recommendations at discharge:  Repeat basic metabolic panel to follow electrolytes and renal function Reassess blood pressure and adjust medication as needed Close monitoring of patient's CBGs/A1c with further adjustment to hypoglycemia regimen as needed  Discharge Diagnoses: Principal Problem:   Syncope Active Problems:   Uncontrolled type 2 diabetes mellitus with hyperglycemia (HCC)   Hyperglycemia   Acute pansinusitis   Gastroesophageal reflux disease   Orthostatic hypotension  Brief hospital mission narrative: Samantha Blackburn is a 43 y.o. female with medical history significant of type 2 diabetes, diabetic neuropathy, gastroesophageal reflux disease, hyperlipidemia and hypertension; who presented to the hospital secondary to syncope and collapse.  Patient reports being at work feeling lightheaded and She knows she ended up passing out.  In the process of falling patient ended up striking her head; no open wounds appreciated. Patient expressed no fever, no chest pain, no palpitations, no nausea, no vomiting, no dysuria, no focal weaknesses or sick contacts.   On further questioning she has expressed 3-4 days of feeling unwell, noticing increased blood sugar levels in her meters and also experiencing increased fatigue and polyuria.   Workup in the ED demonstrating CT scan of the head without acute intracranial abnormalities; normal WBCs, patient was afebrile and hemodynamically stable.  EKG/telemetry not demonstrating any significant arrhythmia.  Urinalysis positive for leukocytes esterase and increased specific gravity.  Patient was found to be with elevated blood sugar and borderline metabolic acidosis with a bicarb of 18.  Anion gap was closed.  IV fluids initiated and  insulin given.  TRH contacted to place patient in the hospital for further evaluation and management.  Assessment and Plan: 1-syncope and collapse -Associated with dehydration and orthostasis -Patient received fluid resuscitation and blood sugar levels has been further control. -Orthostatic vital signs resolved after fluid resuscitation -Patient advised to maintain adequate nutrition/hydration. -Cortisol and TSH levels within normal limits. -She is overall feeling better and reports no lightheadedness or any other complaints today.   2-hypertension -Continue to hold Benicar at the moment; patient to follow-up with PCP to reassess vital signs/BP fluctuation and further adjust antihypertensive regimen as needed. -Continue metoprolol -Heart healthy/low-sodium diet discussed with patient.   3-uncontrolled type 2 diabetes mellitus with hyperglycemia -Patient has been discharged on adjusted dose of rapid insulin for meal coverage and adjusted dose of long-acting Tresiba insulin. -Patient will also continue Mounjaro weekly and has been advised to follow modified carbohydrate diet -Continue patient follow-up with PCP to further adjust hypoglycemic regimen as needed. -A1c 15.7   4-hyponatremia/pseudohyponatremia -Resolved after fluid resuscitation and further control of patient's CBGs -Continue to follow electrolytes trend intermittently.   5-diabetic neuropathy -Continue Neurontin   6-hyperlipidemia -Continue statin   7-gastroesophageal disease -Continue PPI. -Lifestyle modification discussed with patient.   8-muscular pain -Continue as needed analgesics -Patient reports feeling better    Consultants: None Procedures performed: See below for x-ray reports. Disposition: Home Diet recommendation: Heart healthy and modified carbohydrates.  DISCHARGE MEDICATION: Allergies as of 04/01/2023       Reactions   Cymbalta [duloxetine Hcl]    Can't take it   Morphine Other (See  Comments)   Makes her feel weird.        Medication List     STOP taking these medications  amLODipine 5 MG tablet Commonly known as: NORVASC   doxycycline 100 MG tablet Commonly known as: VIBRA-TABS   olmesartan 20 MG tablet Commonly known as: BENICAR       TAKE these medications    atorvastatin 40 MG tablet Commonly known as: LIPITOR Take 1 tablet (40 mg total) by mouth daily.   cetirizine 10 MG tablet Commonly known as: ZYRTEC Take 1 tablet (10 mg total) by mouth daily.   gabapentin 600 MG tablet Commonly known as: NEURONTIN Take 1,200 mg by mouth 3 (three) times daily.   guaiFENesin 600 MG 12 hr tablet Commonly known as: MUCINEX Take 1,200 mg by mouth 2 (two) times daily as needed for to loosen phlegm or cough.   hydrOXYzine 25 MG capsule Commonly known as: VISTARIL Take 1 capsule (25 mg total) by mouth every 8 (eight) hours as needed.   ibuprofen 800 MG tablet Commonly known as: ADVIL Take 800 mg by mouth 3 (three) times daily as needed for moderate pain (pain score 4-6).   insulin aspart 100 UNIT/ML FlexPen Commonly known as: NOVOLOG Inject 10 Units into the skin 3 (three) times daily with meals. What changed:  how much to take how to take this when to take this additional instructions   metoprolol succinate 25 MG 24 hr tablet Commonly known as: Toprol XL Take 0.5 tablets (12.5 mg total) by mouth daily.   pantoprazole 40 MG tablet Commonly known as: PROTONIX Take 1 tablet (40 mg total) by mouth daily. Start taking on: April 02, 2023   SINUS PO Take 2 tablets by mouth daily as needed (Cold/Sinus symptoms).   Slynd 4 MG Tabs Generic drug: Drospirenone Take 1 tablet by mouth daily.   tirzepatide 10 MG/0.5ML Pen Commonly known as: MOUNJARO Inject 10 mg into the skin once a week.   Evaristo Bury FlexTouch 100 UNIT/ML FlexTouch Pen Generic drug: insulin degludec Inject 20 Units into the skin 2 (two) times daily. What changed:  how much to  take how to take this when to take this additional instructions        Follow-up Information     Billie Lade, MD. Schedule an appointment as soon as possible for a visit in 10 day(s).   Specialty: Internal Medicine Contact information: [address] [place] [address] [phone]                Discharge Exam: Ceasar Mons Weights   03/30/23 0928 03/30/23 1359  Weight: 65.8 kg 64 kg   General exam: Alert, awake, oriented x 3; in no acute distress and feeling ready to go home. Respiratory system: Clear to auscultation. Respiratory effort normal. Cardiovascular system:RRR. No murmurs, rubs, gallops. Gastrointestinal system: Abdomen is nondistended, soft and nontender. No organomegaly or masses felt. Normal bowel sounds heard. Central nervous system: No focal neurological deficits. Extremities: No cyanosis or clubbing appreciated. Skin: No petechiae. Psychiatry: Judgement and insight appear normal. Mood & affect appropriate.    Condition at discharge: Stable and improved.  The results of significant diagnostics from this hospitalization (including imaging, microbiology, ancillary and laboratory) are listed below for reference.   Imaging Studies: DG Chest Port 1 View Result Date: 03/30/2023 CLINICAL DATA:  Syncopal episode with fall at work. EXAM: PORTABLE CHEST 1 VIEW COMPARISON:  None Available. FINDINGS: Cardiac silhouette is normal in size and configuration. Normal mediastinal and hilar contours. Linear opacity noted at the left lung base consistent with scarring. Remainder of the lungs is clear. No convincing pleural effusion. No  pneumothorax. Skeletal structures are grossly intact. IMPRESSION: No active disease. Electronically Signed   By: Amie Portland M.D.   On: 03/30/2023 11:02   DG Knee Complete 4 Views Right Result Date: 03/30/2023 CLINICAL DATA:  Unwitnessed fall at work. Syncope. Right knee pain. EXAM: RIGHT KNEE - COMPLETE 4+ VIEW COMPARISON:  None  Available. FINDINGS: No evidence of fracture, dislocation, or joint effusion. No evidence of arthropathy or other focal bone abnormality. Soft tissues are unremarkable. IMPRESSION: Negative. Electronically Signed   By: Amie Portland M.D.   On: 03/30/2023 11:02   CT Head Wo Contrast Result Date: 03/30/2023 CLINICAL DATA:  Syncope/presyncope with cerebrovascular cause suspected EXAM: CT HEAD WITHOUT CONTRAST TECHNIQUE: Contiguous axial images were obtained from the base of the skull through the vertex without intravenous contrast. RADIATION DOSE REDUCTION: This exam was performed according to the departmental dose-optimization program which includes automated exposure control, adjustment of the mA and/or kV according to patient size and/or use of iterative reconstruction technique. COMPARISON:  None Available. FINDINGS: Brain: No evidence of acute infarction, hemorrhage, hydrocephalus, extra-axial collection or mass lesion/mass effect. Vascular: No hyperdense vessel or unexpected calcification. Skull: Normal. Negative for fracture or focal lesion. Sinuses/Orbits: Mucosal thickening in the paranasal sinuses which is generalized. Negative orbits. IMPRESSION: Normal appearance of the brain. Generalized mucosal thickening in the paranasal sinuses. Electronically Signed   By: Tiburcio Pea M.D.   On: 03/30/2023 10:20    Microbiology: Results for orders placed or performed during the hospital encounter of 03/30/23  Resp panel by RT-PCR (RSV, Flu A&B, Covid) Anterior Nasal Swab     Status: None   Collection Time: 03/30/23  9:54 AM   Specimen: Anterior Nasal Swab  Result Value Ref Range Status   SARS Coronavirus 2 by RT PCR NEGATIVE NEGATIVE Final    Comment: (NOTE) SARS-CoV-2 target nucleic acids are NOT DETECTED.  The SARS-CoV-2 RNA is generally detectable in upper respiratory specimens during the acute phase of infection. The lowest concentration of SARS-CoV-2 viral copies this assay can detect is 138  copies/mL. A negative result does not preclude SARS-Cov-2 infection and should not be used as the sole basis for treatment or other patient management decisions. A negative result may occur with  improper specimen collection/handling, submission of specimen other than nasopharyngeal swab, presence of viral mutation(s) within the areas targeted by this assay, and inadequate number of viral copies(<138 copies/mL). A negative result must be combined with clinical observations, patient history, and epidemiological information. The expected result is Negative.  Fact Sheet for Patients:  BloggerCourse.com  Fact Sheet for Healthcare Providers:  SeriousBroker.it  This test is no t yet approved or cleared by the [place] FDA and  has been authorized for detection and/or diagnosis of SARS-CoV-2 by FDA under an Emergency Use Authorization (EUA). This EUA will remain  in effect (meaning this test can be used) for the duration of the COVID-19 declaration under Section 564(b)(1) of the Act, 21 U.S.C.section 360bbb-3(b)(1), unless the authorization is terminated  or revoked sooner.       Influenza A by PCR NEGATIVE NEGATIVE Final   Influenza B by PCR NEGATIVE NEGATIVE Final    Comment: (NOTE) The Xpert Xpress SARS-CoV-2/FLU/RSV plus assay is intended as an aid in the diagnosis of influenza from Nasopharyngeal swab specimens and should not be used as a sole basis for treatment. Nasal washings and aspirates are unacceptable for Xpert Xpress SARS-CoV-2/FLU/RSV testing.  Fact Sheet for Patients: BloggerCourse.com  Fact Sheet for Healthcare Providers: SeriousBroker.it  This test is not yet approved or cleared by the [place] and has been authorized for detection and/or diagnosis of SARS-CoV-2 by FDA under an Emergency Use Authorization (EUA). This EUA will remain in effect (meaning  this test can be used) for the duration of the COVID-19 declaration under Section 564(b)(1) of the Act, 21 U.S.C. section 360bbb-3(b)(1), unless the authorization is terminated or revoked.     Resp Syncytial Virus by PCR NEGATIVE NEGATIVE Final    Comment: (NOTE) Fact Sheet for Patients: BloggerCourse.com  Fact Sheet for Healthcare Providers: SeriousBroker.it  This test is not yet approved or cleared by the [place] FDA and has been authorized for detection and/or diagnosis of SARS-CoV-2 by FDA under an Emergency Use Authorization (EUA). This EUA will remain in effect (meaning this test can be used) for the duration of the COVID-19 declaration under Section 564(b)(1) of the Act, 21 U.S.C. section 360bbb-3(b)(1), unless the authorization is terminated or revoked.  Performed at [facility], [address] [place], [place] [address]     Labs: CBC: Recent Labs  Lab 03/30/23 0926 04/01/23 0421  WBC 6.6 4.5  NEUTROABS 4.3  --   HGB 13.3 10.6*  HCT 36.7 31.9*  MCV 88.2 93.0  PLT 166 148*   Basic Metabolic Panel: Recent Labs  Lab 03/30/23 0926 03/30/23 1301 03/31/23 0354 04/01/23 0421  NA 130*  --  135 134*  K 4.0  --  3.5 3.3*  CL 100  --  108 108  CO2 18*  --  20* 21*  GLUCOSE 467*  --  225* 396*  BUN 11  --  9 11  CREATININE 0.55  --  0.52 0.48  CALCIUM 8.7*  --  7.9* 8.4*  MG  --  2.0  --   --    Liver Function Tests: Recent Labs  Lab 03/30/23 0926  AST 19  ALT 21  ALKPHOS 96  BILITOT 0.7  PROT 7.0  ALBUMIN 3.2*   CBG: Recent Labs  Lab 03/31/23 0738 03/31/23 1124 03/31/23 1644 03/31/23 2131 04/01/23 0716  GLUCAP 288* 258* 320* 280* 303*    Discharge time spent: greater than 30 minutes.  Signed: Vassie Loll, MD [facility] 04/01/2023

## 2023-04-01 NOTE — Plan of Care (Signed)
  Problem: Coping: Goal: Ability to adjust to condition or change in health will improve Outcome: Progressing   Problem: Education: Goal: Knowledge of General Education information will improve Description: Including pain rating scale, medication(s)/side effects and non-pharmacologic comfort measures Outcome: Progressing   Problem: Coping: Goal: Level of anxiety will decrease Outcome: Progressing   Problem: Pain Managment: Goal: General experience of comfort will improve and/or be controlled Outcome: Progressing

## 2023-04-02 ENCOUNTER — Telehealth: Payer: Self-pay | Admitting: Pharmacist

## 2023-04-02 NOTE — Telephone Encounter (Signed)
    Patient referral to pharmacy entered as emergent Placed call to patient to ensure she has medications She recently pick up her [place] Fillers  and [place] 7.5mg  She denied wanting CGM, but offered by PharmD She denied needing additional pharmacy support Encouraged patient to reach out to pharmacy to f/u  F/u PCP in 1 month    Kieth Brightly, PharmD, BCACP, CPP Clinical Pharmacist, [facility]

## 2023-04-06 ENCOUNTER — Telehealth: Admitting: Family Medicine

## 2023-04-06 DIAGNOSIS — J019 Acute sinusitis, unspecified: Secondary | ICD-10-CM | POA: Diagnosis not present

## 2023-04-06 MED ORDER — AMOXICILLIN-POT CLAVULANATE 875-125 MG PO TABS: 1.0000 | ORAL_TABLET | Freq: Two times a day (BID) | ORAL | 0 refills | Status: AC

## 2023-04-06 NOTE — Progress Notes (Signed)
 Virtual Visit Consent   Samantha Blackburn, you are scheduled for a virtual visit with a [facility] provider today. Just as with appointments in the office, your consent must be obtained to participate. Your consent will be active for this visit and any virtual visit you may have with one of our providers in the next 365 days. If you have a MyChart account, a copy of this consent can be sent to you electronically.  As this is a virtual visit, video technology does not allow for your provider to perform a traditional examination. This may limit your provider's ability to fully assess your condition. If your provider identifies any concerns that need to be evaluated in person or the need to arrange testing (such as labs, EKG, etc.), we will make arrangements to do so. Although advances in technology are sophisticated, we cannot ensure that it will always work on either your end or our end. If the connection with a video visit is poor, the visit may have to be switched to a telephone visit. With either a video or telephone visit, we are not always able to ensure that we have a secure connection.  By engaging in this virtual visit, you consent to the provision of healthcare and authorize for your insurance to be billed (if applicable) for the services provided during this visit. Depending on your insurance coverage, you may receive a charge related to this service.  I need to obtain your verbal consent now. Are you willing to proceed with your visit today? Samantha Blackburn has provided verbal consent on 04/06/2023 for a virtual visit (video or telephone). Reed Pandy, [place]  Date: 04/06/2023 11:53 AM   Virtual Visit via Video Note   I, Reed Pandy, connected with  Samantha Blackburn  ([id number], [birth year]) on 04/06/23 at 11:45 AM EDT by a video-enabled telemedicine application and verified that I am speaking with the correct person using two identifiers.  Location: Patient: Virtual Visit Location  Patient: Home Provider: Virtual Visit Location Provider: Home Office   I discussed the limitations of evaluation and management by telemedicine and the availability of in person appointments. The patient expressed understanding and agreed to proceed.    History of Present Illness: Samantha Blackburn is a 43 y.o. who identifies as a female who was assigned female at birth, and is being seen today for c/o sinus infection.  Pt states taking over the counter medication for over a week.  Pt states taking mucinex, sinus cold and flu and is not helping.  Pt states she tested negative for covid and flu.  Pt states she has pressure in her forehead and has no taste.  Pt states not getting any better. Pt also reports congestion.  Pt denies fever.  Pt states husband also had a sinus infection last week.   HPI: HPI  Problems:  Patient Active Problem List   Diagnosis Date Noted   Hyperglycemia 04/01/2023   Acute pansinusitis 04/01/2023   Gastroesophageal reflux disease 04/01/2023   Orthostatic hypotension 04/01/2023   Syncope and collapse 03/30/2023   Heart palpitations 12/10/2022   Easy bruising 12/10/2022   Anxiety 02/26/2022   Encounter for annual general medical examination with abnormal findings in adult 11/19/2021   Hyperlipidemia LDL goal <70 05/10/2021   Neuropathy 05/10/2021   Need for Tdap vaccination 05/10/2021   Hyponatremia 05/10/2021   Renal abscess 10/12/2020   Abnormal physical evaluation 05/26/2020   Uncontrolled type 2 diabetes mellitus with hyperglycemia (HCC) 05/09/2020   Hypertension  associated with diabetes (HCC) 05/09/2020   History of trichomoniasis 04/19/2016   Encounter to establish care 11/14/2015   Obesity 12/01/2012    Allergies:  Allergies  Allergen Reactions   Cymbalta [Duloxetine Hcl]     Can't take it   Morphine Other (See Comments)    Makes her feel weird.   Medications:  Current Outpatient Medications:    amoxicillin-clavulanate (AUGMENTIN) 875-125 MG  tablet, Take 1 tablet by mouth 2 (two) times daily for 7 days., Disp: 14 tablet, Rfl: 0   atorvastatin (LIPITOR) 40 MG tablet, Take 1 tablet (40 mg total) by mouth daily., Disp: 90 tablet, Rfl: 3   cetirizine (ZYRTEC) 10 MG tablet, Take 1 tablet (10 mg total) by mouth daily. (Patient taking differently: Take 10 mg by mouth daily as needed for allergies.), Disp: 30 tablet, Rfl: 11   gabapentin (NEURONTIN) 600 MG tablet, Take 1,200 mg by mouth 3 (three) times daily., Disp: , Rfl:    guaiFENesin (MUCINEX) 600 MG 12 hr tablet, Take 1,200 mg by mouth 2 (two) times daily as needed for to loosen phlegm or cough., Disp: , Rfl:    hydrOXYzine (VISTARIL) 25 MG capsule, Take 1 capsule (25 mg total) by mouth every 8 (eight) hours as needed., Disp: 30 capsule, Rfl: 2   ibuprofen (ADVIL) 800 MG tablet, Take 800 mg by mouth 3 (three) times daily as needed for moderate pain (pain score 4-6)., Disp: , Rfl:    insulin aspart (NOVOLOG) 100 UNIT/ML FlexPen, Inject 10 Units into the skin 3 (three) times daily with meals., Disp: 12 mL, Rfl: 2   insulin degludec (TRESIBA FLEXTOUCH) 100 UNIT/ML FlexTouch Pen, Inject 20 Units into the skin 2 (two) times daily., Disp: 12 mL, Rfl: 2   metoprolol succinate (TOPROL XL) 25 MG 24 hr tablet, Take 0.5 tablets (12.5 mg total) by mouth daily., Disp: 45 tablet, Rfl: 1   pantoprazole (PROTONIX) 40 MG tablet, Take 1 tablet (40 mg total) by mouth daily., Disp: 30 tablet, Rfl: 1   Pseudoephedrine-Acetaminophen (SINUS PO), Take 2 tablets by mouth daily as needed (Cold/Sinus symptoms)., Disp: , Rfl:    SLYND 4 MG TABS, Take 1 tablet by mouth daily., Disp: , Rfl:    tirzepatide (MOUNJARO) 10 MG/0.5ML Pen, Inject 10 mg into the skin once a week., Disp: 6 mL, Rfl: 0  Observations/Objective: Patient is well-developed, well-nourished in no acute distress.  Resting comfortably at home.  Head is normocephalic, atraumatic.  No labored breathing.  Speech is clear and coherent with logical  content.  Patient is alert and oriented at baseline.    Assessment and Plan: 1. Acute sinusitis, recurrence not specified, unspecified location (Primary) - amoxicillin-clavulanate (AUGMENTIN) 875-125 MG tablet; Take 1 tablet by mouth 2 (two) times daily for 7 days.  Dispense: 14 tablet; Refill: 0  -Start Abx for sinus infection -Advised Pt to also start Flonase as she states she has flonase at home but has not tried it yet  -Advised Pt if worsening symptoms to follow up with in person urgent care or PCP.  Follow Up Instructions: I discussed the assessment and treatment plan with the patient. The patient was provided an opportunity to ask questions and all were answered. The patient agreed with the plan and demonstrated an understanding of the instructions.  A copy of instructions were sent to the patient via MyChart unless otherwise noted below.    The patient was advised to call back or seek an in-person evaluation if the symptoms worsen or if  the condition fails to improve as anticipated.    Reed Pandy, PA-C

## 2023-04-06 NOTE — Patient Instructions (Signed)
 Samantha Blackburn, thank you for joining Reed Pandy, PA-C for today's virtual visit.  While this provider is not your primary care provider (PCP), if your PCP is located in our provider database this encounter information will be shared with them immediately following your visit.   A [facility] MyChart account gives you access to today's visit and all your visits, tests, and labs performed at [facility] " click here if you don't have a [facility] MyChart account or go to [email or web address]  Consent: (Patient) Samantha Blackburn provided verbal consent for this virtual visit at the beginning of the encounter.  Current Medications:  Current Outpatient Medications:    amoxicillin-clavulanate (AUGMENTIN) 875-125 MG tablet, Take 1 tablet by mouth 2 (two) times daily for 7 days., Disp: 14 tablet, Rfl: 0   atorvastatin (LIPITOR) 40 MG tablet, Take 1 tablet (40 mg total) by mouth daily., Disp: 90 tablet, Rfl: 3   cetirizine (ZYRTEC) 10 MG tablet, Take 1 tablet (10 mg total) by mouth daily. (Patient taking differently: Take 10 mg by mouth daily as needed for allergies.), Disp: 30 tablet, Rfl: 11   gabapentin (NEURONTIN) 600 MG tablet, Take 1,200 mg by mouth 3 (three) times daily., Disp: , Rfl:    guaiFENesin (MUCINEX) 600 MG 12 hr tablet, Take 1,200 mg by mouth 2 (two) times daily as needed for to loosen phlegm or cough., Disp: , Rfl:    hydrOXYzine (VISTARIL) 25 MG capsule, Take 1 capsule (25 mg total) by mouth every 8 (eight) hours as needed., Disp: 30 capsule, Rfl: 2   ibuprofen (ADVIL) 800 MG tablet, Take 800 mg by mouth 3 (three) times daily as needed for moderate pain (pain score 4-6)., Disp: , Rfl:    insulin aspart (NOVOLOG) 100 UNIT/ML FlexPen, Inject 10 Units into the skin 3 (three) times daily with meals., Disp: 12 mL, Rfl: 2   insulin degludec (TRESIBA FLEXTOUCH) 100 UNIT/ML FlexTouch Pen, Inject 20 Units into the skin 2 (two) times daily., Disp: 12 mL, Rfl: 2    metoprolol succinate (TOPROL XL) 25 MG 24 hr tablet, Take 0.5 tablets (12.5 mg total) by mouth daily., Disp: 45 tablet, Rfl: 1   pantoprazole (PROTONIX) 40 MG tablet, Take 1 tablet (40 mg total) by mouth daily., Disp: 30 tablet, Rfl: 1   Pseudoephedrine-Acetaminophen (SINUS PO), Take 2 tablets by mouth daily as needed (Cold/Sinus symptoms)., Disp: , Rfl:    SLYND 4 MG TABS, Take 1 tablet by mouth daily., Disp: , Rfl:    tirzepatide (MOUNJARO) 10 MG/0.5ML Pen, Inject 10 mg into the skin once a week., Disp: 6 mL, Rfl: 0   Medications ordered in this encounter:  Meds ordered this encounter  Medications   amoxicillin-clavulanate (AUGMENTIN) 875-125 MG tablet    Sig: Take 1 tablet by mouth 2 (two) times daily for 7 days.    Dispense:  14 tablet    Refill:  0     *If you need refills on other medications prior to your next appointment, please contact your pharmacy*  Follow-Up: Call back or seek an in-person evaluation if the symptoms worsen or if the condition fails to improve as anticipated.  [facility] [phone]  Other Instructions Sinus Infection, Adult A sinus infection, also called sinusitis, is inflammation of your sinuses. Sinuses are hollow spaces in the bones around your face. Your sinuses are located: Around your eyes. In the middle of your forehead. Behind your nose. In your cheekbones. Mucus normally drains out of your  sinuses. When your nasal tissues become inflamed or swollen, mucus can become trapped or blocked. This allows bacteria, viruses, and fungi to grow, which leads to infection. Most infections of the sinuses are caused by a virus. A sinus infection can develop quickly. It can last for up to 4 weeks (acute) or for more than 12 weeks (chronic). A sinus infection often develops after a cold. What are the causes? This condition is caused by anything that creates swelling in the sinuses or stops mucus from draining. This  includes: Allergies. Asthma. Infection from bacteria or viruses. Deformities or blockages in your nose or sinuses. Abnormal growths in the nose (nasal polyps). Pollutants, such as chemicals or irritants in the air. Infection from fungi. This is rare. What increases the risk? You are more likely to develop this condition if you: Have a weak body defense system (immune system). Do a lot of swimming or diving. Overuse nasal sprays. Smoke. What are the signs or symptoms? The main symptoms of this condition are pain and a feeling of pressure around the affected sinuses. Other symptoms include: Stuffy nose or congestion that makes it difficult to breathe through your nose. Thick yellow or greenish drainage from your nose. Tenderness, swelling, and warmth over the affected sinuses. A cough that may get worse at night. Decreased sense of smell and taste. Extra mucus that collects in the throat or the back of the nose (postnasal drip) causing a sore throat or bad breath. Tiredness (fatigue). Fever. How is this diagnosed? This condition is diagnosed based on: Your symptoms. Your medical history. A physical exam. Tests to find out if your condition is acute or chronic. This may include: Checking your nose for nasal polyps. Viewing your sinuses using a device that has a light (endoscope). Testing for allergies or bacteria. Imaging tests, such as an MRI or CT scan. In rare cases, a bone biopsy may be done to rule out more serious types of fungal sinus disease. How is this treated? Treatment for a sinus infection depends on the cause and whether your condition is chronic or acute. If caused by a virus, your symptoms should go away on their own within 10 days. You may be given medicines to relieve symptoms. They include: Medicines that shrink swollen nasal passages (decongestants). A spray that eases inflammation of the nostrils (topical intranasal corticosteroids). Rinses that help get rid  of thick mucus in your nose (nasal saline washes). Medicines that treat allergies (antihistamines). Over-the-counter pain relievers. If caused by bacteria, your health care provider may recommend waiting to see if your symptoms improve. Most bacterial infections will get better without antibiotic medicine. You may be given antibiotics if you have: A severe infection. A weak immune system. If caused by narrow nasal passages or nasal polyps, surgery may be needed. Follow these instructions at home: Medicines Take, use, or apply over-the-counter and prescription medicines only as told by your health care provider. These may include nasal sprays. If you were prescribed an antibiotic medicine, take it as told by your health care provider. Do not stop taking the antibiotic even if you start to feel better. Hydrate and humidify  Drink enough fluid to keep your urine pale yellow. Staying hydrated will help to thin your mucus. Use a cool mist humidifier to keep the humidity level in your home above 50%. Inhale steam for 10-15 minutes, 3-4 times a day, or as told by your health care provider. You can do this in the bathroom while a hot shower is  running. Limit your exposure to cool or dry air. Rest Rest as much as possible. Sleep with your head raised (elevated). Make sure you get enough sleep each night. General instructions  Apply a warm, moist washcloth to your face 3-4 times a day or as told by your health care provider. This will help with discomfort. Use nasal saline washes as often as told by your health care provider. Wash your hands often with soap and water to reduce your exposure to germs. If soap and water are not available, use hand sanitizer. Do not smoke. Avoid being around people who are smoking (secondhand smoke). Keep all follow-up visits. This is important. Contact a health care provider if: You have a fever. Your symptoms get worse. Your symptoms do not improve within 10  days. Get help right away if: You have a severe headache. You have persistent vomiting. You have severe pain or swelling around your face or eyes. You have vision problems. You develop confusion. Your neck is stiff. You have trouble breathing. These symptoms may be an emergency. Get help right away. Call 911. Do not wait to see if the symptoms will go away. Do not drive yourself to the hospital. Summary A sinus infection is soreness and inflammation of your sinuses. Sinuses are hollow spaces in the bones around your face. This condition is caused by nasal tissues that become inflamed or swollen. The swelling traps or blocks the flow of mucus. This allows bacteria, viruses, and fungi to grow, which leads to infection. If you were prescribed an antibiotic medicine, take it as told by your health care provider. Do not stop taking the antibiotic even if you start to feel better. Keep all follow-up visits. This is important. This information is not intended to replace advice given to you by your health care provider. Make sure you discuss any questions you have with your health care provider. Document Revised: 12/06/2020 Document Reviewed: 12/06/2020 Elsevier Patient Education  2024 Elsevier Inc.   If you have been instructed to have an in-person evaluation today at a local Urgent Care facility, please use the link below. It will take you to a list of all of our available [facility], including address, phone number and hours of operation. Please do not delay care.  [facility]  If you or a family member do not have a primary care provider, use the link below to schedule a visit and establish care. When you choose a [facility] primary care physician or advanced practice provider, you gain a long-term partner in health. Find a Primary Care Provider  Learn more about [facility]'s in-office and virtual care options: [place] - Get Care Now

## 2023-04-08 ENCOUNTER — Telehealth: Payer: Self-pay

## 2023-04-08 NOTE — Progress Notes (Signed)
 Care Guide Pharmacy Note  04/08/2023 Name: Samantha Blackburn MRN: [id number] DOB: 10-13-1980  Referred By: Billie Lade, MD Reason for referral: Care Coordination (Outreach to schedule with Pharm d )   Samantha Blackburn is a 43 y.o. year old female who is a primary care patient of Billie Lade, MD.  Samantha Blackburn was referred to the pharmacist for assistance related to: DMII  An unsuccessful telephone outreach was attempted today to contact the patient who was referred to the pharmacy team for assistance with medication management. Additional attempts will be made to contact the patient.  Samantha Blackburn , RMA     [facility]  [facility], [facility] Guide  Direct Dial: [phone]  Website: [email or web address]

## 2023-04-15 NOTE — Progress Notes (Signed)
 Care Guide Pharmacy Note  04/15/2023 Name: SULTANA TIERNEY MRN: [id number] DOB: 10/21/1980  Referred By: Billie Lade, MD Reason for referral: Care Coordination (Outreach to schedule with Pharm d )   KARYNN DEBLASI is a 43 y.o. year old female who is a primary care patient of Billie Lade, MD.  Corwin Levins was referred to the pharmacist for assistance related to: DMII  A second unsuccessful telephone outreach was attempted today to contact the patient who was referred to the pharmacy team for assistance with medication management. Additional attempts will be made to contact the patient.  Penne Lash , RMA     [facility]  [facility], [facility] Guide  Direct Dial: [phone]  Website: [email or web address]

## 2023-04-22 ENCOUNTER — Ambulatory Visit: Admitting: Nutrition

## 2023-04-24 ENCOUNTER — Telehealth: Admitting: Urgent Care

## 2023-04-24 DIAGNOSIS — H9202 Otalgia, left ear: Secondary | ICD-10-CM | POA: Diagnosis not present

## 2023-04-24 DIAGNOSIS — J0100 Acute maxillary sinusitis, unspecified: Secondary | ICD-10-CM | POA: Diagnosis not present

## 2023-04-24 DIAGNOSIS — H6992 Unspecified Eustachian tube disorder, left ear: Secondary | ICD-10-CM | POA: Diagnosis not present

## 2023-04-24 MED ORDER — AZELASTINE HCL 0.1 % NA SOLN: 2.0000 | Freq: Two times a day (BID) | NASAL | 0 refills | Status: DC

## 2023-04-24 MED ORDER — DOXYCYCLINE HYCLATE 100 MG PO TABS: 100.0000 mg | ORAL_TABLET | Freq: Two times a day (BID) | ORAL | 0 refills | Status: DC

## 2023-04-24 MED ORDER — PREDNISONE 10 MG PO TABS: ORAL_TABLET | ORAL | 0 refills | Status: DC

## 2023-04-24 MED ORDER — MONTELUKAST SODIUM 10 MG PO TABS
10.0000 mg | ORAL_TABLET | Freq: Every day | ORAL | 0 refills | Status: DC
Start: 2023-04-24 — End: 2023-05-31

## 2023-04-24 NOTE — Progress Notes (Signed)
 Virtual Visit Consent   Samantha Blackburn, you are scheduled for a virtual visit with a [facility] provider today. Just as with appointments in the office, your consent must be obtained to participate. Your consent will be active for this visit and any virtual visit you may have with one of our providers in the next 365 days. If you have a MyChart account, a copy of this consent can be sent to you electronically.  As this is a virtual visit, video technology does not allow for your provider to perform a traditional examination. This may limit your provider's ability to fully assess your condition. If your provider identifies any concerns that need to be evaluated in person or the need to arrange testing (such as labs, EKG, etc.), we will make arrangements to do so. Although advances in technology are sophisticated, we cannot ensure that it will always work on either your end or our end. If the connection with a video visit is poor, the visit may have to be switched to a telephone visit. With either a video or telephone visit, we are not always able to ensure that we have a secure connection.  By engaging in this virtual visit, you consent to the provision of healthcare and authorize for your insurance to be billed (if applicable) for the services provided during this visit. Depending on your insurance coverage, you may receive a charge related to this service.  I need to obtain your verbal consent now. Are you willing to proceed with your visit today? Samantha Blackburn has provided verbal consent on 04/24/2023 for a virtual visit (video or telephone). Maretta Bees, [place]  Date: 04/24/2023 6:25 PM   Virtual Visit via Video Note   I, Maretta Bees, connected with  Samantha Blackburn  ([id number], [birth year]) on 04/24/23 at  6:15 PM EDT by a video-enabled telemedicine application and verified that I am speaking with the correct person using two identifiers.  Location: Patient: Virtual Visit Location  Patient: Home Provider: Virtual Visit Location Provider: Home Office   I discussed the limitations of evaluation and management by telemedicine and the availability of in person appointments. The patient expressed understanding and agreed to proceed.    History of Present Illness: Samantha Blackburn is a 43 y.o. who identifies as a female who was assigned female at birth, and is being seen today for recurrent sinusitis.  HPI: HPI   43yo female presents for continued sinus symptoms. Pt was seen virtually on 04/06/23 and prescribed Augmentin BID x 7 days. Pt states she took this and started feeling better while on it, but sx returned once completed. She now is also experiencing L ear pain, pressure, popping noises and pain in the eustachian tube extending down her neck. She had a ct scan of her head on 03/30/23 showing mucosal thickening of her paranasal sinuses. She is also having some post nasal drainage. She was having high glucose readings mid-March, but states she has significantly improved these readings and her glucose today was 155.    Problems:  Patient Active Problem List   Diagnosis Date Noted   Hyperglycemia 04/01/2023   Acute pansinusitis 04/01/2023   Gastroesophageal reflux disease 04/01/2023   Orthostatic hypotension 04/01/2023   Syncope and collapse 03/30/2023   Heart palpitations 12/10/2022   Easy bruising 12/10/2022   Anxiety 02/26/2022   Encounter for annual general medical examination with abnormal findings in adult 11/19/2021   Hyperlipidemia LDL goal <70 05/10/2021   Neuropathy 05/10/2021  Need for Tdap vaccination 05/10/2021   Hyponatremia 05/10/2021   Renal abscess 10/12/2020   Abnormal physical evaluation 05/26/2020   Uncontrolled type 2 diabetes mellitus with hyperglycemia (HCC) 05/09/2020   Hypertension associated with diabetes (HCC) 05/09/2020   History of trichomoniasis 04/19/2016   Encounter to establish care 11/14/2015   Obesity 12/01/2012    Allergies:   Allergies  Allergen Reactions   Cymbalta [Duloxetine Hcl]     Can't take it   Morphine Other (See Comments)    Makes her feel weird.   Medications:  Current Outpatient Medications:    azelastine (ASTELIN) 0.1 % nasal spray, Place 2 sprays into both nostrils 2 (two) times daily. Use in each nostril as directed, Disp: 30 mL, Rfl: 0   doxycycline (VIBRA-TABS) 100 MG tablet, Take 1 tablet (100 mg total) by mouth 2 (two) times daily., Disp: 20 tablet, Rfl: 0   montelukast (SINGULAIR) 10 MG tablet, Take 1 tablet (10 mg total) by mouth at bedtime., Disp: 30 tablet, Rfl: 0   predniSONE (DELTASONE) 10 MG tablet, Take 2 tabs (20mg ) daily for three days, then 1 tab (10mg ) daily for three days, Disp: 9 tablet, Rfl: 0   atorvastatin (LIPITOR) 40 MG tablet, Take 1 tablet (40 mg total) by mouth daily., Disp: 90 tablet, Rfl: 3   cetirizine (ZYRTEC) 10 MG tablet, Take 1 tablet (10 mg total) by mouth daily. (Patient taking differently: Take 10 mg by mouth daily as needed for allergies.), Disp: 30 tablet, Rfl: 11   gabapentin (NEURONTIN) 600 MG tablet, Take 1,200 mg by mouth 3 (three) times daily., Disp: , Rfl:    guaiFENesin (MUCINEX) 600 MG 12 hr tablet, Take 1,200 mg by mouth 2 (two) times daily as needed for to loosen phlegm or cough., Disp: , Rfl:    hydrOXYzine (VISTARIL) 25 MG capsule, Take 1 capsule (25 mg total) by mouth every 8 (eight) hours as needed., Disp: 30 capsule, Rfl: 2   ibuprofen (ADVIL) 800 MG tablet, Take 800 mg by mouth 3 (three) times daily as needed for moderate pain (pain score 4-6)., Disp: , Rfl:    insulin aspart (NOVOLOG) 100 UNIT/ML FlexPen, Inject 10 Units into the skin 3 (three) times daily with meals., Disp: 12 mL, Rfl: 2   insulin degludec (TRESIBA FLEXTOUCH) 100 UNIT/ML FlexTouch Pen, Inject 20 Units into the skin 2 (two) times daily., Disp: 12 mL, Rfl: 2   metoprolol succinate (TOPROL XL) 25 MG 24 hr tablet, Take 0.5 tablets (12.5 mg total) by mouth daily., Disp: 45 tablet,  Rfl: 1   pantoprazole (PROTONIX) 40 MG tablet, Take 1 tablet (40 mg total) by mouth daily., Disp: 30 tablet, Rfl: 1   Pseudoephedrine-Acetaminophen (SINUS PO), Take 2 tablets by mouth daily as needed (Cold/Sinus symptoms)., Disp: , Rfl:    SLYND 4 MG TABS, Take 1 tablet by mouth daily., Disp: , Rfl:    tirzepatide (MOUNJARO) 10 MG/0.5ML Pen, Inject 10 mg into the skin once a week., Disp: 6 mL, Rfl: 0  Observations/Objective: Patient is well-developed, well-nourished in no acute distress.  Resting comfortably at home.  Head is normocephalic, atraumatic.  No labored breathing. No cough Speech is clear and coherent with logical content.  Patient is alert and oriented at baseline.  Pain to palpation of sinuses and pt pointing to L ET as source of pain.  Assessment and Plan: 1. Acute non-recurrent maxillary sinusitis (Primary) - doxycycline (VIBRA-TABS) 100 MG tablet; Take 1 tablet (100 mg total) by mouth 2 (two) times daily.  Dispense: 20 tablet; Refill: 0 - azelastine (ASTELIN) 0.1 % nasal spray; Place 2 sprays into both nostrils 2 (two) times daily. Use in each nostril as directed  Dispense: 30 mL; Refill: 0 - montelukast (SINGULAIR) 10 MG tablet; Take 1 tablet (10 mg total) by mouth at bedtime.  Dispense: 30 tablet; Refill: 0 - predniSONE (DELTASONE) 10 MG tablet; Take 2 tabs (20mg ) daily for three days, then 1 tab (10mg ) daily for three days  Dispense: 9 tablet; Refill: 0  2. Left ear pain - doxycycline (VIBRA-TABS) 100 MG tablet; Take 1 tablet (100 mg total) by mouth 2 (two) times daily.  Dispense: 20 tablet; Refill: 0 - azelastine (ASTELIN) 0.1 % nasal spray; Place 2 sprays into both nostrils 2 (two) times daily. Use in each nostril as directed  Dispense: 30 mL; Refill: 0 - montelukast (SINGULAIR) 10 MG tablet; Take 1 tablet (10 mg total) by mouth at bedtime.  Dispense: 30 tablet; Refill: 0 - predniSONE (DELTASONE) 10 MG tablet; Take 2 tabs (20mg ) daily for three days, then 1 tab (10mg )  daily for three days  Dispense: 9 tablet; Refill: 0  3. Eustachian tube dysfunction, left - doxycycline (VIBRA-TABS) 100 MG tablet; Take 1 tablet (100 mg total) by mouth 2 (two) times daily.  Dispense: 20 tablet; Refill: 0 - azelastine (ASTELIN) 0.1 % nasal spray; Place 2 sprays into both nostrils 2 (two) times daily. Use in each nostril as directed  Dispense: 30 mL; Refill: 0 - montelukast (SINGULAIR) 10 MG tablet; Take 1 tablet (10 mg total) by mouth at bedtime.  Dispense: 30 tablet; Refill: 0 - predniSONE (DELTASONE) 10 MG tablet; Take 2 tabs (20mg ) daily for three days, then 1 tab (10mg ) daily for three days  Dispense: 9 tablet; Refill: 0  Will start doxycycline bid as pt recently failed augmentin.  Glucose readings have normalized, so will do trial of low dose prednisone. Pt aware of possible glycemic reading increases and is aware to decrease or discontinue medication if significant spikes occur. Sinus rinses, flonase and azelastine to cleanse sinus passages and open ET. Singulair only if sx are refractory.  Follow Up Instructions: I discussed the assessment and treatment plan with the patient. The patient was provided an opportunity to ask questions and all were answered. The patient agreed with the plan and demonstrated an understanding of the instructions.  A copy of instructions were sent to the patient via MyChart unless otherwise noted below.   The patient was advised to call back or seek an in-person evaluation if the symptoms worsen or if the condition fails to improve as anticipated.    Maretta Bees, PA

## 2023-04-24 NOTE — Patient Instructions (Addendum)
 Samantha Blackburn, thank you for joining Maretta Bees, PA for today's virtual visit.  While this provider is not your primary care provider (PCP), if your PCP is located in our provider database this encounter information will be shared with them immediately following your visit.   A [facility] MyChart account gives you access to today's visit and all your visits, tests, and labs performed at [facility] " click here if you don't have a [facility] MyChart account or go to [email or web address]  Consent: (Patient) Samantha Blackburn provided verbal consent for this virtual visit at the beginning of the encounter.  Current Medications:  Current Outpatient Medications:    azelastine (ASTELIN) 0.1 % nasal spray, Place 2 sprays into both nostrils 2 (two) times daily. Use in each nostril as directed, Disp: 30 mL, Rfl: 0   doxycycline (VIBRA-TABS) 100 MG tablet, Take 1 tablet (100 mg total) by mouth 2 (two) times daily., Disp: 20 tablet, Rfl: 0   montelukast (SINGULAIR) 10 MG tablet, Take 1 tablet (10 mg total) by mouth at bedtime., Disp: 30 tablet, Rfl: 0   predniSONE (DELTASONE) 10 MG tablet, Take 2 tabs (20mg ) daily for three days, then 1 tab (10mg ) daily for three days, Disp: 9 tablet, Rfl: 0   atorvastatin (LIPITOR) 40 MG tablet, Take 1 tablet (40 mg total) by mouth daily., Disp: 90 tablet, Rfl: 3   cetirizine (ZYRTEC) 10 MG tablet, Take 1 tablet (10 mg total) by mouth daily. (Patient taking differently: Take 10 mg by mouth daily as needed for allergies.), Disp: 30 tablet, Rfl: 11   gabapentin (NEURONTIN) 600 MG tablet, Take 1,200 mg by mouth 3 (three) times daily., Disp: , Rfl:    guaiFENesin (MUCINEX) 600 MG 12 hr tablet, Take 1,200 mg by mouth 2 (two) times daily as needed for to loosen phlegm or cough., Disp: , Rfl:    hydrOXYzine (VISTARIL) 25 MG capsule, Take 1 capsule (25 mg total) by mouth every 8 (eight) hours as needed., Disp: 30 capsule, Rfl: 2   ibuprofen (ADVIL)  800 MG tablet, Take 800 mg by mouth 3 (three) times daily as needed for moderate pain (pain score 4-6)., Disp: , Rfl:    insulin aspart (NOVOLOG) 100 UNIT/ML FlexPen, Inject 10 Units into the skin 3 (three) times daily with meals., Disp: 12 mL, Rfl: 2   insulin degludec (TRESIBA FLEXTOUCH) 100 UNIT/ML FlexTouch Pen, Inject 20 Units into the skin 2 (two) times daily., Disp: 12 mL, Rfl: 2   metoprolol succinate (TOPROL XL) 25 MG 24 hr tablet, Take 0.5 tablets (12.5 mg total) by mouth daily., Disp: 45 tablet, Rfl: 1   pantoprazole (PROTONIX) 40 MG tablet, Take 1 tablet (40 mg total) by mouth daily., Disp: 30 tablet, Rfl: 1   Pseudoephedrine-Acetaminophen (SINUS PO), Take 2 tablets by mouth daily as needed (Cold/Sinus symptoms)., Disp: , Rfl:    SLYND 4 MG TABS, Take 1 tablet by mouth daily., Disp: , Rfl:    tirzepatide (MOUNJARO) 10 MG/0.5ML Pen, Inject 10 mg into the skin once a week., Disp: 6 mL, Rfl: 0   Medications ordered in this encounter:  Meds ordered this encounter  Medications   doxycycline (VIBRA-TABS) 100 MG tablet    Sig: Take 1 tablet (100 mg total) by mouth 2 (two) times daily.    Dispense:  20 tablet    Refill:  0   azelastine (ASTELIN) 0.1 % nasal spray    Sig: Place 2 sprays into both nostrils 2 (two) times  daily. Use in each nostril as directed    Dispense:  30 mL    Refill:  0    Use generic Astelin   montelukast (SINGULAIR) 10 MG tablet    Sig: Take 1 tablet (10 mg total) by mouth at bedtime.    Dispense:  30 tablet    Refill:  0   predniSONE (DELTASONE) 10 MG tablet    Sig: Take 2 tabs (20mg ) daily for three days, then 1 tab (10mg ) daily for three days    Dispense:  9 tablet    Refill:  0     *If you need refills on other medications prior to your next appointment, please contact your pharmacy*  Follow-Up: Call back or seek an in-person evaluation if the symptoms worsen or if the condition fails to improve as anticipated.  [facility] [phone]  Other Instructions Please take doxycycline twice daily until gone. Do not stop taking it early to prevent recurrence.  Take either one hour before, or two hours after food containing dairy.  Take 20mg  of prednisone in the morning tomorrow with breakfast. Check your glucose 1-2 hours after taking - if you have a significant spike, then decrease to 10mg  the following day. If you continue to have elevated glucose 1-2 hours after 10mg  dose, discontinue completely.  I have called in montelukast. Take this nightly for refractory symptoms.  You can resume your flonase. You can also add Azelastine nasal spray.   One way to help clear mucous the fastest is to use a sinus rinse. You can purchase these over the counter, I recommend Lloyd Huger Med sinus rinse or Simply Saline sinus rinse aerosol can. Flush the nasal passages; read the instruction sheet on how to perform this attached to your chart.    If you have been instructed to have an in-person evaluation today at a local Urgent Care facility, please use the link below. It will take you to a list of all of our available [facility], including address, phone number and hours of operation. Please do not delay care.  [facility]  If you or a family member do not have a primary care provider, use the link below to schedule a visit and establish care. When you choose a [facility] primary care physician or advanced practice provider, you gain a long-term partner in health. Find a Primary Care Provider  Learn more about [facility]'s in-office and virtual care options:  - Get Care Now

## 2023-05-03 ENCOUNTER — Ambulatory Visit: Attending: Nurse Practitioner | Admitting: Nurse Practitioner

## 2023-05-03 NOTE — Progress Notes (Deleted)
 Cardiology Office Note:  .   Date: 03/22/2023 ID:  Samantha Blackburn, DOB 06/17/1980, MRN [id number] PCP: Tobi Fortes, MD  [facility] Providers Cardiologist:  Armida Lander, MD    History of Present Illness: .   Samantha Blackburn is a 43 y.o. female with a PMH of palpitations, type 2 diabetes, and obesity, presents today for 6-week follow-up appointment.  Last seen by Dr. Armida Lander on December 27, 2022.  She was evaluated for palpitations that were chronic, started after contracting COVID-19.  Did note some associated dizziness and some shortness of breath.  Did admit to drinking mellow yellow up to 2 times per day, denied any alcohol or coffee intake.   14-day monitor revealed rare supraventricular ectopy and ventricular ectopy.  Was in normal sinus rhythm with average heart rate of 96 bpm.  Her reported symptoms correlated with normal sinus rhythm and mild sinus tachycardia, rare PVCs/PACs.  Today she presents for follow-up.  She admits to occasional episodes of racing heartbeat, says it is hard to catch her breath during these episodes, as it feels like she is running a marathon/ feels similar to a panic attack for her. Says she one time checked her HR and was found to be 129 bpm at rest at work, BP was found to be low. Denies any syncope, but admits to sensation of dizziness if turning too fast at times. Denies any chest pain, syncope, presyncope, orthopnea, PND, swelling or significant weight changes, acute bleeding, or claudication. Wants to know what she should do regarding a previous infected wound to LLE.    ROS: Negative. See HPI.   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 Father - positive for CABG, passed away 8 years ago from cancer  Studies Reviewed: Aaron Aas    EKG: EKG is not ordered today.   Cardiac monitor 03/2023:   14 day monitor   Rare supraventricular  ectopy in the form of isolated PACs, couplets   Rare ventricular ectopy in the form of isolated PVCs, couplets, triplets   Reported symptoms correlate with normal sinus rhythm and mild sinus tachycardia, rare PACs and PVCs     Patch Wear Time:  13 days and 22 hours (2024-12-23T21:05:01-498 to 2025-01-06T19:32:51-0500)   Patient had a min HR of 72 bpm, max HR of 157 bpm, and avg HR of 96 bpm. Predominant underlying rhythm was Sinus Rhythm. Isolated SVEs were rare (<1.0%), SVE Couplets were rare (<1.0%), and no SVE Triplets were present. Isolated VEs were rare (<1.0%,  79), VE Couplets were rare (<1.0%, 6), and VE Triplets were rare (<1.0%, 1).    Physical Exam:   VS:  There were no vitals taken for this visit.   Wt Readings from Last 3 Encounters:  03/30/23 141 lb 1.5 oz (64 kg)  03/22/23 149 lb (67.6 kg)  12/27/22 145 lb (65.8 kg)    GEN: Well nourished, well developed in no acute distress NECK: No JVD; No carotid bruits CARDIAC: S1/S2, RRR, no murmurs, rubs, gallops RESPIRATORY:  Clear to auscultation without rales, wheezing or rhonchi  ABDOMEN: Soft, non-tender, non-distended EXTREMITIES:  No edema; No deformity   ASSESSMENT AND PLAN: .    Dysautonomia/ Inappropriate Sinus Tachycardia?, Dizziness Past hx of resting heart rate > 100 bpm, episode happened at work with HR at 129 bpm, low BP during episode, symptomatic during these episodes with [facility] and feeling of  panic attack. Denies any significant caffeine use. See most recent monitor noted above. Says mother has had past hx of tachycardia. Will stop Amlodipine  and reduce Olmesartan  to 20 mg daily and begin Toprol  XL 12.5 mg daily. Discussed to monitor BP at home at least 2 hours after medications and sitting for 5-10 minutes. Discussed to notify our office if no improvement in symptoms. Discussed to wean off caffeine, increase salt intake, and will write Rx for compression stockings. Care and ED precautions discussed.   2. Type 2  Diabetes, skin wound Being managed by PCP. Continue current medication management and continue to follow with PCP. Wound to LLE appears red around wound, scabbed, recommended to f/u with PCP/UC for further management. Care and ED precautions discussed.   Dispo: Follow-up with me/APP in 6-8 weeks or sooner if anything changes.   Signed, Lasalle Pointer, NP

## 2023-05-09 ENCOUNTER — Other Ambulatory Visit: Payer: Self-pay | Admitting: Internal Medicine

## 2023-05-09 DIAGNOSIS — E1165 Type 2 diabetes mellitus with hyperglycemia: Secondary | ICD-10-CM

## 2023-05-09 MED ORDER — TIRZEPATIDE 10 MG/0.5ML ~~LOC~~ SOAJ: 10.0000 mg | SUBCUTANEOUS | 0 refills | Status: DC

## 2023-05-09 NOTE — Telephone Encounter (Signed)
 Copied from CRM [id number]. Topic: Clinical - Medication Refill >> May 09, 2023  9:48 AM Rosaria Common wrote: Most Recent Primary Care Visit:  Provider: DIXON, PHILLIP E  Department: [facility]  Visit Type: OFFICE VISIT  Date: 12/10/2022  Medication: tirzepatide  (MOUNJARO ) 10 MG/0.5ML Pen  Has the patient contacted their pharmacy? Yes (Agent: If no, request that the patient contact the pharmacy for the refill. If patient does not wish to contact the pharmacy document the reason why and proceed with request.) (Agent: If yes, when and what did the pharmacy advise?)  Is this the correct pharmacy for this prescription? Yes If no, delete pharmacy and type the correct one.  This is the patient's preferred pharmacy:    [id number] - [place], [place] - [address] [place] [address] Phone: [phone] Fax: [phone]   Has the prescription been filled recently? Yes  Is the patient out of the medication? Yes  Has the patient been seen for an appointment in the last year OR does the patient have an upcoming appointment? Yes  Can we respond through MyChart? Yes  Agent: Please be advised that Rx refills may take up to 3 business days. We ask that you follow-up with your pharmacy.

## 2023-05-14 ENCOUNTER — Ambulatory Visit: Admitting: Internal Medicine

## 2023-05-20 ENCOUNTER — Encounter: Payer: Self-pay | Admitting: Internal Medicine

## 2023-05-28 ENCOUNTER — Telehealth: Payer: Self-pay

## 2023-05-28 NOTE — Telephone Encounter (Signed)
 Copied from CRM [id number]. Topic: Appointments - Scheduling Inquiry for Clinic >> May 28, 2023  9:26 AM Ivette P wrote: Reason for CRM: Pt called in to schedule Hospital follow up with primary Dr. Kermit Ped and also to reschedule recently missed appt. Attempting to schedule pt but not appt slots are available for pt.   Please call pt to schedule within next 2 weeks.[phone]

## 2023-05-31 ENCOUNTER — Ambulatory Visit (INDEPENDENT_AMBULATORY_CARE_PROVIDER_SITE_OTHER)

## 2023-05-31 VITALS — BP 135/85 | HR 103 | Ht 64.0 in | Wt 161.1 lb

## 2023-05-31 DIAGNOSIS — E785 Hyperlipidemia, unspecified: Secondary | ICD-10-CM | POA: Diagnosis not present

## 2023-05-31 DIAGNOSIS — M5412 Radiculopathy, cervical region: Secondary | ICD-10-CM | POA: Diagnosis not present

## 2023-05-31 DIAGNOSIS — J0100 Acute maxillary sinusitis, unspecified: Secondary | ICD-10-CM

## 2023-05-31 DIAGNOSIS — Z794 Long term (current) use of insulin: Secondary | ICD-10-CM

## 2023-05-31 DIAGNOSIS — F419 Anxiety disorder, unspecified: Secondary | ICD-10-CM | POA: Diagnosis not present

## 2023-05-31 DIAGNOSIS — H9202 Otalgia, left ear: Secondary | ICD-10-CM

## 2023-05-31 DIAGNOSIS — H6992 Unspecified Eustachian tube disorder, left ear: Secondary | ICD-10-CM

## 2023-05-31 DIAGNOSIS — K219 Gastro-esophageal reflux disease without esophagitis: Secondary | ICD-10-CM

## 2023-05-31 DIAGNOSIS — E1165 Type 2 diabetes mellitus with hyperglycemia: Secondary | ICD-10-CM

## 2023-05-31 DIAGNOSIS — G629 Polyneuropathy, unspecified: Secondary | ICD-10-CM

## 2023-05-31 MED ORDER — GABAPENTIN 600 MG PO TABS: 1200.0000 mg | ORAL_TABLET | Freq: Three times a day (TID) | ORAL | 3 refills | Status: DC

## 2023-05-31 MED ORDER — MONTELUKAST SODIUM 10 MG PO TABS: 10.0000 mg | ORAL_TABLET | Freq: Every day | ORAL | 5 refills | Status: DC

## 2023-05-31 MED ORDER — HYDROXYZINE PAMOATE 25 MG PO CAPS: 25.0000 mg | ORAL_CAPSULE | Freq: Three times a day (TID) | ORAL | 5 refills | Status: AC | PRN

## 2023-05-31 MED ORDER — INSULIN ASPART 100 UNIT/ML FLEXPEN: 10.0000 [IU] | PEN_INJECTOR | Freq: Three times a day (TID) | SUBCUTANEOUS | 5 refills | Status: DC

## 2023-05-31 MED ORDER — ATORVASTATIN CALCIUM 40 MG PO TABS: 40.0000 mg | ORAL_TABLET | Freq: Every day | ORAL | 3 refills | Status: AC

## 2023-05-31 MED ORDER — TIRZEPATIDE 10 MG/0.5ML ~~LOC~~ SOAJ: 10.0000 mg | SUBCUTANEOUS | 1 refills | Status: DC

## 2023-05-31 MED ORDER — TRESIBA FLEXTOUCH 100 UNIT/ML ~~LOC~~ SOPN: 20.0000 [IU] | PEN_INJECTOR | Freq: Two times a day (BID) | SUBCUTANEOUS | 2 refills | Status: AC

## 2023-05-31 MED ORDER — PANTOPRAZOLE SODIUM 40 MG PO TBEC: 40.0000 mg | DELAYED_RELEASE_TABLET | Freq: Every day | ORAL | 5 refills | Status: DC

## 2023-05-31 MED ORDER — IBUPROFEN 800 MG PO TABS: 800.0000 mg | ORAL_TABLET | Freq: Three times a day (TID) | ORAL | 2 refills | Status: DC | PRN

## 2023-05-31 MED ORDER — METHOCARBAMOL 750 MG PO TABS: 750.0000 mg | ORAL_TABLET | Freq: Three times a day (TID) | ORAL | 2 refills | Status: AC | PRN

## 2023-05-31 MED ORDER — TRIAMCINOLONE ACETONIDE 40 MG/ML IJ SUSP
40.0000 mg | Freq: Once | INTRAMUSCULAR | Status: AC
  Administered 2023-05-31: 40 mg via INTRAMUSCULAR

## 2023-05-31 MED ORDER — METOPROLOL SUCCINATE ER 25 MG PO TB24: 12.5000 mg | ORAL_TABLET | Freq: Every day | ORAL | 2 refills | Status: AC

## 2023-05-31 NOTE — Progress Notes (Signed)
 Established Patient Office Visit  Subjective   Patient ID: Samantha Blackburn, female    DOB: 1980-12-12  Age: 43 y.o. MRN: [id number]  Chief Complaint  Patient presents with   Hospitalization Follow-up    Ed follow-up and refill of all meds    HPI  ED Course as of 05/27/23 1758  Mon May 27, 2023  1748 XR Cervical Spine Complete 4 Or 5 Views Images and report reviewed: IMPRESSION: 1. 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 2. No acute radiographic abnormality.  1752 Pt has been advised of xray results, use of medications and possible side effects, also need to follow up with her provider for further evaluation and treatment. Advised of return precautions as well and she verbalizes understanding.   Procedures   No results found for this visit on 05/27/23 (from the past 4464 hours).  ED Results No results found for any visits on 05/27/23. XR Cervical Spine Complete 4 Or 5 Views Result Date: 05/27/2023 Exam: Cervical Spine History: Cervical radiculopathy. Technique: 5 views Comparison: None. Findings: Limited exam T1 lateral and C1-C2 AP due to superimposition. Cervical vertebral body height and alignment is maintained. No acute fracture or prevertebral soft tissue swelling. 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 Uncovertebral hypertrophy at this level results in predominantly right osseous neural foraminal narrowing. Visualized lung apices clear.   1. 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 2. No acute radiographic abnormality. Signed (Electronic Signature): 05/27/2023 5:39 PM Signed By: Cathren Coaster, MD  Medications Administered:  Medications  dexAMETHasone (DECADRON) 4 MG tablet (has no administration in time range)  ketorolac  (TORADOL ) injection 15 mg (15 mg Intramuscular Given 05/27/23 1709)  dexAMETHasone (DECADRON) tablet 8 mg (8 mg Oral  Given 05/27/23 1709)   Patient Active Problem List   Diagnosis Date Noted   Cervical radiculopathy 06/04/2023   Hyperglycemia 04/01/2023   Acute pansinusitis 04/01/2023   Gastroesophageal reflux disease 04/01/2023   Orthostatic hypotension 04/01/2023   Syncope and collapse 03/30/2023   Heart palpitations 12/10/2022   Easy bruising 12/10/2022   Anxiety 02/26/2022   Encounter for annual general medical examination with abnormal findings in adult 11/19/2021   Hyperlipidemia LDL goal <70 05/10/2021   Neuropathy 05/10/2021   Need for Tdap vaccination 05/10/2021   Hyponatremia 05/10/2021   Renal abscess 10/12/2020   Abnormal physical evaluation 05/26/2020   Type 2 diabetes mellitus with hyperglycemia, with long-term current use of insulin  (HCC) 05/09/2020   Hypertension associated with diabetes (HCC) 05/09/2020   History of trichomoniasis 04/19/2016   Encounter to establish care 11/14/2015   Obesity 12/01/2012   Past Medical History:  Diagnosis Date   Diabetes mellitus without complication (HCC)    Diabetes mellitus, new onset (HCC) 11/29/2012   Obesity 12/01/2012   Trichimoniasis       ROS    Objective:     BP 135/85   Pulse (!) 103   Ht 5\' 4"  (1.626 m)   Wt 161 lb 1.3 oz (73.1 kg)   SpO2 97%   BMI 27.65 kg/m  BP Readings from Last 3 Encounters:  05/31/23 135/85  04/01/23 (!) 145/84  03/22/23 118/68   Wt Readings from Last 3 Encounters:  05/31/23 161 lb 1.3 oz (73.1 kg)  03/30/23 141 lb 1.5 oz (64 kg)  03/22/23 149 lb (67.6 kg)      Physical Exam Vitals and nursing note reviewed.  Constitutional:  Appearance: Normal appearance.  Eyes:     Extraocular Movements: Extraocular movements intact.     Pupils: Pupils are equal, round, and reactive to light.  Cardiovascular:     Rate and Rhythm: Normal rate and regular rhythm.  Pulmonary:     Effort: Pulmonary effort is normal.     Breath sounds: Normal breath sounds.  Musculoskeletal:     Cervical back:  Pain with movement present. No spinous process tenderness. Decreased range of motion.  Neurological:     Mental Status: She is alert and oriented to person, place, and time.  Psychiatric:        Mood and Affect: Mood normal.        Thought Content: Thought content normal.      No results found for any visits on 05/31/23.    The ASCVD Risk score (Arnett DK, et al., 2019) failed to calculate for the following reasons:   The valid total cholesterol range is 130 to 320 mg/dL    Assessment & Plan:   Problem List Items Addressed This Visit       Digestive   Gastroesophageal reflux disease   Relevant Medications   pantoprazole  (PROTONIX ) 40 MG tablet     Endocrine   Type 2 diabetes mellitus with hyperglycemia, with long-term current use of insulin  (HCC)   Relevant Medications   atorvastatin  (LIPITOR) 40 MG tablet   insulin  aspart (NOVOLOG ) 100 UNIT/ML FlexPen   insulin  degludec (TRESIBA  FLEXTOUCH) 100 UNIT/ML FlexTouch Pen   tirzepatide  (MOUNJARO ) 10 MG/0.5ML Pen     Nervous and Auditory   Neuropathy   Relevant Medications   gabapentin  (NEURONTIN ) 600 MG tablet   Cervical radiculopathy - Primary   Recent ER reports and imaging studies were reviewed.  Kenalog  shot administered in office.  Recommend ortho consult if no improvemnet.       Relevant Medications   methocarbamol  (ROBAXIN ) 750 MG tablet   gabapentin  (NEURONTIN ) 600 MG tablet   hydrOXYzine  (VISTARIL ) 25 MG capsule     Other   Hyperlipidemia LDL goal <70   Relevant Medications   atorvastatin  (LIPITOR) 40 MG tablet   metoprolol  succinate (TOPROL  XL) 25 MG 24 hr tablet   Anxiety   Relevant Medications   hydrOXYzine  (VISTARIL ) 25 MG capsule   Other Visit Diagnoses       Left ear pain       Relevant Medications   montelukast  (SINGULAIR ) 10 MG tablet       No follow-ups on file.    Alison Irvine, FNP

## 2023-06-04 ENCOUNTER — Telehealth: Payer: Self-pay | Admitting: Internal Medicine

## 2023-06-04 ENCOUNTER — Other Ambulatory Visit: Payer: Self-pay

## 2023-06-04 ENCOUNTER — Inpatient Hospital Stay

## 2023-06-04 DIAGNOSIS — M5412 Radiculopathy, cervical region: Secondary | ICD-10-CM

## 2023-06-04 NOTE — Assessment & Plan Note (Signed)
 Recent ER reports and imaging studies were reviewed.  Kenalog  shot administered in office.  Recommend ortho consult if no improvemnet.

## 2023-06-04 NOTE — Addendum Note (Signed)
 Addended by: Nasif Bos on: 06/04/2023 10:14 PM   Modules accepted: Level of Service

## 2023-06-04 NOTE — Telephone Encounter (Signed)
 Please advise     Copied from CRM [id number]. Topic: Appointments - Scheduling Inquiry for Clinic >> Jun 04, 2023 10:36 AM Stanly Early wrote: Reason for CRM: Patient saw Alison Irvine on may 16th and got a steroid shot. The provider stated if the shot does not help to give a callback. Patient would like an appointment schedule for orthopedics on her behalf as the provider stated at the time of visit.

## 2023-07-30 ENCOUNTER — Ambulatory Visit: Admitting: Orthopedic Surgery

## 2023-07-30 VITALS — BP 140/86 | HR 91 | Ht 64.0 in | Wt 156.0 lb

## 2023-07-30 DIAGNOSIS — M5412 Radiculopathy, cervical region: Secondary | ICD-10-CM

## 2023-07-31 ENCOUNTER — Encounter: Payer: Self-pay | Admitting: Orthopedic Surgery

## 2023-07-31 ENCOUNTER — Telehealth: Payer: Self-pay | Admitting: Orthopedic Surgery

## 2023-07-31 NOTE — Telephone Encounter (Signed)
 DR. ONESIMO   Patient called lvm to call back to schedule appt.  I called the patient back lvm to call the office

## 2023-07-31 NOTE — Progress Notes (Signed)
 New Patient Visit  Assessment: Samantha Blackburn is a 43 y.o. female with the following: 1. Radiculopathy, cervical region  Plan: Samantha Blackburn has some pain in her neck, with radiating pains in the left hand.  Specifically, the radiating pain continues through her shoulder, to the long finger on the left hand.  On physical exam, she does have some weakness in the left arm.  In addition, there is wasting of the first webspace in the right hand.  Due to the atrophy, and the loss of strength appreciated on exam, I have recommended a cervical spine MRI.  We have placed order in clinic today.  Once the results are available, we will discuss these findings in clinic.  Follow-up: Return for After MRI.  Subjective:  Chief Complaint  Patient presents with   Neck Pain    For approx 2 mos w/ shooting pain down to L hand long finger. Pt states she also has a loss of strength in the hand    History of Present Illness: Samantha Blackburn is a 43 y.o. female who has been referred by [name], FNP for evaluation of neck pain.  She has had pain in the left side of her neck for the past couple of months.  No specific injury.  She has noted radiating pains from the neck through the shoulder, and into the left hand.  Specifically, the long finger is affected.  It is affecting her ability to work, as she has a loss of strength in the left arm.  No prior injuries to her neck.  Medications are not been effective.  She was evaluated the emergency department around the time her symptoms began.   Review of Systems: No fevers or chills No numbness or tingling No chest pain No shortness of breath No bowel or bladder dysfunction No GI distress No headaches   Medical History:  Past Medical History:  Diagnosis Date   Diabetes mellitus without complication (HCC)    Diabetes mellitus, new onset (HCC) 11/29/2012   Obesity 12/01/2012   Trichimoniasis     Past Surgical History:  Procedure  Laterality Date   CHOLECYSTECTOMY      Family History  Problem Relation Age of Onset   Hypertension Mother    Diabetes Mother    Cancer Father        lymphoma, kidney,bladder, liver,prostate   Diabetes Father    Heart attack Father    Diabetes Maternal Grandmother    Heart attack Maternal Grandfather    Stroke Paternal Grandmother    Social History   Tobacco Use   Smoking status: Former    Current packs/day: 0.00    Average packs/day: 1 pack/day for 10.0 years (10.0 ttl pk-yrs)    Types: Cigarettes    Start date: 05/09/2000    Quit date: 05/10/2010    Years since quitting: 13.2   Smokeless tobacco: Never  Vaping Use   Vaping status: Never Used  Substance Use Topics   Alcohol use: No   Drug use: No    Allergies  Allergen Reactions   Cymbalta [Duloxetine Hcl]     Can't take it   Morphine Other (See Comments)    Makes her feel weird.    Current Meds  Medication Sig   atorvastatin  (LIPITOR) 40 MG tablet Take 1 tablet (40 mg total) by mouth daily.   cetirizine  (ZYRTEC ) 10 MG tablet Take 1 tablet (10 mg total) by mouth daily. (Patient taking differently: Take 10 mg by mouth daily  as needed for allergies.)   gabapentin  (NEURONTIN ) 600 MG tablet Take 2 tablets (1,200 mg total) by mouth 3 (three) times daily.   hydrOXYzine  (VISTARIL ) 25 MG capsule Take 1 capsule (25 mg total) by mouth every 8 (eight) hours as needed.   ibuprofen  (ADVIL ) 800 MG tablet Take 1 tablet (800 mg total) by mouth 3 (three) times daily as needed for moderate pain (pain score 4-6).   insulin  aspart (NOVOLOG ) 100 UNIT/ML FlexPen Inject 10 Units into the skin 3 (three) times daily with meals.   insulin  degludec (TRESIBA  FLEXTOUCH) 100 UNIT/ML FlexTouch Pen Inject 20 Units into the skin 2 (two) times daily.   methocarbamol  (ROBAXIN ) 750 MG tablet Take 1 tablet (750 mg total) by mouth every 8 (eight) hours as needed for muscle spasms.   metoprolol  succinate (TOPROL  XL) 25 MG 24 hr tablet Take 0.5 tablets  (12.5 mg total) by mouth daily.   montelukast  (SINGULAIR ) 10 MG tablet Take 1 tablet (10 mg total) by mouth at bedtime.   pantoprazole  (PROTONIX ) 40 MG tablet Take 1 tablet (40 mg total) by mouth daily.   SLYND 4 MG TABS Take 1 tablet by mouth daily.   tirzepatide  (MOUNJARO ) 10 MG/0.5ML Pen Inject 10 mg into the skin once a week.    Objective: BP (!) 140/86   Pulse 91   Ht 5' 4 (1.626 m)   Wt 156 lb (70.8 kg)   BMI 26.78 kg/m   Physical Exam:  General: Alert and oriented. and No acute distress. Gait: Normal gait.  Evaluation of the neck demonstrates no deformity.  No atrophy.  Positive Spurling's.  Positive Lhermitte's.  Mild weakness in grip strength.  There is atrophy of the first webspace on the right hand.  IMAGING: I personally reviewed images previously obtained from the ED   X-rays were previously obtained in the emergency department.  These are readily available in clinic today.  IMPRESSION:  1.   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  2.    No acute radiographic abnormality.    New Medications:  No orders of the defined types were placed in this encounter.     Oneil DELENA Horde, MD  07/31/2023 12:01 PM

## 2023-08-01 ENCOUNTER — Telehealth: Payer: Self-pay | Admitting: Orthopedic Surgery

## 2023-08-01 NOTE — Telephone Encounter (Signed)
 Dr. Onesimo pt - pt lvm after hours yesterday and stated that she uploaded her insurance via mychart for us  to use for obtaining approval for her MRI.  She would like a call back [phone].

## 2023-08-03 ENCOUNTER — Ambulatory Visit (HOSPITAL_COMMUNITY)
Admission: RE | Admit: 2023-08-03 | Discharge: 2023-08-03 | Disposition: A | Discharge status: Home / Self Care | Source: Ambulatory Visit | Attending: Orthopedic Surgery | Admitting: Orthopedic Surgery

## 2023-08-03 DIAGNOSIS — M5412 Radiculopathy, cervical region: Secondary | ICD-10-CM | POA: Insufficient documentation

## 2023-08-07 NOTE — Telephone Encounter (Signed)
 Called pt and SO answered and will have her to call back

## 2023-08-07 NOTE — Telephone Encounter (Signed)
 noted

## 2023-08-13 ENCOUNTER — Ambulatory Visit (INDEPENDENT_AMBULATORY_CARE_PROVIDER_SITE_OTHER): Admitting: Orthopedic Surgery

## 2023-08-13 ENCOUNTER — Encounter: Payer: Self-pay | Admitting: Orthopedic Surgery

## 2023-08-13 DIAGNOSIS — M5412 Radiculopathy, cervical region: Secondary | ICD-10-CM | POA: Diagnosis not present

## 2023-08-13 DIAGNOSIS — R0989 Other specified symptoms and signs involving the circulatory and respiratory systems: Secondary | ICD-10-CM | POA: Insufficient documentation

## 2023-08-13 NOTE — Progress Notes (Signed)
 Return patient Visit  Assessment: Samantha Blackburn is a 43 y.o. female with the following: 1. Radiculopathy, cervical region  Plan: Samantha Blackburn has some pain in her neck, with radiating pains in the left hand.  Specifically, the radiating pain continues through her shoulder, to the long finger on the left hand.  On physical exam, she does have some weakness in the left arm.  MRI of the cervical spine is without cord compression.  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  This is overall graded as mild.  We discussed multiple options, and I recommended a referral to Dr. Eldonna, for further discussion regarding injections.  She is in agreement with this plan.  She will return to clinic as needed.   Follow-up: Return for Referral to Dr. Eldonna.  Subjective:  Chief Complaint  Patient presents with   Neck Pain   Results    Review MRI c spine     History of Present Illness: Samantha Blackburn is a 43 y.o. female who returns to clinic for repeat evaluation of neck pain.  She has pain in the left side of her neck, radiating into the left arm.  She states this is affecting her strength.  This has been ongoing, and progressively worsening.  She has obtained an MRI, and is here to discuss the findings.  Review of Systems: No fevers or chills No numbness or tingling No chest pain No shortness of breath No bowel or bladder dysfunction No GI distress No headaches   Objective: There were no vitals taken for this visit.  Physical Exam:  General: Alert and oriented. and No acute distress. Gait: Normal gait.  Evaluation of the neck demonstrates no deformity.  No atrophy.  Positive Spurling's.  Positive Lhermitte's.  Mild weakness in grip strength.  There is atrophy of the first webspace on the right hand.  IMAGING: I personally reviewed images previously obtained from the ED   X-rays were previously obtained in the emergency department.  These are readily available in  clinic today.  IMPRESSION:  1.   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  2.    No acute radiographic abnormality.    New Medications:  No orders of the defined types were placed in this encounter.     Oneil DELENA Horde, MD  08/13/2023 4:44 PM

## 2023-08-13 NOTE — Patient Instructions (Signed)
 We are referring you to [facility] from [facility] address is [address] [place] The phone number is [phone]  The office will call you with an appointment Dr. Alvester Morin

## 2023-08-16 ENCOUNTER — Other Ambulatory Visit: Payer: Self-pay | Admitting: Physical Medicine and Rehabilitation

## 2023-08-16 MED ORDER — DIAZEPAM 5 MG PO TABS: ORAL_TABLET | ORAL | 0 refills | Status: DC

## 2023-08-31 ENCOUNTER — Other Ambulatory Visit: Payer: Self-pay

## 2023-09-02 ENCOUNTER — Ambulatory Visit

## 2023-09-02 VITALS — BP 162/98 | HR 101 | Ht 64.0 in | Wt 147.0 lb

## 2023-09-02 DIAGNOSIS — E1165 Type 2 diabetes mellitus with hyperglycemia: Secondary | ICD-10-CM | POA: Diagnosis not present

## 2023-09-02 DIAGNOSIS — G629 Polyneuropathy, unspecified: Secondary | ICD-10-CM

## 2023-09-02 MED ORDER — TIRZEPATIDE 10 MG/0.5ML ~~LOC~~ SOAJ: 10.0000 mg | SUBCUTANEOUS | 1 refills | Status: DC

## 2023-09-02 NOTE — Progress Notes (Unsigned)
 Established Patient Office Visit  Subjective   Patient ID: Samantha Blackburn, female    DOB: 12-31-1980  Age: 43 y.o. MRN: [id number]  Chief Complaint  Patient presents with   Medical Management of Chronic Issues    3 month follow up, Leg and arms weakness especially in arms left hand  is very weak    HPI  Patient Active Problem List   Diagnosis Date Noted   Right carotid bruit 08/13/2023   Cervical radiculopathy 06/04/2023   Hyperglycemia 04/01/2023   Acute pansinusitis 04/01/2023   Gastroesophageal reflux disease 04/01/2023   Orthostatic hypotension 04/01/2023   Syncope and collapse 03/30/2023   Heart palpitations 12/10/2022   Easy bruising 12/10/2022   Anxiety 02/26/2022   Encounter for annual general medical examination with abnormal findings in adult 11/19/2021   Hyperlipidemia LDL goal <70 05/10/2021   Neuropathy 05/10/2021   Need for Tdap vaccination 05/10/2021   Hyponatremia 05/10/2021   Trichomonas infection 03/27/2021   Renal abscess 10/12/2020   Diabetes mellitus type 2, insulin  dependent (HCC) 09/26/2020   Abnormal physical evaluation 05/26/2020   Type 2 diabetes mellitus with hyperglycemia, with long-term current use of insulin  (HCC) 05/09/2020   Hypertension associated with diabetes (HCC) 05/09/2020   Disorder of thyroid  gland 02/21/2018   History of trichomoniasis 04/19/2016   Encounter to establish care 11/14/2015   Obesity 12/01/2012    ROS    Objective:     BP (!) 162/98   Pulse (!) 101   Ht 5' 4 (1.626 m)   Wt 147 lb 0.6 oz (66.7 kg)   SpO2 96%   BMI 25.24 kg/m  BP Readings from Last 3 Encounters:  09/02/23 (!) 162/98  07/30/23 (!) 140/86  05/31/23 135/85   Wt Readings from Last 3 Encounters:  09/02/23 147 lb 0.6 oz (66.7 kg)  07/30/23 156 lb (70.8 kg)  05/31/23 161 lb 1.3 oz (73.1 kg)      Physical Exam Vitals and nursing note reviewed.  Constitutional:      Appearance: Normal appearance.  HENT:     Head: Normocephalic.   Eyes:     Extraocular Movements: Extraocular movements intact.     Pupils: Pupils are equal, round, and reactive to light.  Cardiovascular:     Rate and Rhythm: Normal rate and regular rhythm.  Pulmonary:     Effort: Pulmonary effort is normal.     Breath sounds: Normal breath sounds.  Musculoskeletal:     Cervical back: Normal range of motion and neck supple.  Neurological:     Mental Status: She is alert and oriented to person, place, and time.  Psychiatric:        Mood and Affect: Mood normal.        Thought Content: Thought content normal.      No results found for any visits on 09/02/23.  Last CBC Lab Results  Component Value Date   WBC 4.5 04/01/2023   HGB 10.6 (L) 04/01/2023   HCT 31.9 (L) 04/01/2023   MCV 93.0 04/01/2023   MCH 30.9 04/01/2023   RDW 12.3 04/01/2023   PLT 148 (L) 04/01/2023   Last metabolic panel Lab Results  Component Value Date   GLUCOSE 396 (H) 04/01/2023   NA 134 (L) 04/01/2023   K 3.3 (L) 04/01/2023   CL 108 04/01/2023   CO2 21 (L) 04/01/2023   BUN 11 04/01/2023   CREATININE 0.48 04/01/2023   GFRNONAA >60 04/01/2023   CALCIUM  8.4 (L) 04/01/2023   PROT  7.0 03/30/2023   ALBUMIN 3.2 (L) 03/30/2023   LABGLOB 2.5 12/10/2022   AGRATIO 1.4 11/13/2021   BILITOT 0.7 03/30/2023   ALKPHOS 96 03/30/2023   AST 19 03/30/2023   ALT 21 03/30/2023   ANIONGAP 5 04/01/2023   Last lipids Lab Results  Component Value Date   CHOL 121 06/26/2022   HDL 41 06/26/2022   LDLCALC 65 06/26/2022   TRIG 70 06/26/2022   CHOLHDL 3.0 06/26/2022   Last hemoglobin A1c Lab Results  Component Value Date   HGBA1C 15.7 (H) 03/30/2023   Last thyroid  functions Lab Results  Component Value Date   TSH 0.755 03/30/2023   Last vitamin D  Lab Results  Component Value Date   VD25OH 21.7 (L) 12/10/2022      The ASCVD Risk score (Arnett DK, et al., 2019) failed to calculate for the following reasons:   The valid total cholesterol range is 130 to 320  mg/dL    Assessment & Plan:   Problem List Items Addressed This Visit   None   No follow-ups on file.    [name], FNP

## 2023-09-03 ENCOUNTER — Ambulatory Visit: Payer: Self-pay

## 2023-09-03 NOTE — Assessment & Plan Note (Signed)
 A1c 15.7 on labs in March 2025.  Mounjaro  was increased to 10 mg weekly at that time.   -Repeat A1c and urine microalbumin/creatinine ratio ordered today Mounjaro  10 mg refilled.

## 2023-09-03 NOTE — Assessment & Plan Note (Signed)
 Bilateral upper extremity weakness and hand grip impairment Bilateral upper extremity weakness, more pronounced in the left hand, with difficulty opening bottles. Symptoms worsened over the past month. Previous MRI normal. Differential includes nerve impingement or neurological issues. - Refer to neurologist for evaluation.

## 2023-09-04 ENCOUNTER — Other Ambulatory Visit: Payer: Self-pay | Admitting: Urgent Care

## 2023-09-04 DIAGNOSIS — J0100 Acute maxillary sinusitis, unspecified: Secondary | ICD-10-CM

## 2023-09-04 DIAGNOSIS — H6992 Unspecified Eustachian tube disorder, left ear: Secondary | ICD-10-CM

## 2023-09-04 DIAGNOSIS — H9202 Otalgia, left ear: Secondary | ICD-10-CM

## 2023-09-04 LAB — CMP14+EGFR
ALT: 15 IU/L (ref 0–32)
AST: 17 IU/L (ref 0–40)
Albumin: 4 g/dL (ref 3.9–4.9)
Alkaline Phosphatase: 62 IU/L (ref 44–121)
BUN/Creatinine Ratio: 21 (ref 9–23)
BUN: 14 mg/dL (ref 6–24)
Bilirubin Total: 0.5 mg/dL (ref 0.0–1.2)
CO2: 19 mmol/L — ABNORMAL LOW (ref 20–29)
Calcium: 9 mg/dL (ref 8.7–10.2)
Chloride: 107 mmol/L — ABNORMAL HIGH (ref 96–106)
Creatinine, Ser: 0.66 mg/dL (ref 0.57–1.00)
Globulin, Total: 2.6 g/dL (ref 1.5–4.5)
Glucose: 87 mg/dL (ref 70–99)
Potassium: 3.6 mmol/L (ref 3.5–5.2)
Sodium: 142 mmol/L (ref 134–144)
Total Protein: 6.6 g/dL (ref 6.0–8.5)
eGFR: 112 mL/min/1.73 (ref >59)

## 2023-09-04 LAB — LIPID PANEL
Chol/HDL Ratio: 2.9 ratio (ref 0.0–4.4)
Cholesterol, Total: 139 mg/dL (ref 100–199)
HDL: 48 mg/dL (ref >39)
LDL Chol Calc (NIH): 80 mg/dL (ref 0–99)
Triglycerides: 48 mg/dL (ref 0–149)
VLDL Cholesterol Cal: 11 mg/dL (ref 5–40)

## 2023-09-04 LAB — MICROALBUMIN / CREATININE URINE RATIO
Creatinine, Urine: 231.8 mg/dL
Microalb/Creat Ratio: 16 mg/g{creat} (ref 0–29)
Microalbumin, Urine: 38 ug/mL

## 2023-09-04 LAB — HEMOGLOBIN A1C
Est. average glucose Bld gHb Est-mCnc: 103 mg/dL
Hgb A1c MFr Bld: 5.2 % (ref 4.8–5.6)

## 2023-09-05 ENCOUNTER — Telehealth: Payer: Self-pay | Admitting: Physical Medicine and Rehabilitation

## 2023-09-05 NOTE — Telephone Encounter (Signed)
 Pt called stating she has an upcoming appt with Eldonna 8/25 and was told they would send medication to keep her calm for her injection. Please send to [facility] in [place]. Pt phone number is [phone].

## 2023-09-09 ENCOUNTER — Ambulatory Visit: Admitting: Physical Medicine and Rehabilitation

## 2023-09-09 ENCOUNTER — Telehealth: Payer: PRIVATE HEALTH INSURANCE | Admitting: Physician Assistant

## 2023-09-09 ENCOUNTER — Other Ambulatory Visit: Payer: Self-pay

## 2023-09-09 VITALS — BP 153/95 | HR 88

## 2023-09-09 DIAGNOSIS — M5412 Radiculopathy, cervical region: Secondary | ICD-10-CM

## 2023-09-09 DIAGNOSIS — U071 COVID-19: Secondary | ICD-10-CM

## 2023-09-09 MED ORDER — METHYLPREDNISOLONE ACETATE 40 MG/ML IJ SUSP
40.0000 mg | Freq: Once | INTRAMUSCULAR | Status: AC
  Administered 2023-09-09: 40 mg

## 2023-09-09 MED ORDER — NIRMATRELVIR/RITONAVIR (PAXLOVID)TABLET: 3.0000 | ORAL_TABLET | Freq: Two times a day (BID) | ORAL | 0 refills | Status: AC

## 2023-09-09 NOTE — Patient Instructions (Signed)
 Samantha Blackburn, thank you for joining [name], PA-C for today's virtual visit.  While this provider is not your primary care provider (PCP), if your PCP is located in our provider database this encounter information will be shared with them immediately following your visit.   A [facility] MyChart account gives you access to today's visit and all your visits, tests, and labs performed at [facility]  click here if you don't have a [facility] MyChart account or go to [email or web address]  Consent: (Patient) Samantha Blackburn provided verbal consent for this virtual visit at the beginning of the encounter.  Current Medications:  Current Outpatient Medications:    nirmatrelvir /ritonavir  (PAXLOVID ) 20 x 150 MG & 10 x 100MG  TABS, Take 3 tablets by mouth 2 (two) times daily for 5 days. (Take nirmatrelvir  150 mg two tablets twice daily for 5 days and ritonavir  100 mg one tablet twice daily for 5 days) Patient GFR is 112, Disp: 30 tablet, Rfl: 0   atorvastatin  (LIPITOR) 40 MG tablet, Take 1 tablet (40 mg total) by mouth daily., Disp: 90 tablet, Rfl: 3   cetirizine  (ZYRTEC ) 10 MG tablet, Take 1 tablet (10 mg total) by mouth daily. (Patient taking differently: Take 10 mg by mouth daily as needed for allergies.), Disp: 30 tablet, Rfl: 11   diazepam  (VALIUM ) 5 MG tablet, Take one tablet by mouth with light food one hour prior to procedure., Disp: 1 tablet, Rfl: 0   gabapentin  (NEURONTIN ) 600 MG tablet, Take 2 tablets (1,200 mg total) by mouth 3 (three) times daily., Disp: 180 tablet, Rfl: 3   hydrOXYzine  (VISTARIL ) 25 MG capsule, Take 1 capsule (25 mg total) by mouth every 8 (eight) hours as needed., Disp: 30 capsule, Rfl: 5   ibuprofen  (ADVIL ) 800 MG tablet, TAKE 1 TABLET BY MOUTH 3 (THREE) TIMES DAILY AS NEEDED FOR MODERATE PAIN (PAIN SCORE 4-6)., Disp: 90 tablet, Rfl: 2   insulin  aspart (NOVOLOG ) 100 UNIT/ML FlexPen, Inject 10 Units into the skin 3 (three) times daily with  meals., Disp: 12 mL, Rfl: 5   methocarbamol  (ROBAXIN ) 750 MG tablet, Take 1 tablet (750 mg total) by mouth every 8 (eight) hours as needed for muscle spasms., Disp: 60 tablet, Rfl: 2   metoprolol  succinate (TOPROL  XL) 25 MG 24 hr tablet, Take 0.5 tablets (12.5 mg total) by mouth daily., Disp: 45 tablet, Rfl: 2   montelukast  (SINGULAIR ) 10 MG tablet, Take 1 tablet (10 mg total) by mouth at bedtime., Disp: 30 tablet, Rfl: 5   pantoprazole  (PROTONIX ) 40 MG tablet, Take 1 tablet (40 mg total) by mouth daily., Disp: 30 tablet, Rfl: 5   SLYND 4 MG TABS, Take 1 tablet by mouth daily., Disp: , Rfl:    tirzepatide  (MOUNJARO ) 10 MG/0.5ML Pen, Inject 10 mg into the skin once a week., Disp: 6 mL, Rfl: 1   Medications ordered in this encounter:  Meds ordered this encounter  Medications   nirmatrelvir /ritonavir  (PAXLOVID ) 20 x 150 MG & 10 x 100MG  TABS    Sig: Take 3 tablets by mouth 2 (two) times daily for 5 days. (Take nirmatrelvir  150 mg two tablets twice daily for 5 days and ritonavir  100 mg one tablet twice daily for 5 days) Patient GFR is 112    Dispense:  30 tablet    Refill:  0    Supervising Provider:   BLAISE ALEENE KIDD [[id number]]     *If you need refills on other medications prior to your next appointment, please contact  your pharmacy*  Follow-Up: Call back or seek an in-person evaluation if the symptoms worsen or if the condition fails to improve as anticipated.  [facility] [phone]  Care Instructions: Can take to lessen severity (if able): Vit C 500mg  twice daily Quercertin 250-500mg  twice daily Zinc 75-100mg  daily Melatonin 3-6 mg at bedtime Vit D3 1000-2000 IU daily Aspirin  81 mg daily with food Optional: Famotidine  20mg  daily Also can add tylenol /ibuprofen  as needed for fevers and body aches May add Mucinex or Mucinex DM as needed for cough/congestion    Isolation Instructions: You are to isolate at home until you have been fever free for at least 24 hours  without a fever-reducing medication, and symptoms have been steadily improving for 24 hours. At that time,  you can end isolation but need to mask for an additional 5 days.   If you must be around other household members who do not have symptoms, you need to make sure that both you and the family members are masking consistently with a high-quality mask.  If you note any worsening of symptoms despite treatment, please seek an in-person evaluation ASAP. If you note any significant shortness of breath or any chest pain, please seek ER evaluation. Please do not delay care!   COVID-19: What to Do if You Are Sick If you test positive and are an older adult or someone who is at high risk of getting very sick from COVID-19, treatment may be available. Contact a healthcare provider right away after a positive test to determine if you are eligible, even if your symptoms are mild right now. You can also visit a Test to Treat location and, if eligible, receive a prescription from a provider. Don't delay: Treatment must be started within the first few days to be effective. If you have a fever, cough, or other symptoms, you might have COVID-19. Most people have mild illness and are able to recover at home. If you are sick: Keep track of your symptoms. If you have an emergency warning sign (including trouble breathing), call 911. Steps to help prevent the spread of COVID-19 if you are sick If you are sick with COVID-19 or think you might have COVID-19, follow the steps below to care for yourself and to help protect other people in your home and community. Stay home except to get medical care Stay home. Most people with COVID-19 have mild illness and can recover at home without medical care. Do not leave your home, except to get medical care. Do not visit public areas and do not go to places where you are unable to wear a mask. Take care of yourself. Get rest and stay hydrated. Take over-the-counter medicines, such  as acetaminophen , to help you feel better. Stay in touch with your doctor. Call before you get medical care. Be sure to get care if you have trouble breathing, or have any other emergency warning signs, or if you think it is an emergency. Avoid public transportation, ride-sharing, or taxis if possible. Get tested If you have symptoms of COVID-19, get tested. While waiting for test results, stay away from others, including staying apart from those living in your household. Get tested as soon as possible after your symptoms start. Treatments may be available for people with COVID-19 who are at risk for becoming very sick. Don't delay: Treatment must be started early to be effective--some treatments must begin within 5 days of your first symptoms. Contact your healthcare provider right away if your  test result is positive to determine if you are eligible. Self-tests are one of several options for testing for the virus that causes COVID-19 and may be more convenient than laboratory-based tests and point-of-care tests. Ask your healthcare provider or your local health department if you need help interpreting your test results. You can visit your state, tribal, local, and territorial health department's website to look for the latest local information on testing sites. Separate yourself from other people As much as possible, stay in a specific room and away from other people and pets in your home. If possible, you should use a separate bathroom. If you need to be around other people or animals in or outside of the home, wear a well-fitting mask. Tell your close contacts that they may have been exposed to COVID-19. An infected person can spread COVID-19 starting 48 hours (or 2 days) before the person has any symptoms or tests positive. By letting your close contacts know they may have been exposed to COVID-19, you are helping to protect everyone. See COVID-19 and Animals if you have questions about pets. If you  are diagnosed with COVID-19, someone from the health department may call you. Answer the call to slow the spread. Monitor your symptoms Symptoms of COVID-19 include fever, cough, or other symptoms. Follow care instructions from your healthcare provider and local health department. Your local health authorities may give instructions on checking your symptoms and reporting information. When to seek emergency medical attention Look for emergency warning signs* for COVID-19. If someone is showing any of these signs, seek emergency medical care immediately: Trouble breathing Persistent pain or pressure in the chest New confusion Inability to wake or stay awake Pale, gray, or blue-colored skin, lips, or nail beds, depending on skin tone *This list is not all possible symptoms. Please call your medical provider for any other symptoms that are severe or concerning to you. Call 911 or call ahead to your local emergency facility: Notify the operator that you are seeking care for someone who has or may have COVID-19. Call ahead before visiting your doctor Call ahead. Many medical visits for routine care are being postponed or done by phone or telemedicine. If you have a medical appointment that cannot be postponed, call your doctor's office, and tell them you have or may have COVID-19. This will help the office protect themselves and other patients. If you are sick, wear a well-fitting mask You should wear a mask if you must be around other people or animals, including pets (even at home). Wear a mask with the best fit, protection, and comfort for you. You don't need to wear the mask if you are alone. If you can't put on a mask (because of trouble breathing, for example), cover your coughs and sneezes in some other way. Try to stay at least 6 feet away from other people. This will help protect the people around you. Masks should not be placed on young children under age 15 years, anyone who has trouble  breathing, or anyone who is not able to remove the mask without help. Cover your coughs and sneezes Cover your mouth and nose with a tissue when you cough or sneeze. Throw away used tissues in a lined trash can. Immediately wash your hands with soap and water for at least 20 seconds. If soap and water are not available, clean your hands with an alcohol-based hand sanitizer that contains at least 60% alcohol. Clean your hands often Wash your hands often with soap  and water for at least 20 seconds. This is especially important after blowing your nose, coughing, or sneezing; going to the bathroom; and before eating or preparing food. Use hand sanitizer if soap and water are not available. Use an alcohol-based hand sanitizer with at least 60% alcohol, covering all surfaces of your hands and rubbing them together until they feel dry. Soap and water are the best option, especially if hands are visibly dirty. Avoid touching your eyes, nose, and mouth with unwashed hands. Handwashing Tips Avoid sharing personal household items Do not share dishes, drinking glasses, cups, eating utensils, towels, or bedding with other people in your home. Wash these items thoroughly after using them with soap and water or put in the dishwasher. Clean surfaces in your home regularly Clean and disinfect high-touch surfaces (for example, doorknobs, tables, handles, light switches, and countertops) in your sick room and bathroom. In shared spaces, you should clean and disinfect surfaces and items after each use by the person who is ill. If you are sick and cannot clean, a caregiver or other person should only clean and disinfect the area around you (such as your bedroom and bathroom) on an as needed basis. Your caregiver/other person should wait as long as possible (at least several hours) and wear a mask before entering, cleaning, and disinfecting shared spaces that you use. Clean and disinfect areas that may have blood,  stool, or body fluids on them. Use household cleaners and disinfectants. Clean visible dirty surfaces with household cleaners containing soap or detergent. Then, use a household disinfectant. Use a product from [employer] List N: Disinfectants for Coronavirus (COVID-19). Be sure to follow the instructions on the label to ensure safe and effective use of the product. Many products recommend keeping the surface wet with a disinfectant for a certain period of time (look at contact time on the product label). You may also need to wear personal protective equipment, such as gloves, depending on the directions on the product label. Immediately after disinfecting, wash your hands with soap and water for 20 seconds. For completed guidance on cleaning and disinfecting your home, visit Complete Disinfection Guidance. Take steps to improve ventilation at home Improve ventilation (air flow) at home to help prevent from spreading COVID-19 to other people in your household. Clear out COVID-19 virus particles in the air by opening windows, using air filters, and turning on fans in your home. Use this interactive tool to learn how to improve air flow in your home. When you can be around others after being sick with COVID-19 Deciding when you can be around others is different for different situations. Find out when you can safely end home isolation. For any additional questions about your care, contact your healthcare provider or state or local health department. 04/05/2020 Content source: [facility] for Immunization and Respiratory Diseases ([facility]), Division of Viral Diseases This information is not intended to replace advice given to you by your health care provider. Make sure you discuss any questions you have with your health care provider. Document Revised: 05/19/2020 Document Reviewed: 05/19/2020 Elsevier Patient Education  2022 [employer].     If you have been instructed to have an in-person  evaluation today at a local Urgent Care facility, please use the link below. It will take you to a list of all of our available [facility], including address, phone number and hours of operation. Please do not delay care.  [facility]  If you or a family member  do not have a primary care provider, use the link below to schedule a visit and establish care. When you choose a [facility] primary care physician or advanced practice provider, you gain a long-term partner in health. Find a Primary Care Provider  Learn more about [facility]'s in-office and virtual care options: [facility] - Get Care Now

## 2023-09-09 NOTE — Progress Notes (Signed)
 Pain Scale   Average Pain 6 Patient advising she has chronic neck pain radiating to bilateral shoulders        +Driver, -BT, -Dye Allergies.

## 2023-09-09 NOTE — Progress Notes (Signed)
 Samantha Blackburn - 43 y.o. female MRN [id number]  Date of birth: 1980/10/02  Office Visit Note: Visit Date: 09/09/2023 PCP: Bevely Doffing, FNP Referred by: Bevely Doffing, FNP  Subjective: Chief Complaint  Patient presents with   Neck - Pain   HPI:  Samantha Blackburn is a 43 y.o. female who comes in today at the request of Dr. Oneil Horde for planned Left C7-T1 Cervical Interlaminar epidural steroid injection with fluoroscopic guidance.  The patient has failed conservative care including home exercise, medications, time and activity modification.  This injection will be diagnostic and hopefully therapeutic.  Please see requesting physician notes for further details and justification.   ROS Otherwise per HPI.  Assessment & Plan: Visit Diagnoses:    ICD-10-CM   1. Cervical radiculopathy  M54.12 XR C-ARM NO REPORT    Epidural Steroid injection    methylPREDNISolone  acetate (DEPO-MEDROL ) injection 40 mg      Plan: No additional findings.   Meds & Orders:  Meds ordered this encounter  Medications   methylPREDNISolone  acetate (DEPO-MEDROL ) injection 40 mg    Orders Placed This Encounter  Procedures   XR C-ARM NO REPORT   Epidural Steroid injection    Follow-up: Return for visit to requesting provider as needed.   Procedures: No procedures performed  Cervical Epidural Steroid Injection - Interlaminar Approach with Fluoroscopic Guidance  Patient: Samantha Blackburn      Date of Birth: 1980-12-31 MRN: [id number] PCP: Bevely Doffing, FNP      Visit Date: 09/09/2023   Universal Protocol:    Date/Time: 08/25/258:49 PM  Consent Given By: the patient  Position: PRONE  Additional Comments: Vital signs were monitored before and after the procedure. Patient was prepped and draped in the usual sterile fashion. The correct patient, procedure, and site was verified.   Injection Procedure Details:   Procedure diagnoses: Cervical radiculopathy [M54.12]    Meds Administered:   Meds ordered this encounter  Medications   methylPREDNISolone  acetate (DEPO-MEDROL ) injection 40 mg     Laterality: Left  Location/Site: C7-T1  Needle: 3.5 in., 20 ga. Tuohy  Needle Placement: Paramedian epidural space  Findings:  -Comments: Excellent flow of contrast into the epidural space.  Procedure Details: Using a paramedian approach from the side mentioned above, the region overlying the inferior lamina was localized under fluoroscopic visualization and the soft tissues overlying this structure were infiltrated with 4 ml. of 1% Lidocaine  without Epinephrine . A # 20 gauge, Tuohy needle was inserted into the epidural space using a paramedian approach.  The epidural space was localized using loss of resistance along with contralateral oblique bi-planar fluoroscopic views.  After negative aspirate for air, blood, and CSF, a 2 ml. volume of Isovue-250 was injected into the epidural space and the flow of contrast was observed. Radiographs were obtained for documentation purposes.   The injectate was administered into the level noted above.  Additional Comments:  The patient tolerated the procedure well Dressing: 2 x 2 sterile gauze and Band-Aid    Post-procedure details: Patient was observed during the procedure. Post-procedure instructions were reviewed.  Patient left the clinic in stable condition.   Clinical History: MRI CERVICAL SPINE WITHOUT CONTRAST   TECHNIQUE: Multiplanar, multisequence MR imaging of the cervical spine was performed. No intravenous contrast was administered.   COMPARISON:  None Available.   FINDINGS: Alignment: Physiologic.   Vertebrae: Vertebral bodies demonstrate normal signal intensity. No acute fracture is identified.   Cord: Normal signal and morphology.   Posterior Fossa,  vertebral arteries, paraspinal tissues: The visualized portions of the skull base and the posterior fossa are normal. Subcentimeter thyroid  nodule(s). No routine  follow-up required.   Disc levels:   C2-C3: The disk is normal in configuration. No facet arthropathy. No uncovertebral joint disease. No neuroforaminal stenosis. No spinal canal stenosis.   C3-C4: The disk is normal in configuration. No facet arthropathy. No uncovertebral joint disease. No neuroforaminal stenosis. No spinal canal stenosis.   C4-C5: Disc osteophyte complex. Mild bilateral facet arthropathy. Mild bilateral uncovertebral joint disease. Mild left neuroforaminal stenosis. No spinal canal stenosis.   C5-C6: Disc osteophyte complex with central disc protrusion. Mild bilateral facet arthropathy. Mild bilateral uncovertebral joint disease. Mild bilateral neuroforaminal stenosis. Mild spinal canal stenosis.   C6-C7: Disc osteophyte complex. No facet arthropathy. Mild left uncovertebral joint disease. Mild left neuroforaminal stenosis. No spinal canal stenosis.   C7-T1: Disc osteophyte complex. Mild bilateral facet arthropathy. No uncovertebral joint disease. No neuroforaminal stenosis. No spinal canal stenosis.   IMPRESSION: 1. Mild canal stenosis at C5-C6 secondary to disc osteophyte complex with central disc protrusion. Mild foraminal stenoses at several levels secondary to uncovertebral joint disease and facet arthropathy. 2. No signal abnormality of the cord.     Electronically Signed   By: Clem Savory M.D.   On: 08/07/2023 10:35     Objective:  VS:  HT:    WT:   BMI:     BP:(!) 153/95  HR:88bpm  TEMP: ( )  RESP:  Physical Exam Vitals and nursing note reviewed.  Constitutional:      General: She is not in acute distress.    Appearance: Normal appearance. She is not ill-appearing.  HENT:     Head: Normocephalic and atraumatic.     Right Ear: External ear normal.     Left Ear: External ear normal.  Eyes:     Extraocular Movements: Extraocular movements intact.  Cardiovascular:     Rate and Rhythm: Normal rate.     Pulses: Normal pulses.   Musculoskeletal:     Cervical back: Tenderness present. No rigidity.     Right lower leg: No edema.     Left lower leg: No edema.     Comments: Patient has good strength in the upper extremities including 5 out of 5 strength in wrist extension long finger flexion and APB.  There is no atrophy of the hands intrinsically.  There is a negative Hoffmann's test.   Lymphadenopathy:     Cervical: No cervical adenopathy.  Skin:    Findings: No erythema, lesion or rash.  Neurological:     General: No focal deficit present.     Mental Status: She is alert and oriented to person, place, and time.     Sensory: No sensory deficit.     Motor: No weakness or abnormal muscle tone.     Coordination: Coordination normal.  Psychiatric:        Mood and Affect: Mood normal.        Behavior: Behavior normal.      Imaging: XR C-ARM NO REPORT Result Date: 09/09/2023 Please see Notes tab for imaging impression.

## 2023-09-09 NOTE — Procedures (Signed)
 Cervical Epidural Steroid Injection - Interlaminar Approach with Fluoroscopic Guidance  Patient: Samantha Blackburn      Date of Birth: 12/26/1980 MRN: [id number] PCP: Bevely Doffing, FNP      Visit Date: 09/09/2023   Universal Protocol:    Date/Time: 08/25/258:49 PM  Consent Given By: the patient  Position: PRONE  Additional Comments: Vital signs were monitored before and after the procedure. Patient was prepped and draped in the usual sterile fashion. The correct patient, procedure, and site was verified.   Injection Procedure Details:   Procedure diagnoses: Cervical radiculopathy [M54.12]    Meds Administered:  Meds ordered this encounter  Medications   methylPREDNISolone  acetate (DEPO-MEDROL ) injection 40 mg     Laterality: Left  Location/Site: C7-T1  Needle: 3.5 in., 20 ga. Tuohy  Needle Placement: Paramedian epidural space  Findings:  -Comments: Excellent flow of contrast into the epidural space.  Procedure Details: Using a paramedian approach from the side mentioned above, the region overlying the inferior lamina was localized under fluoroscopic visualization and the soft tissues overlying this structure were infiltrated with 4 ml. of 1% Lidocaine  without Epinephrine . A # 20 gauge, Tuohy needle was inserted into the epidural space using a paramedian approach.  The epidural space was localized using loss of resistance along with contralateral oblique bi-planar fluoroscopic views.  After negative aspirate for air, blood, and CSF, a 2 ml. volume of Isovue-250 was injected into the epidural space and the flow of contrast was observed. Radiographs were obtained for documentation purposes.   The injectate was administered into the level noted above.  Additional Comments:  The patient tolerated the procedure well Dressing: 2 x 2 sterile gauze and Band-Aid    Post-procedure details: Patient was observed during the procedure. Post-procedure instructions were  reviewed.  Patient left the clinic in stable condition.

## 2023-09-09 NOTE — Progress Notes (Signed)
 Virtual Visit Consent   Samantha Blackburn, you are scheduled for a virtual visit with a [facility] provider today. Just as with appointments in the office, your consent must be obtained to participate. Your consent will be active for this visit and any virtual visit you may have with one of our providers in the next 365 days. If you have a MyChart account, a copy of this consent can be sent to you electronically.  As this is a virtual visit, video technology does not allow for your provider to perform a traditional examination. This may limit your provider's ability to fully assess your condition. If your provider identifies any concerns that need to be evaluated in person or the need to arrange testing (such as labs, EKG, etc.), we will make arrangements to do so. Although advances in technology are sophisticated, we cannot ensure that it will always work on either your end or our end. If the connection with a video visit is poor, the visit may have to be switched to a telephone visit. With either a video or telephone visit, we are not always able to ensure that we have a secure connection.  By engaging in this virtual visit, you consent to the provision of healthcare and authorize for your insurance to be billed (if applicable) for the services provided during this visit. Depending on your insurance coverage, you may receive a charge related to this service.  I need to obtain your verbal consent now. Are you willing to proceed with your visit today? Stephinie XAYLA PUZIO has provided verbal consent on 09/09/2023 for a virtual visit (video or telephone). [name], PA-C  Date: 09/09/2023 12:07 PM   Virtual Visit via Video Note   IDelon CHRISTELLA Dickinson, connected with  [name]  ([id number], 14-Oct-1980) on 09/09/23 at 12:00 PM EDT by a video-enabled telemedicine application and verified that I am speaking with the correct person using two identifiers.  Location: Patient: Virtual Visit  Location Patient: Home Provider: Virtual Visit Location Provider: Home Office   I discussed the limitations of evaluation and management by telemedicine and the availability of in person appointments. The patient expressed understanding and agreed to proceed.    History of Present Illness: Samantha Blackburn is a 43 y.o. who identifies as a female who was assigned female at birth, and is being seen today for Covid 3.  HPI: URI  This is a new problem. The current episode started yesterday (Symptoms started yesterday; tested positive for Covid 19). The problem has been unchanged. There has been no fever. Associated symptoms include congestion, headaches, a plugged ear sensation, rhinorrhea, sinus pain and a sore throat. Pertinent negatives include no coughing, diarrhea, ear pain, nausea, vomiting or wheezing. She has tried NSAIDs (ibuprofen ) for the symptoms. The treatment provided no relief.       Problems:  Patient Active Problem List   Diagnosis Date Noted   Right carotid bruit 08/13/2023   Cervical radiculopathy 06/04/2023   Hyperglycemia 04/01/2023   Acute pansinusitis 04/01/2023   Gastroesophageal reflux disease 04/01/2023   Orthostatic hypotension 04/01/2023   Syncope and collapse 03/30/2023   Heart palpitations 12/10/2022   Easy bruising 12/10/2022   Anxiety 02/26/2022   Encounter for annual general medical examination with abnormal findings in adult 11/19/2021   Hyperlipidemia LDL goal <70 05/10/2021   Neuropathy 05/10/2021   Need for Tdap vaccination 05/10/2021   Hyponatremia 05/10/2021   Trichomonas infection 03/27/2021   Renal abscess 10/12/2020   Diabetes  mellitus type 2, insulin  dependent (HCC) 09/26/2020   Abnormal physical evaluation 05/26/2020   Uncontrolled type 2 diabetes mellitus with hyperglycemia (HCC) 05/09/2020   Hypertension associated with diabetes (HCC) 05/09/2020   Disorder of thyroid  gland 02/21/2018   History of trichomoniasis 04/19/2016   Encounter  to establish care 11/14/2015   Obesity 12/01/2012    Allergies:  Allergies  Allergen Reactions   Cymbalta [Duloxetine Hcl]     Can't take it   Morphine Other (See Comments)    Makes her feel weird.   Medications:  Current Outpatient Medications:    nirmatrelvir /ritonavir  (PAXLOVID ) 20 x 150 MG & 10 x 100MG  TABS, Take 3 tablets by mouth 2 (two) times daily for 5 days. (Take nirmatrelvir  150 mg two tablets twice daily for 5 days and ritonavir  100 mg one tablet twice daily for 5 days) Patient GFR is 112, Disp: 30 tablet, Rfl: 0   atorvastatin  (LIPITOR) 40 MG tablet, Take 1 tablet (40 mg total) by mouth daily., Disp: 90 tablet, Rfl: 3   cetirizine  (ZYRTEC ) 10 MG tablet, Take 1 tablet (10 mg total) by mouth daily. (Patient taking differently: Take 10 mg by mouth daily as needed for allergies.), Disp: 30 tablet, Rfl: 11   diazepam  (VALIUM ) 5 MG tablet, Take one tablet by mouth with light food one hour prior to procedure., Disp: 1 tablet, Rfl: 0   gabapentin  (NEURONTIN ) 600 MG tablet, Take 2 tablets (1,200 mg total) by mouth 3 (three) times daily., Disp: 180 tablet, Rfl: 3   hydrOXYzine  (VISTARIL ) 25 MG capsule, Take 1 capsule (25 mg total) by mouth every 8 (eight) hours as needed., Disp: 30 capsule, Rfl: 5   ibuprofen  (ADVIL ) 800 MG tablet, TAKE 1 TABLET BY MOUTH 3 (THREE) TIMES DAILY AS NEEDED FOR MODERATE PAIN (PAIN SCORE 4-6)., Disp: 90 tablet, Rfl: 2   insulin  aspart (NOVOLOG ) 100 UNIT/ML FlexPen, Inject 10 Units into the skin 3 (three) times daily with meals., Disp: 12 mL, Rfl: 5   methocarbamol  (ROBAXIN ) 750 MG tablet, Take 1 tablet (750 mg total) by mouth every 8 (eight) hours as needed for muscle spasms., Disp: 60 tablet, Rfl: 2   metoprolol  succinate (TOPROL  XL) 25 MG 24 hr tablet, Take 0.5 tablets (12.5 mg total) by mouth daily., Disp: 45 tablet, Rfl: 2   montelukast  (SINGULAIR ) 10 MG tablet, Take 1 tablet (10 mg total) by mouth at bedtime., Disp: 30 tablet, Rfl: 5   pantoprazole   (PROTONIX ) 40 MG tablet, Take 1 tablet (40 mg total) by mouth daily., Disp: 30 tablet, Rfl: 5   SLYND 4 MG TABS, Take 1 tablet by mouth daily., Disp: , Rfl:    tirzepatide  (MOUNJARO ) 10 MG/0.5ML Pen, Inject 10 mg into the skin once a week., Disp: 6 mL, Rfl: 1  Observations/Objective: Patient is well-developed, well-nourished in no acute distress.  Resting comfortably at home.  Head is normocephalic, atraumatic.  No labored breathing.  Speech is clear and coherent with logical content.  Patient is alert and oriented at baseline.    Assessment and Plan: 1. COVID-19 (Primary) - nirmatrelvir /ritonavir  (PAXLOVID ) 20 x 150 MG & 10 x 100MG  TABS; Take 3 tablets by mouth 2 (two) times daily for 5 days. (Take nirmatrelvir  150 mg two tablets twice daily for 5 days and ritonavir  100 mg one tablet twice daily for 5 days) Patient GFR is 112  Dispense: 30 tablet; Refill: 0  - Continue OTC symptomatic management of choice - Will send OTC vitamins and supplement information through AVS - Paxlovid   prescribed - Push fluids - Rest as needed - Discussed return precautions and when to seek in-person evaluation, sent via AVS as well   Follow Up Instructions: I discussed the assessment and treatment plan with the patient. The patient was provided an opportunity to ask questions and all were answered. The patient agreed with the plan and demonstrated an understanding of the instructions.  A copy of instructions were sent to the patient via MyChart unless otherwise noted below.    The patient was advised to call back or seek an in-person evaluation if the symptoms worsen or if the condition fails to improve as anticipated.    [name], PA-C

## 2023-09-10 ENCOUNTER — Encounter: Payer: Self-pay | Admitting: Neurology

## 2023-10-06 ENCOUNTER — Other Ambulatory Visit: Payer: Self-pay

## 2023-10-06 DIAGNOSIS — G629 Polyneuropathy, unspecified: Secondary | ICD-10-CM

## 2023-10-07 ENCOUNTER — Other Ambulatory Visit: Payer: Self-pay | Admitting: Internal Medicine

## 2023-10-07 DIAGNOSIS — E1165 Type 2 diabetes mellitus with hyperglycemia: Secondary | ICD-10-CM

## 2023-10-14 ENCOUNTER — Telehealth: Admitting: Family Medicine

## 2023-10-14 DIAGNOSIS — E1165 Type 2 diabetes mellitus with hyperglycemia: Secondary | ICD-10-CM

## 2023-10-14 DIAGNOSIS — L089 Local infection of the skin and subcutaneous tissue, unspecified: Secondary | ICD-10-CM

## 2023-10-14 NOTE — Progress Notes (Signed)
 Pt is advised to go to the ED for eval and treatment of infection of rt great toe and is diabetic. On video wound appears significant with swelling of entire foot. She will see if her sister can take her to ED. Sx started with removal at home of hang nail. DWB

## 2023-11-18 ENCOUNTER — Encounter: Payer: Self-pay | Admitting: Radiology

## 2023-11-19 ENCOUNTER — Telehealth: Admitting: Family Medicine

## 2023-11-19 DIAGNOSIS — B9689 Other specified bacterial agents as the cause of diseases classified elsewhere: Secondary | ICD-10-CM

## 2023-11-19 DIAGNOSIS — J019 Acute sinusitis, unspecified: Secondary | ICD-10-CM

## 2023-11-19 MED ORDER — BENZONATATE 100 MG PO CAPS: 100.0000 mg | ORAL_CAPSULE | Freq: Three times a day (TID) | ORAL | 0 refills | Status: DC | PRN

## 2023-11-19 MED ORDER — AMOXICILLIN-POT CLAVULANATE 875-125 MG PO TABS: 1.0000 | ORAL_TABLET | Freq: Two times a day (BID) | ORAL | 0 refills | Status: AC

## 2023-11-19 NOTE — Patient Instructions (Addendum)
 Samantha Blackburn, thank you for joining Samantha [name], NP for today's virtual visit.  While this provider is not your primary care provider (PCP), if your PCP is located in our provider database this encounter information will be shared with them immediately following your visit.   A [facility] MyChart account gives you access to today's visit and all your visits, tests, and labs performed at [facility]  click here if you don't have a [facility] MyChart account or go to [email or web address]  Consent: (Patient) Samantha Blackburn provided verbal consent for this virtual visit at the beginning of the encounter.  Current Medications:  Current Outpatient Medications:    amoxicillin -clavulanate (AUGMENTIN ) 875-125 MG tablet, Take 1 tablet by mouth 2 (two) times daily for 7 days., Disp: 14 tablet, Rfl: 0   benzonatate  (TESSALON ) 100 MG capsule, Take 1 capsule (100 mg total) by mouth 3 (three) times daily as needed for cough., Disp: 30 capsule, Rfl: 0   atorvastatin  (LIPITOR) 40 MG tablet, Take 1 tablet (40 mg total) by mouth daily., Disp: 90 tablet, Rfl: 3   cetirizine  (ZYRTEC ) 10 MG tablet, Take 1 tablet (10 mg total) by mouth daily. (Patient taking differently: Take 10 mg by mouth daily as needed for allergies.), Disp: 30 tablet, Rfl: 11   diazepam  (VALIUM ) 5 MG tablet, Take one tablet by mouth with light food one hour prior to procedure., Disp: 1 tablet, Rfl: 0   gabapentin  (NEURONTIN ) 600 MG tablet, TAKE 2 TABLETS (1,200 MG TOTAL) BY MOUTH 3 (THREE) TIMES DAILY., Disp: 180 tablet, Rfl: 3   hydrOXYzine  (VISTARIL ) 25 MG capsule, Take 1 capsule (25 mg total) by mouth every 8 (eight) hours as needed., Disp: 30 capsule, Rfl: 5   ibuprofen  (ADVIL ) 800 MG tablet, TAKE 1 TABLET BY MOUTH 3 (THREE) TIMES DAILY AS NEEDED FOR MODERATE PAIN (PAIN SCORE 4-6)., Disp: 90 tablet, Rfl: 2   insulin  aspart (NOVOLOG ) 100 UNIT/ML FlexPen, Inject 10 Units into the skin 3 (three) times daily with  meals., Disp: 12 mL, Rfl: 5   methocarbamol  (ROBAXIN ) 750 MG tablet, Take 1 tablet (750 mg total) by mouth every 8 (eight) hours as needed for muscle spasms., Disp: 60 tablet, Rfl: 2   metoprolol  succinate (TOPROL  XL) 25 MG 24 hr tablet, Take 0.5 tablets (12.5 mg total) by mouth daily., Disp: 45 tablet, Rfl: 2   montelukast  (SINGULAIR ) 10 MG tablet, Take 1 tablet (10 mg total) by mouth at bedtime., Disp: 30 tablet, Rfl: 5   MOUNJARO  10 MG/0.5ML Pen, INJECT 10MG  INTO THE SKIN ONCE A WEEK, Disp: 6 mL, Rfl: 1   pantoprazole  (PROTONIX ) 40 MG tablet, Take 1 tablet (40 mg total) by mouth daily., Disp: 30 tablet, Rfl: 5   SLYND 4 MG TABS, Take 1 tablet by mouth daily., Disp: , Rfl:    Medications ordered in this encounter:  Meds ordered this encounter  Medications   amoxicillin -clavulanate (AUGMENTIN ) 875-125 MG tablet    Sig: Take 1 tablet by mouth 2 (two) times daily for 7 days.    Dispense:  14 tablet    Refill:  0    Supervising Provider:   LAMPTEY, PHILIP Blackburn [[id number]]   benzonatate  (TESSALON ) 100 MG capsule    Sig: Take 1 capsule (100 mg total) by mouth 3 (three) times daily as needed for cough.    Dispense:  30 capsule    Refill:  0    Supervising Provider:   BLAISE ALEENE Blackburn [id number]     *If  you need refills on other medications prior to your next appointment, please contact your pharmacy*  Follow-Up: Call back or seek an in-person evaluation if the symptoms worsen or if the condition fails to improve as anticipated.  [facility] [phone]  Other Instructions  URI recommendations: - Increased rest - Increasing Fluids - Acetaminophen  / ibuprofen  as needed for fever/pain.  - Salt water gargling, chloraseptic spray and throat lozenges - Mucinex if mucus is present and increasing.  - Saline nasal spray if congestion or if nasal passages feel dry. - Humidifying the air.    If you have been instructed to have an in-person evaluation today at a local Urgent Care  facility, please use the link below. It will take you to a list of all of our available [facility], including address, phone number and hours of operation. Please do not delay care.  [facility]  If you or a family member do not have a primary care provider, use the link below to schedule a visit and establish care. When you choose a [facility] primary care physician or advanced practice provider, you gain a long-term partner in health. Find a Primary Care Provider  Learn more about [facility] in-office and virtual care options:  - Get Care Now

## 2023-11-19 NOTE — Progress Notes (Signed)
 Virtual Visit Consent   Samantha Blackburn, you are scheduled for a virtual visit with a [facility] provider today. Just as with appointments in the office, your consent must be obtained to participate. Your consent will be active for this visit and any virtual visit you may have with one of our providers in the next 365 days. If you have a MyChart account, a copy of this consent can be sent to you electronically.  As this is a virtual visit, video technology does not allow for your provider to perform a traditional examination. This may limit your provider's ability to fully assess your condition. If your provider identifies any concerns that need to be evaluated in person or the need to arrange testing (such as labs, EKG, etc.), we will make arrangements to do so. Although advances in technology are sophisticated, we cannot ensure that it will always work on either your end or our end. If the connection with a video visit is poor, the visit may have to be switched to a telephone visit. With either a video or telephone visit, we are not always able to ensure that we have a secure connection.  By engaging in this virtual visit, you consent to the provision of healthcare and authorize for your insurance to be billed (if applicable) for the services provided during this visit. Depending on your insurance coverage, you may receive a charge related to this service.  I need to obtain your verbal consent now. Are you willing to proceed with your visit today? Samantha Blackburn has provided verbal consent on 11/19/2023 for a virtual visit (video or telephone). Chiquita [name], NP  Date: 11/19/2023 11:25 AM   Virtual Visit via Video Note   I, Chiquita [name], connected with  Samantha Blackburn  ([id number], 1980-06-05) on 11/19/23 at 11:30 AM EST by a video-enabled telemedicine application and verified that I am speaking with the correct person using two identifiers.  Location: Patient: Virtual Visit Location  Patient: Home Provider: Virtual Visit Location Provider: Home Office   I discussed the limitations of evaluation and management by telemedicine and the availability of in person appointments. The patient expressed understanding and agreed to proceed.    History of Present Illness: Samantha Blackburn is a 43 y.o. who identifies as a female who was assigned female at birth, and is being seen today for sinus and chest coughing  Onset was with sore throat last week, that improved then congestion set in. And has greatly worsen in last 24-48 hours, Associated symptoms are nasal congestion- can't get the congestion up, headache- sinus pressure, ear pain on the right- ache sensation, hoarseness. 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 COVID test: neg  Vaccines:  Flu weeks ago  Problems:  Patient Active Problem List   Diagnosis Date Noted   Right carotid bruit 08/13/2023   Cervical radiculopathy 06/04/2023   Hyperglycemia 04/01/2023   Acute pansinusitis 04/01/2023   Gastroesophageal reflux disease 04/01/2023   Orthostatic hypotension 04/01/2023   Syncope and collapse 03/30/2023   Heart palpitations 12/10/2022   Easy bruising 12/10/2022   Anxiety 02/26/2022   Encounter for annual general medical examination with abnormal findings in adult 11/19/2021   Hyperlipidemia LDL goal <70 05/10/2021   Neuropathy 05/10/2021   Need for Tdap vaccination 05/10/2021   Hyponatremia 05/10/2021  Trichomonas infection 03/27/2021   Renal abscess 10/12/2020   Diabetes mellitus type 2, insulin  dependent (HCC) 09/26/2020   Abnormal physical evaluation 05/26/2020   Uncontrolled type 2 diabetes mellitus with hyperglycemia (HCC) 05/09/2020   Hypertension associated with diabetes (HCC) 05/09/2020   Disorder of thyroid  gland 02/21/2018    History of trichomoniasis 04/19/2016   Encounter to establish care 11/14/2015   Obesity 12/01/2012    Allergies:  Allergies  Allergen Reactions   Cymbalta [Duloxetine Hcl]     Can't take it   Morphine Other (See Comments)    Makes her feel weird.   Medications:  Current Outpatient Medications:    atorvastatin  (LIPITOR) 40 MG tablet, Take 1 tablet (40 mg total) by mouth daily., Disp: 90 tablet, Rfl: 3   cetirizine  (ZYRTEC ) 10 MG tablet, Take 1 tablet (10 mg total) by mouth daily. (Patient taking differently: Take 10 mg by mouth daily as needed for allergies.), Disp: 30 tablet, Rfl: 11   diazepam  (VALIUM ) 5 MG tablet, Take one tablet by mouth with light food one hour prior to procedure., Disp: 1 tablet, Rfl: 0   gabapentin  (NEURONTIN ) 600 MG tablet, TAKE 2 TABLETS (1,200 MG TOTAL) BY MOUTH 3 (THREE) TIMES DAILY., Disp: 180 tablet, Rfl: 3   hydrOXYzine  (VISTARIL ) 25 MG capsule, Take 1 capsule (25 mg total) by mouth every 8 (eight) hours as needed., Disp: 30 capsule, Rfl: 5   ibuprofen  (ADVIL ) 800 MG tablet, TAKE 1 TABLET BY MOUTH 3 (THREE) TIMES DAILY AS NEEDED FOR MODERATE PAIN (PAIN SCORE 4-6)., Disp: 90 tablet, Rfl: 2   insulin  aspart (NOVOLOG ) 100 UNIT/ML FlexPen, Inject 10 Units into the skin 3 (three) times daily with meals., Disp: 12 mL, Rfl: 5   methocarbamol  (ROBAXIN ) 750 MG tablet, Take 1 tablet (750 mg total) by mouth every 8 (eight) hours as needed for muscle spasms., Disp: 60 tablet, Rfl: 2   metoprolol  succinate (TOPROL  XL) 25 MG 24 hr tablet, Take 0.5 tablets (12.5 mg total) by mouth daily., Disp: 45 tablet, Rfl: 2   montelukast  (SINGULAIR ) 10 MG tablet, Take 1 tablet (10 mg total) by mouth at bedtime., Disp: 30 tablet, Rfl: 5   MOUNJARO  10 MG/0.5ML Pen, INJECT 10MG  INTO THE SKIN ONCE A WEEK, Disp: 6 mL, Rfl: 1   pantoprazole  (PROTONIX ) 40 MG tablet, Take 1 tablet (40 mg total) by mouth daily., Disp: 30 tablet, Rfl: 5   SLYND 4 MG TABS, Take 1 tablet by mouth daily., Disp: ,  Rfl:   Observations/Objective: Patient is well-developed, well-nourished in no acute distress.  Resting comfortably  at home.  Head is normocephalic, atraumatic.  No labored breathing.  Speech is clear and coherent with logical content.  Patient is alert and oriented at baseline.  Congestion and cough present   Assessment and Plan:  1. Acute bacterial sinusitis (Primary)  - amoxicillin -clavulanate (AUGMENTIN ) 875-125 MG tablet; Take 1 tablet by mouth 2 (two) times daily for 7 days.  Dispense: 14 tablet; Refill: 0 - benzonatate  (TESSALON ) 100 MG capsule; Take 1 capsule (100 mg total) by mouth 3 (three) times daily as needed for cough.  Dispense: 30 capsule; Refill: 0   URI recommendations: - Increased rest - Increasing Fluids - Acetaminophen  / ibuprofen  as needed for fever/pain.  - Salt water gargling, chloraseptic spray and throat lozenges - Mucinex if mucus is present and increasing.  - Saline nasal spray if congestion or if nasal passages feel dry. - Humidifying the air.   Reviewed side effects, risks and benefits of  medication.    Patient acknowledged agreement and understanding of the plan.   Past Medical, Surgical, Social History, Allergies, and Medications have been Reviewed.     Follow Up Instructions: I discussed the assessment and treatment plan with the patient. The patient was provided an opportunity to ask questions and all were answered. The patient agreed with the plan and demonstrated an understanding of the instructions.  A copy of instructions were sent to the patient via MyChart unless otherwise noted below.    The patient was advised to call back or seek an in-person evaluation if the symptoms worsen or if the condition fails to improve as anticipated.    Chiquita [name], NP

## 2023-11-23 ENCOUNTER — Telehealth: Admitting: Family Medicine

## 2023-11-23 DIAGNOSIS — B9689 Other specified bacterial agents as the cause of diseases classified elsewhere: Secondary | ICD-10-CM

## 2023-11-23 DIAGNOSIS — J019 Acute sinusitis, unspecified: Secondary | ICD-10-CM | POA: Diagnosis not present

## 2023-11-23 MED ORDER — PREDNISONE 10 MG (21) PO TBPK: ORAL_TABLET | ORAL | 0 refills | Status: DC

## 2023-11-23 NOTE — Patient Instructions (Signed)
 Samantha Blackburn, thank you for joining Roosvelt Mater, PA-C for today's virtual visit.  While this provider is not your primary care provider (PCP), if your PCP is located in our provider database this encounter information will be shared with them immediately following your visit.   A [facility] MyChart account gives you access to today's visit and all your visits, tests, and labs performed at [facility]  click here if you don't have a [facility] MyChart account or go to [email or web address]  Consent: (Patient) Samantha Blackburn provided verbal consent for this virtual visit at the beginning of the encounter.  Current Medications:  Current Outpatient Medications:    predniSONE  (STERAPRED UNI-PAK 21 TAB) 10 MG (21) TBPK tablet, Take following package directions., Disp: 21 tablet, Rfl: 0   amoxicillin -clavulanate (AUGMENTIN ) 875-125 MG tablet, Take 1 tablet by mouth 2 (two) times daily for 7 days., Disp: 14 tablet, Rfl: 0   atorvastatin  (LIPITOR) 40 MG tablet, Take 1 tablet (40 mg total) by mouth daily., Disp: 90 tablet, Rfl: 3   benzonatate  (TESSALON ) 100 MG capsule, Take 1 capsule (100 mg total) by mouth 3 (three) times daily as needed for cough., Disp: 30 capsule, Rfl: 0   cetirizine  (ZYRTEC ) 10 MG tablet, Take 1 tablet (10 mg total) by mouth daily. (Patient taking differently: Take 10 mg by mouth daily as needed for allergies.), Disp: 30 tablet, Rfl: 11   diazepam  (VALIUM ) 5 MG tablet, Take one tablet by mouth with light food one hour prior to procedure., Disp: 1 tablet, Rfl: 0   gabapentin  (NEURONTIN ) 600 MG tablet, TAKE 2 TABLETS (1,200 MG TOTAL) BY MOUTH 3 (THREE) TIMES DAILY., Disp: 180 tablet, Rfl: 3   hydrOXYzine  (VISTARIL ) 25 MG capsule, Take 1 capsule (25 mg total) by mouth every 8 (eight) hours as needed., Disp: 30 capsule, Rfl: 5   ibuprofen  (ADVIL ) 800 MG tablet, TAKE 1 TABLET BY MOUTH 3 (THREE) TIMES DAILY AS NEEDED FOR MODERATE PAIN (PAIN SCORE 4-6)., Disp: 90  tablet, Rfl: 2   insulin  aspart (NOVOLOG ) 100 UNIT/ML FlexPen, Inject 10 Units into the skin 3 (three) times daily with meals., Disp: 12 mL, Rfl: 5   methocarbamol  (ROBAXIN ) 750 MG tablet, Take 1 tablet (750 mg total) by mouth every 8 (eight) hours as needed for muscle spasms., Disp: 60 tablet, Rfl: 2   metoprolol  succinate (TOPROL  XL) 25 MG 24 hr tablet, Take 0.5 tablets (12.5 mg total) by mouth daily., Disp: 45 tablet, Rfl: 2   montelukast  (SINGULAIR ) 10 MG tablet, Take 1 tablet (10 mg total) by mouth at bedtime., Disp: 30 tablet, Rfl: 5   MOUNJARO  10 MG/0.5ML Pen, INJECT 10MG  INTO THE SKIN ONCE A WEEK, Disp: 6 mL, Rfl: 1   pantoprazole  (PROTONIX ) 40 MG tablet, Take 1 tablet (40 mg total) by mouth daily., Disp: 30 tablet, Rfl: 5   SLYND 4 MG TABS, Take 1 tablet by mouth daily., Disp: , Rfl:    Medications ordered in this encounter:  Meds ordered this encounter  Medications   predniSONE  (STERAPRED UNI-PAK 21 TAB) 10 MG (21) TBPK tablet    Sig: Take following package directions.    Dispense:  21 tablet    Refill:  0    Please dispense one standard blister pack taper.     *If you need refills on other medications prior to your next appointment, please contact your pharmacy*  Follow-Up: Call back or seek an in-person evaluation if the symptoms worsen or if the condition fails to improve  as anticipated.  [facility] [phone]  Other Instructions Sinus Infection, Adult A sinus infection, also called sinusitis, is inflammation of your sinuses. Sinuses are hollow spaces in the bones around your face. Your sinuses are located: Around your eyes. In the middle of your forehead. Behind your nose. In your cheekbones. Mucus normally drains out of your sinuses. When your nasal tissues become inflamed or swollen, mucus can become trapped or blocked. This allows bacteria, viruses, and fungi to grow, which leads to infection. Most infections of the sinuses are caused by a virus. A  sinus infection can develop quickly. It can last for up to 4 weeks (acute) or for more than 12 weeks (chronic). A sinus infection often develops after a cold. What are the causes? This condition is caused by anything that creates swelling in the sinuses or stops mucus from draining. This includes: Allergies. Asthma. Infection from bacteria or viruses. Deformities or blockages in your nose or sinuses. Abnormal growths in the nose (nasal polyps). Pollutants, such as chemicals or irritants in the air. Infection from fungi. This is rare. What increases the risk? You are more likely to develop this condition if you: Have a weak body defense system (immune system). Do a lot of swimming or diving. Overuse nasal sprays. Smoke. What are the signs or symptoms? The main symptoms of this condition are pain and a feeling of pressure around the affected sinuses. Other symptoms include: Stuffy nose or congestion that makes it difficult to breathe through your nose. Thick yellow or greenish drainage from your nose. Tenderness, swelling, and warmth over the affected sinuses. A cough that may get worse at night. Decreased sense of smell and taste. Extra mucus that collects in the throat or the back of the nose (postnasal drip) causing a sore throat or bad breath. Tiredness (fatigue). Fever. How is this diagnosed? This condition is diagnosed based on: Your symptoms. Your medical history. A physical exam. Tests to find out if your condition is acute or chronic. This may include: Checking your nose for nasal polyps. Viewing your sinuses using a device that has a light (endoscope). Testing for allergies or bacteria. Imaging tests, such as an MRI or CT scan. In rare cases, a bone biopsy may be done to rule out more serious types of fungal sinus disease. How is this treated? Treatment for a sinus infection depends on the cause and whether your condition is chronic or acute. If caused by a virus, your  symptoms should go away on their own within 10 days. You may be given medicines to relieve symptoms. They include: Medicines that shrink swollen nasal passages (decongestants). A spray that eases inflammation of the nostrils (topical intranasal corticosteroids). Rinses that help get rid of thick mucus in your nose (nasal saline washes). Medicines that treat allergies (antihistamines). Over-the-counter pain relievers. If caused by bacteria, your health care provider may recommend waiting to see if your symptoms improve. Most bacterial infections will get better without antibiotic medicine. You may be given antibiotics if you have: A severe infection. A weak immune system. If caused by narrow nasal passages or nasal polyps, surgery may be needed. Follow these instructions at home: Medicines Take, use, or apply over-the-counter and prescription medicines only as told by your health care provider. These may include nasal sprays. If you were prescribed an antibiotic medicine, take it as told by your health care provider. Do not stop taking the antibiotic even if you start to feel better. Hydrate and humidify  Drink enough fluid to keep your urine pale yellow. Staying hydrated will help to thin your mucus. Use a cool mist humidifier to keep the humidity level in your home above 50%. Inhale steam for 10-15 minutes, 3-4 times a day, or as told by your health care provider. You can do this in the bathroom while a hot shower is running. Limit your exposure to cool or dry air. Rest Rest as much as possible. Sleep with your head raised (elevated). Make sure you get enough sleep each night. General instructions  Apply a warm, moist washcloth to your face 3-4 times a day or as told by your health care provider. This will help with discomfort. Use nasal saline washes as often as told by your health care provider. Wash your hands often with soap and water to reduce your exposure to germs. If soap and  water are not available, use hand sanitizer. Do not smoke. Avoid being around people who are smoking (secondhand smoke). Keep all follow-up visits. This is important. Contact a health care provider if: You have a fever. Your symptoms get worse. Your symptoms do not improve within 10 days. Get help right away if: You have a severe headache. You have persistent vomiting. You have severe pain or swelling around your face or eyes. You have vision problems. You develop confusion. Your neck is stiff. You have trouble breathing. These symptoms may be an emergency. Get help right away. Call 911. Do not wait to see if the symptoms will go away. Do not drive yourself to the hospital. Summary A sinus infection is soreness and inflammation of your sinuses. Sinuses are hollow spaces in the bones around your face. This condition is caused by nasal tissues that become inflamed or swollen. The swelling traps or blocks the flow of mucus. This allows bacteria, viruses, and fungi to grow, which leads to infection. If you were prescribed an antibiotic medicine, take it as told by your health care provider. Do not stop taking the antibiotic even if you start to feel better. Keep all follow-up visits. This is important. This information is not intended to replace advice given to you by your health care provider. Make sure you discuss any questions you have with your health care provider. Document Revised: 12/06/2020 Document Reviewed: 12/06/2020 Elsevier Patient Education  2024 [employer]   If you have been instructed to have an in-person evaluation today at a local Urgent Care facility, please use the link below. It will take you to a list of all of our available [facility], including address, phone number and hours of operation. Please do not delay care.  [facility]  If you or a family member do not have a primary care provider, use the link below to schedule a visit and  establish care. When you choose a [facility] primary care physician or advanced practice provider, you gain a long-term partner in health. Find a Primary Care Provider  Learn more about [facility]'s in-office and virtual care options: [facility] - Get Care Now

## 2023-11-23 NOTE — Progress Notes (Signed)
 Virtual Visit Consent   Samantha Blackburn, you are scheduled for a virtual visit with a [facility] provider today. Just as with appointments in the office, your consent must be obtained to participate. Your consent will be active for this visit and any virtual visit you may have with one of our providers in the next 365 days. If you have a MyChart account, a copy of this consent can be sent to you electronically.  As this is a virtual visit, video technology does not allow for your provider to perform a traditional examination. This may limit your provider's ability to fully assess your condition. If your provider identifies any concerns that need to be evaluated in person or the need to arrange testing (such as labs, EKG, etc.), we will make arrangements to do so. Although advances in technology are sophisticated, we cannot ensure that it will always work on either your end or our end. If the connection with a video visit is poor, the visit may have to be switched to a telephone visit. With either a video or telephone visit, we are not always able to ensure that we have a secure connection.  By engaging in this virtual visit, you consent to the provision of healthcare and authorize for your insurance to be billed (if applicable) for the services provided during this visit. Depending on your insurance coverage, you may receive a charge related to this service.  I need to obtain your verbal consent now. Are you willing to proceed with your visit today? Samantha Blackburn has provided verbal consent on 11/23/2023 for a virtual visit (video or telephone). Roosvelt Mater, [place]  Date: 11/23/2023 1:04 PM   Virtual Visit via Video Note   I, Roosvelt Mater, connected with  Samantha Blackburn  ([id number], 04-09-1980) on 11/23/23 at  1:00 PM EST by a video-enabled telemedicine application and verified that I am speaking with the correct person using two identifiers.  Location: Patient: Virtual Visit Location  Patient: Home Provider: Virtual Visit Location Provider: Home Office   I discussed the limitations of evaluation and management by telemedicine and the availability of in person appointments. The patient expressed understanding and agreed to proceed.    History of Present Illness: Samantha Blackburn is a 43 y.o. who identifies as a female who was assigned female at birth, and is being seen today for c/o doing a virtual visit on Tuesday and was started on Augmentin  for an upper respiratory condition. Pt states she is getting worse.  Pt states she would like to try something else.  Pt states she is very congested in her nose and it feels like she has a knot on her nose from the congestion. Pt states she is using Flonase  and it is not helping.   HPI: HPI  Problems:  Patient Active Problem List   Diagnosis Date Noted   Right carotid bruit 08/13/2023   Cervical radiculopathy 06/04/2023   Hyperglycemia 04/01/2023   Acute pansinusitis 04/01/2023   Gastroesophageal reflux disease 04/01/2023   Orthostatic hypotension 04/01/2023   Syncope and collapse 03/30/2023   Heart palpitations 12/10/2022   Easy bruising 12/10/2022   Anxiety 02/26/2022   Encounter for annual general medical examination with abnormal findings in adult 11/19/2021   Hyperlipidemia LDL goal <70 05/10/2021   Neuropathy 05/10/2021   Need for Tdap vaccination 05/10/2021   Hyponatremia 05/10/2021   Trichomonas infection 03/27/2021   Renal abscess 10/12/2020   Diabetes mellitus type 2, insulin  dependent (HCC) 09/26/2020  Abnormal physical evaluation 05/26/2020   Uncontrolled type 2 diabetes mellitus with hyperglycemia (HCC) 05/09/2020   Hypertension associated with diabetes (HCC) 05/09/2020   Disorder of thyroid  gland 02/21/2018   History of trichomoniasis 04/19/2016   Encounter to establish care 11/14/2015   Obesity 12/01/2012    Allergies:  Allergies  Allergen Reactions   Cymbalta [Duloxetine Hcl]     Can't take it    Morphine Other (See Comments)    Makes her feel weird.   Medications:  Current Outpatient Medications:    predniSONE  (STERAPRED UNI-PAK 21 TAB) 10 MG (21) TBPK tablet, Take following package directions., Disp: 21 tablet, Rfl: 0   amoxicillin -clavulanate (AUGMENTIN ) 875-125 MG tablet, Take 1 tablet by mouth 2 (two) times daily for 7 days., Disp: 14 tablet, Rfl: 0   atorvastatin  (LIPITOR) 40 MG tablet, Take 1 tablet (40 mg total) by mouth daily., Disp: 90 tablet, Rfl: 3   benzonatate  (TESSALON ) 100 MG capsule, Take 1 capsule (100 mg total) by mouth 3 (three) times daily as needed for cough., Disp: 30 capsule, Rfl: 0   cetirizine  (ZYRTEC ) 10 MG tablet, Take 1 tablet (10 mg total) by mouth daily. (Patient taking differently: Take 10 mg by mouth daily as needed for allergies.), Disp: 30 tablet, Rfl: 11   diazepam  (VALIUM ) 5 MG tablet, Take one tablet by mouth with light food one hour prior to procedure., Disp: 1 tablet, Rfl: 0   gabapentin  (NEURONTIN ) 600 MG tablet, TAKE 2 TABLETS (1,200 MG TOTAL) BY MOUTH 3 (THREE) TIMES DAILY., Disp: 180 tablet, Rfl: 3   hydrOXYzine  (VISTARIL ) 25 MG capsule, Take 1 capsule (25 mg total) by mouth every 8 (eight) hours as needed., Disp: 30 capsule, Rfl: 5   ibuprofen  (ADVIL ) 800 MG tablet, TAKE 1 TABLET BY MOUTH 3 (THREE) TIMES DAILY AS NEEDED FOR MODERATE PAIN (PAIN SCORE 4-6)., Disp: 90 tablet, Rfl: 2   insulin  aspart (NOVOLOG ) 100 UNIT/ML FlexPen, Inject 10 Units into the skin 3 (three) times daily with meals., Disp: 12 mL, Rfl: 5   methocarbamol  (ROBAXIN ) 750 MG tablet, Take 1 tablet (750 mg total) by mouth every 8 (eight) hours as needed for muscle spasms., Disp: 60 tablet, Rfl: 2   metoprolol  succinate (TOPROL  XL) 25 MG 24 hr tablet, Take 0.5 tablets (12.5 mg total) by mouth daily., Disp: 45 tablet, Rfl: 2   montelukast  (SINGULAIR ) 10 MG tablet, Take 1 tablet (10 mg total) by mouth at bedtime., Disp: 30 tablet, Rfl: 5   MOUNJARO  10 MG/0.5ML Pen, INJECT 10MG  INTO  THE SKIN ONCE A WEEK, Disp: 6 mL, Rfl: 1   pantoprazole  (PROTONIX ) 40 MG tablet, Take 1 tablet (40 mg total) by mouth daily., Disp: 30 tablet, Rfl: 5   SLYND 4 MG TABS, Take 1 tablet by mouth daily., Disp: , Rfl:   Observations/Objective: Patient is well-developed, well-nourished in no acute distress.  Resting comfortably at home.  Head is normocephalic, atraumatic.  No labored breathing.  Speech is clear and coherent with logical content.  Patient is alert and oriented at baseline.    Assessment and Plan: 1. Acute bacterial sinusitis (Primary) - predniSONE  (STERAPRED UNI-PAK 21 TAB) 10 MG (21) TBPK tablet; Take following package directions.  Dispense: 21 tablet; Refill: 0  -Start Steroid pack and continue antibiotics -Pt advised to return to urgent care if still no improvement with symptoms   Follow Up Instructions: I discussed the assessment and treatment plan with the patient. The patient was provided an opportunity to ask questions and all were  answered. The patient agreed with the plan and demonstrated an understanding of the instructions.  A copy of instructions were sent to the patient via MyChart unless otherwise noted below.    The patient was advised to call back or seek an in-person evaluation if the symptoms worsen or if the condition fails to improve as anticipated.    Roosvelt Mater, PA-C

## 2023-11-25 ENCOUNTER — Ambulatory Visit: Admitting: Neurology

## 2023-11-25 ENCOUNTER — Other Ambulatory Visit: Payer: Self-pay

## 2023-11-25 ENCOUNTER — Encounter: Payer: Self-pay | Admitting: Neurology

## 2023-11-25 VITALS — BP 147/83 | HR 102 | Ht 63.0 in | Wt 137.0 lb

## 2023-11-25 DIAGNOSIS — G629 Polyneuropathy, unspecified: Secondary | ICD-10-CM

## 2023-11-25 DIAGNOSIS — R2681 Unsteadiness on feet: Secondary | ICD-10-CM | POA: Diagnosis not present

## 2023-11-25 NOTE — Progress Notes (Signed)
 [facility] Clinic Note - Initial Visit   Date: 11/25/2023   Samantha Blackburn MRN: [id number] DOB: November 17, 1980   Dear [name], FNP:  Thank you for your kind referral of Samantha Blackburn for consultation of weakness. Although her history is well known to you, please allow us  to reiterate it for the purpose of our medical record. The patient was accompanied to the clinic by son who also provides collateral information.     [name] is a 43 y.o. right-handed female with insulin -dependent diabetes mellitus, hyperlipidemia, and GERD presenting for evaluation of generalized weakness and left arm tingling.   IMPRESSION/PLAN: Spells of bilateral leg buckling, generalized arm weakness, left arm radicular pain.  Neurological exam does not show any upper motor neuron findings.  Her reflexes are reduced in the ankles and otherwise exam is normal.  There is no focal weakness.  She may have early signs of neuropathy in the feet, but this would not explain her symptoms of weakness or upper limb pain.  To further investigate her symptoms, NCS/EMG of the left arm and leg will be ordered.  If electrodiagnostic testing is negative, proceed with MRI brain wwo contrast.     ------------------------------------------------------------- History of present illness: Starting in early May 2025, she began having left arm pain with shooting paresthesia down the left arm into the middle finger.  She has seen orthopeadics who ordered MRI cervical spine which shows mild canal stenosis at C5-6.  She had ESI which did not alleviate of her of her pain or paresthesias.  She reports having two falls where her legs gave out.  No preceding warning signs or triggers.  She occasionally has numbness in the legs.   Diabetes is well-controlled now with last HbS1c 5, whereas in May it was 15.  She also reports that her hearts are racing and her arms feel like they weight 50lb.  It occurs  sporadically, occurring daily for a week and then once in two weeks.  She had cardiac monitor which showed PVCs.  She works as a merchandiser, retail.    Out-side paper records, electronic medical record, and images have been reviewed where available and summarized as:  CT head 03/30/2023: Normal appearance of the brain. Generalized mucosal thickening in the paranasal sinuses.   MRI cervical spine wo contrast 08/07/2023: 1. Mild canal stenosis at C5-C6 secondary to disc osteophyte complex with central disc protrusion. Mild foraminal stenoses at several levels secondary to uncovertebral joint disease and facet arthropathy. 2. No signal abnormality of the cord.     Lab Results  Component Value Date   HGBA1C 5.2 09/02/2023   Lab Results  Component Value Date   VITAMINB12 389 12/10/2022   Lab Results  Component Value Date   TSH 0.755 03/30/2023    Past Medical History:  Diagnosis Date   Diabetes mellitus without complication (HCC)    Diabetes mellitus, new onset (HCC) 11/29/2012   Obesity 12/01/2012   Trichimoniasis     Past Surgical History:  Procedure Laterality Date   CHOLECYSTECTOMY       Medications:  Outpatient Encounter Medications as of 11/25/2023  Medication Sig Note   amoxicillin -clavulanate (AUGMENTIN ) 875-125 MG tablet Take 1 tablet by mouth 2 (two) times daily for 7 days.    atorvastatin  (LIPITOR) 40 MG tablet Take 1 tablet (40 mg total) by mouth daily.    gabapentin  (NEURONTIN ) 600 MG tablet TAKE 2 TABLETS (1,200 MG TOTAL) BY MOUTH 3 (THREE) TIMES DAILY.  hydrOXYzine  (VISTARIL ) 25 MG capsule Take 1 capsule (25 mg total) by mouth every 8 (eight) hours as needed.    ibuprofen  (ADVIL ) 800 MG tablet TAKE 1 TABLET BY MOUTH 3 (THREE) TIMES DAILY AS NEEDED FOR MODERATE PAIN (PAIN SCORE 4-6).    insulin  aspart (NOVOLOG ) 100 UNIT/ML FlexPen Inject 10 Units into the skin 3 (three) times daily with meals.    methocarbamol  (ROBAXIN ) 750 MG tablet Take 1 tablet (750 mg total)  by mouth every 8 (eight) hours as needed for muscle spasms.    metoprolol  succinate (TOPROL  XL) 25 MG 24 hr tablet Take 0.5 tablets (12.5 mg total) by mouth daily.    MOUNJARO  10 MG/0.5ML Pen INJECT 10MG  INTO THE SKIN ONCE A WEEK    pantoprazole  (PROTONIX ) 40 MG tablet Take 1 tablet (40 mg total) by mouth daily.    predniSONE  (STERAPRED UNI-PAK 21 TAB) 10 MG (21) TBPK tablet Take following package directions.    SLYND 4 MG TABS Take 1 tablet by mouth daily.    benzonatate  (TESSALON ) 100 MG capsule Take 1 capsule (100 mg total) by mouth 3 (three) times daily as needed for cough. (Patient not taking: Reported on 11/25/2023)    cetirizine  (ZYRTEC ) 10 MG tablet Take 1 tablet (10 mg total) by mouth daily. (Patient not taking: Reported on 11/25/2023) 03/31/2023: >30 days   diazepam  (VALIUM ) 5 MG tablet Take one tablet by mouth with light food one hour prior to procedure. (Patient not taking: Reported on 11/25/2023)    montelukast  (SINGULAIR ) 10 MG tablet Take 1 tablet (10 mg total) by mouth at bedtime. (Patient not taking: Reported on 11/25/2023)    No facility-administered encounter medications on file as of 11/25/2023.    Allergies:  Allergies  Allergen Reactions   Cymbalta [Duloxetine Hcl]     Can't take it   Morphine Other (See Comments)    Makes her feel weird.    Family History: Family History  Problem Relation Age of Onset   Hypertension Mother    Diabetes Mother    Cancer Father        lymphoma, kidney,bladder, liver,prostate   Diabetes Father    Heart attack Father    Diabetes Maternal Grandmother    Heart attack Maternal Grandfather    Stroke Paternal Grandmother     Social History: Social History   Tobacco Use   Smoking status: Former    Current packs/day: 0.00    Average packs/day: 1 pack/day for 10.0 years (10.0 ttl pk-yrs)    Types: Cigarettes    Start date: 05/09/2000    Quit date: 05/10/2010    Years since quitting: 13.5   Smokeless tobacco: Never  Vaping Use    Vaping status: Never Used  Substance Use Topics   Alcohol use: Yes    Comment: occ   Drug use: No   Social History   Social History Narrative   Are you right handed or left handed? Right   Are you currently employed ?    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   Who lives with you?family    What type of home do you live in: 1 story or 2 story? one   Caffiene  1 soda a day    Vital Signs:  BP (!) 147/83   Pulse (!) 102   Ht 5' 3 (1.6 m)   Wt 137 lb (62.1 kg)   SpO2 98%   BMI 24.27 kg/m  Neurological Exam: MENTAL STATUS including orientation to time, place, person, recent and remote memory, attention span and concentration, language, and fund of knowledge is normal.  Speech is not dysarthric.  CRANIAL NERVES: II:  No visual field defects.     III-IV-VI: Pupils equal round and reactive to light.  Normal conjugate, extra-ocular eye movements in all directions of gaze.  No nystagmus.  No ptosis.   V:  Normal facial sensation.    VII:  Normal facial symmetry and movements.   VIII:  Normal hearing and vestibular function.   IX-X:  Normal palatal movement.   XI:  Normal shoulder shrug and head rotation.   XII:  Normal tongue strength and range of motion, no deviation or fasciculation.  MOTOR:  No atrophy, fasciculations or abnormal movements.  No pronator drift.   Upper Extremity:  Right  Left  Deltoid  5/5   5/5   Biceps  5/5   5/5   Triceps  5/5   5/5   Wrist extensors  5/5   5/5   Wrist flexors  5/5   5/5   Finger extensors  5/5   5/5   Finger flexors  5/5   5/5   Dorsal interossei  5/5   5/5   Abductor pollicis  5/5   5/5   Tone (Ashworth scale)  0  0   Lower Extremity:  Right  Left  Hip flexors  5/5   5/5   Knee flexors  5/5   5/5   Knee extensors  5/5   5/5   Dorsiflexors  5/5   5/5   Plantarflexors  5/5   5/5   Toe extensors  5/5   5/5   Toe flexors  5/5   5/5   Tone (Ashworth scale)  0  0   MSRs:                                            Right        Left brachioradialis 2+  2+  biceps 2+  2+  triceps 2+  2+  patellar 2+  2+  ankle jerk 0  0  Hoffman no  no  plantar response down  down   SENSORY:  Normal and symmetric perception of light touch and temperature.  Vibration is diminished distally.   Romberg's sign present.   COORDINATION/GAIT: Normal finger-to- nose-finger.  Intact rapid alternating movements bilaterally.  Mild unsteadiness, unassisted.  Unsteady with stressed gait and tandem gait.     Thank you for allowing me to participate in patient's care.  If I can answer any additional questions, I would be pleased to do so.    Sincerely,    Darwin Rothlisberger K. Tobie, DO

## 2023-11-25 NOTE — Patient Instructions (Signed)
Nerve testing of the left arm and leg ° °ELECTROMYOGRAM AND NERVE CONDUCTION STUDIES (EMG/NCS) INSTRUCTIONS ° °How to Prepare °The neurologist conducting the EMG will need to know if you have certain medical conditions. Tell the neurologist and other EMG lab personnel if you: °Have a pacemaker or any other electrical medical device °Take blood-thinning medications °Have hemophilia, a blood-clotting disorder that causes prolonged bleeding °Bathing °Take a shower or bath shortly before your exam in order to remove oils from your skin. Don’t apply lotions or creams before the exam.  °What to Expect °You’ll likely be asked to change into a hospital gown for the procedure and lie down on an examination table. The following explanations can help you understand what will happen during the exam.  °Electrodes. The neurologist or a technician places surface electrodes at various locations on your skin depending on where you’re experiencing symptoms. Or the neurologist may insert needle electrodes at different sites depending on your symptoms.  °Sensations. The electrodes will at times transmit a tiny electrical current that you may feel as a twinge or spasm. The needle electrode may cause discomfort or pain that usually ends shortly after the needle is removed. °If you are concerned about discomfort or pain, you may want to talk to the neurologist about taking a short break during the exam.  °Instructions. During the needle EMG, the neurologist will assess whether there is any spontaneous electrical activity when the muscle is at rest - activity that isn’t present in healthy muscle tissue - and the degree of activity when you slightly contract the muscle.  °He or she will give you instructions on resting and contracting a muscle at appropriate times. Depending on what muscles and nerves the neurologist is examining, he or she may ask you to change positions during the exam.  °After your EMG °You may experience some temporary,  minor bruising where the needle electrode was inserted into your muscle. This bruising should fade within several days. If it persists, contact your primary care doctor.  ° °

## 2023-11-27 ENCOUNTER — Telehealth: Admitting: Physician Assistant

## 2023-11-27 DIAGNOSIS — B9689 Other specified bacterial agents as the cause of diseases classified elsewhere: Secondary | ICD-10-CM | POA: Diagnosis not present

## 2023-11-27 DIAGNOSIS — J019 Acute sinusitis, unspecified: Secondary | ICD-10-CM | POA: Diagnosis not present

## 2023-11-27 MED ORDER — PROMETHAZINE-DM 6.25-15 MG/5ML PO SYRP: 5.0000 mL | ORAL_SOLUTION | Freq: Four times a day (QID) | ORAL | 0 refills | Status: AC | PRN

## 2023-11-27 MED ORDER — ALBUTEROL SULFATE HFA 108 (90 BASE) MCG/ACT IN AERS: 2.0000 | INHALATION_SPRAY | Freq: Four times a day (QID) | RESPIRATORY_TRACT | 0 refills | Status: DC | PRN

## 2023-11-27 MED ORDER — DOXYCYCLINE HYCLATE 100 MG PO TABS: 100.0000 mg | ORAL_TABLET | Freq: Two times a day (BID) | ORAL | 0 refills | Status: DC

## 2023-11-27 NOTE — Patient Instructions (Signed)
 Samantha Blackburn, thank you for joining [name], PA-C for today's virtual visit.  While this provider is not your primary care provider (PCP), if your PCP is located in our provider database this encounter information will be shared with them immediately following your visit.   A [facility] MyChart account gives you access to today's visit and all your visits, tests, and labs performed at [facility]  click here if you don't have a [facility] MyChart account or go to [email or web address]  Consent: (Patient) Samantha Blackburn provided verbal consent for this virtual visit at the beginning of the encounter.  Current Medications:  Current Outpatient Medications:    albuterol  (VENTOLIN  HFA) 108 (90 Base) MCG/ACT inhaler, Inhale 2 puffs into the lungs every 6 (six) hours as needed for wheezing or shortness of breath., Disp: 8 g, Rfl: 0   doxycycline  (VIBRA -TABS) 100 MG tablet, Take 1 tablet (100 mg total) by mouth 2 (two) times daily., Disp: 14 tablet, Rfl: 0   promethazine -dextromethorphan  (PROMETHAZINE -DM) 6.25-15 MG/5ML syrup, Take 5 mLs by mouth 4 (four) times daily as needed for cough., Disp: 118 mL, Rfl: 0   atorvastatin  (LIPITOR) 40 MG tablet, Take 1 tablet (40 mg total) by mouth daily., Disp: 90 tablet, Rfl: 3   benzonatate  (TESSALON ) 100 MG capsule, Take 1 capsule (100 mg total) by mouth 3 (three) times daily as needed for cough. (Patient not taking: Reported on 11/25/2023), Disp: 30 capsule, Rfl: 0   cetirizine  (ZYRTEC ) 10 MG tablet, Take 1 tablet (10 mg total) by mouth daily. (Patient not taking: Reported on 11/25/2023), Disp: 30 tablet, Rfl: 11   diazepam  (VALIUM ) 5 MG tablet, Take one tablet by mouth with light food one hour prior to procedure. (Patient not taking: Reported on 11/25/2023), Disp: 1 tablet, Rfl: 0   gabapentin  (NEURONTIN ) 600 MG tablet, TAKE 2 TABLETS (1,200 MG TOTAL) BY MOUTH 3 (THREE) TIMES DAILY., Disp: 180 tablet, Rfl: 3   hydrOXYzine   (VISTARIL ) 25 MG capsule, Take 1 capsule (25 mg total) by mouth every 8 (eight) hours as needed., Disp: 30 capsule, Rfl: 5   ibuprofen  (ADVIL ) 800 MG tablet, TAKE 1 TABLET BY MOUTH 3 (THREE) TIMES DAILY AS NEEDED FOR MODERATE PAIN (PAIN SCORE 4-6)., Disp: 90 tablet, Rfl: 2   insulin  aspart (NOVOLOG ) 100 UNIT/ML FlexPen, Inject 10 Units into the skin 3 (three) times daily with meals., Disp: 12 mL, Rfl: 5   methocarbamol  (ROBAXIN ) 750 MG tablet, Take 1 tablet (750 mg total) by mouth every 8 (eight) hours as needed for muscle spasms., Disp: 60 tablet, Rfl: 2   metoprolol  succinate (TOPROL  XL) 25 MG 24 hr tablet, Take 0.5 tablets (12.5 mg total) by mouth daily., Disp: 45 tablet, Rfl: 2   montelukast  (SINGULAIR ) 10 MG tablet, Take 1 tablet (10 mg total) by mouth at bedtime. (Patient not taking: Reported on 11/25/2023), Disp: 30 tablet, Rfl: 5   MOUNJARO  10 MG/0.5ML Pen, INJECT 10MG  INTO THE SKIN ONCE A WEEK, Disp: 6 mL, Rfl: 1   pantoprazole  (PROTONIX ) 40 MG tablet, Take 1 tablet (40 mg total) by mouth daily., Disp: 30 tablet, Rfl: 5   predniSONE  (STERAPRED UNI-PAK 21 TAB) 10 MG (21) TBPK tablet, Take following package directions., Disp: 21 tablet, Rfl: 0   SLYND 4 MG TABS, Take 1 tablet by mouth daily., Disp: , Rfl:    Medications ordered in this encounter:  Meds ordered this encounter  Medications   promethazine -dextromethorphan  (PROMETHAZINE -DM) 6.25-15 MG/5ML syrup    Sig: Take 5  mLs by mouth 4 (four) times daily as needed for cough.    Dispense:  118 mL    Refill:  0    Supervising Provider:   LAMPTEY, PHILIP O [[id number]]   albuterol  (VENTOLIN  HFA) 108 (90 Base) MCG/ACT inhaler    Sig: Inhale 2 puffs into the lungs every 6 (six) hours as needed for wheezing or shortness of breath.    Dispense:  8 g    Refill:  0    Supervising Provider:   BLAISE ALEENE KIDD [id number]   doxycycline  (VIBRA -TABS) 100 MG tablet    Sig: Take 1 tablet (100 mg total) by mouth 2 (two) times daily.    Dispense:  14  tablet    Refill:  0    Supervising Provider:   BLAISE ALEENE KIDD [[id number]]     *If you need refills on other medications prior to your next appointment, please contact your pharmacy*  Follow-Up: Call back or seek an in-person evaluation if the symptoms worsen or if the condition fails to improve as anticipated.  [facility] [phone]  Other Instructions Please hydrate and rest. Start a saline nasal rinse. Ok to continue your OTC medications. Take the antibiotic and cough syrup as directed. The inhaler will also be beneficial for episodes of coughing.  If you note any non-resolving, new, or worsening symptoms despite treatment, please seek an in-person evaluation ASAP.    If you have been instructed to have an in-person evaluation today at a local Urgent Care facility, please use the link below. It will take you to a list of all of our available [facility], including address, phone number and hours of operation. Please do not delay care.  [facility]  If you or a family member do not have a primary care provider, use the link below to schedule a visit and establish care. When you choose a [facility] primary care physician or advanced practice provider, you gain a long-term partner in health. Find a Primary Care Provider  Learn more about [facility]'s in-office and virtual care options: [facility] - Get Care Now

## 2023-11-27 NOTE — Progress Notes (Signed)
 Virtual Visit Consent   [place] LUVINA POIRIER, you are scheduled for a virtual visit with a [facility] provider today. Just as with appointments in the office, your consent must be obtained to participate. Your consent will be active for this visit and any virtual visit you may have with one of our providers in the next 365 days. If you have a MyChart account, a copy of this consent can be sent to you electronically.  As this is a virtual visit, video technology does not allow for your provider to perform a traditional examination. This may limit your provider's ability to fully assess your condition. If your provider identifies any concerns that need to be evaluated in person or the need to arrange testing (such as labs, EKG, etc.), we will make arrangements to do so. Although advances in technology are sophisticated, we cannot ensure that it will always work on either your end or our end. If the connection with a video visit is poor, the visit may have to be switched to a telephone visit. With either a video or telephone visit, we are not always able to ensure that we have a secure connection.  By engaging in this virtual visit, you consent to the provision of healthcare and authorize for your insurance to be billed (if applicable) for the services provided during this visit. Depending on your insurance coverage, you may receive a charge related to this service.  I need to obtain your verbal consent now. Are you willing to proceed with your visit today? Irvin LUCIELLE VOKES has provided verbal consent on 11/27/2023 for a virtual visit (video or telephone). [name], [place]  Date: 11/27/2023 2:21 PM   Virtual Visit via Video Note   I, [name], connected with  CHELSIE BUREL  ([id number], August 12, 1980) on 11/27/23 at  2:15 PM EST by a video-enabled telemedicine application and verified that I am speaking with the correct person using two identifiers.  Location: Patient: Virtual Visit  Location Patient: Home Provider: Virtual Visit Location Provider: Home Office   I discussed the limitations of evaluation and management by telemedicine and the availability of in person appointments. The patient expressed understanding and agreed to proceed.    History of Present Illness: Ikeya EXILDA WILHITE is a 43 y.o. who identifies as a female who was assigned female at birth, and is being seen today for continued and progressive sinus pressure, congestion, sinus pain and deep cough with chest congestion, despite treatment with course of Augmentin  and prednisone . Denies fever, chills. Has been taking OTC Sudafed which helps some. Denies recent travel or sick contact. Has taken multiple COVID tests as a precaution -- all negative.   HPI: HPI  Problems:  Patient Active Problem List   Diagnosis Date Noted   Right carotid bruit 08/13/2023   Cervical radiculopathy 06/04/2023   Hyperglycemia 04/01/2023   Acute pansinusitis 04/01/2023   Gastroesophageal reflux disease 04/01/2023   Orthostatic hypotension 04/01/2023   Syncope and collapse 03/30/2023   Heart palpitations 12/10/2022   Easy bruising 12/10/2022   Anxiety 02/26/2022   Encounter for annual general medical examination with abnormal findings in adult 11/19/2021   Hyperlipidemia LDL goal <70 05/10/2021   Neuropathy 05/10/2021   Need for Tdap vaccination 05/10/2021   Hyponatremia 05/10/2021   Trichomonas infection 03/27/2021   Renal abscess 10/12/2020   Diabetes mellitus type 2, insulin  dependent (HCC) 09/26/2020   Abnormal physical evaluation 05/26/2020   Uncontrolled type 2 diabetes mellitus with hyperglycemia (HCC) 05/09/2020  Hypertension associated with diabetes (HCC) 05/09/2020   Disorder of thyroid  gland 02/21/2018   History of trichomoniasis 04/19/2016   Encounter to establish care 11/14/2015   Obesity 12/01/2012    Allergies:  Allergies  Allergen Reactions   Cymbalta [Duloxetine Hcl]     Can't take it    Morphine Other (See Comments)    Makes her feel weird.   Medications:  Current Outpatient Medications:    albuterol  (VENTOLIN  HFA) 108 (90 Base) MCG/ACT inhaler, Inhale 2 puffs into the lungs every 6 (six) hours as needed for wheezing or shortness of breath., Disp: 8 g, Rfl: 0   doxycycline  (VIBRA -TABS) 100 MG tablet, Take 1 tablet (100 mg total) by mouth 2 (two) times daily., Disp: 14 tablet, Rfl: 0   promethazine -dextromethorphan  (PROMETHAZINE -DM) 6.25-15 MG/5ML syrup, Take 5 mLs by mouth 4 (four) times daily as needed for cough., Disp: 118 mL, Rfl: 0   atorvastatin  (LIPITOR) 40 MG tablet, Take 1 tablet (40 mg total) by mouth daily., Disp: 90 tablet, Rfl: 3   benzonatate  (TESSALON ) 100 MG capsule, Take 1 capsule (100 mg total) by mouth 3 (three) times daily as needed for cough. (Patient not taking: Reported on 11/25/2023), Disp: 30 capsule, Rfl: 0   cetirizine  (ZYRTEC ) 10 MG tablet, Take 1 tablet (10 mg total) by mouth daily. (Patient not taking: Reported on 11/25/2023), Disp: 30 tablet, Rfl: 11   diazepam  (VALIUM ) 5 MG tablet, Take one tablet by mouth with light food one hour prior to procedure. (Patient not taking: Reported on 11/25/2023), Disp: 1 tablet, Rfl: 0   gabapentin  (NEURONTIN ) 600 MG tablet, TAKE 2 TABLETS (1,200 MG TOTAL) BY MOUTH 3 (THREE) TIMES DAILY., Disp: 180 tablet, Rfl: 3   hydrOXYzine  (VISTARIL ) 25 MG capsule, Take 1 capsule (25 mg total) by mouth every 8 (eight) hours as needed., Disp: 30 capsule, Rfl: 5   ibuprofen  (ADVIL ) 800 MG tablet, TAKE 1 TABLET BY MOUTH 3 (THREE) TIMES DAILY AS NEEDED FOR MODERATE PAIN (PAIN SCORE 4-6)., Disp: 90 tablet, Rfl: 2   insulin  aspart (NOVOLOG ) 100 UNIT/ML FlexPen, Inject 10 Units into the skin 3 (three) times daily with meals., Disp: 12 mL, Rfl: 5   methocarbamol  (ROBAXIN ) 750 MG tablet, Take 1 tablet (750 mg total) by mouth every 8 (eight) hours as needed for muscle spasms., Disp: 60 tablet, Rfl: 2   metoprolol  succinate (TOPROL  XL) 25 MG  24 hr tablet, Take 0.5 tablets (12.5 mg total) by mouth daily., Disp: 45 tablet, Rfl: 2   montelukast  (SINGULAIR ) 10 MG tablet, Take 1 tablet (10 mg total) by mouth at bedtime. (Patient not taking: Reported on 11/25/2023), Disp: 30 tablet, Rfl: 5   MOUNJARO  10 MG/0.5ML Pen, INJECT 10MG  INTO THE SKIN ONCE A WEEK, Disp: 6 mL, Rfl: 1   pantoprazole  (PROTONIX ) 40 MG tablet, Take 1 tablet (40 mg total) by mouth daily., Disp: 30 tablet, Rfl: 5   predniSONE  (STERAPRED UNI-PAK 21 TAB) 10 MG (21) TBPK tablet, Take following package directions., Disp: 21 tablet, Rfl: 0   SLYND 4 MG TABS, Take 1 tablet by mouth daily., Disp: , Rfl:   Observations/Objective: Patient is well-developed, well-nourished in no acute distress.  Resting comfortably  at home.  Head is normocephalic, atraumatic.  No labored breathing.  Speech is clear and coherent with logical content.  Patient is alert and oriented at baseline.   Assessment and Plan: 1. Acute bacterial sinusitis (Primary) - promethazine -dextromethorphan  (PROMETHAZINE -DM) 6.25-15 MG/5ML syrup; Take 5 mLs by mouth 4 (four) times daily as  needed for cough.  Dispense: 118 mL; Refill: 0 - albuterol  (VENTOLIN  HFA) 108 (90 Base) MCG/ACT inhaler; Inhale 2 puffs into the lungs every 6 (six) hours as needed for wheezing or shortness of breath.  Dispense: 8 g; Refill: 0 - doxycycline  (VIBRA -TABS) 100 MG tablet; Take 1 tablet (100 mg total) by mouth 2 (two) times daily.  Dispense: 14 tablet; Refill: 0  Rx Doxycycline .  Increase fluids.  Rest.  Saline nasal spray.  Probiotic.  Mucinex as directed.  Humidifier in bedroom. Promethazine -DM and Albuterol  HFA per orders. At this point, if not improving over next few days and continuing to improve until resolving, she will require in-person evaluation for exam and possible imaging.    Follow Up Instructions: I discussed the assessment and treatment plan with the patient. The patient was provided an opportunity to ask questions and  all were answered. The patient agreed with the plan and demonstrated an understanding of the instructions.  A copy of instructions were sent to the patient via MyChart unless otherwise noted below.    The patient was advised to call back or seek an in-person evaluation if the symptoms worsen or if the condition fails to improve as anticipated.    [name], PA-C

## 2023-12-04 ENCOUNTER — Ambulatory Visit (INDEPENDENT_AMBULATORY_CARE_PROVIDER_SITE_OTHER)

## 2023-12-04 VITALS — BP 128/81 | HR 108 | Ht 63.0 in | Wt 134.0 lb

## 2023-12-04 DIAGNOSIS — R052 Subacute cough: Secondary | ICD-10-CM

## 2023-12-04 DIAGNOSIS — K219 Gastro-esophageal reflux disease without esophagitis: Secondary | ICD-10-CM | POA: Diagnosis not present

## 2023-12-04 DIAGNOSIS — E1165 Type 2 diabetes mellitus with hyperglycemia: Secondary | ICD-10-CM | POA: Diagnosis not present

## 2023-12-04 DIAGNOSIS — Z794 Long term (current) use of insulin: Secondary | ICD-10-CM

## 2023-12-04 MED ORDER — PANTOPRAZOLE SODIUM 40 MG PO TBEC: 40.0000 mg | DELAYED_RELEASE_TABLET | Freq: Every day | ORAL | 3 refills | Status: AC

## 2023-12-04 MED ORDER — FLUCONAZOLE 150 MG PO TABS: 150.0000 mg | ORAL_TABLET | Freq: Once | ORAL | 0 refills | Status: AC

## 2023-12-04 MED ORDER — INSULIN ASPART 100 UNIT/ML FLEXPEN: 10.0000 [IU] | PEN_INJECTOR | Freq: Three times a day (TID) | SUBCUTANEOUS | 5 refills | Status: AC

## 2023-12-04 MED ORDER — TIRZEPATIDE 12.5 MG/0.5ML ~~LOC~~ SOAJ: 12.5000 mg | SUBCUTANEOUS | 2 refills | Status: AC

## 2023-12-04 NOTE — Progress Notes (Signed)
 Established Patient Office Visit  Subjective   Patient ID: Samantha Blackburn, female    DOB: Feb 28, 1980  Age: 43 y.o. MRN: [id number]  Chief Complaint  Patient presents with   Medical Management of Chronic Issues    Office Visit     HPI Discussed the use of AI scribe software for clinical note transcription with the patient, who gave verbal consent to proceed.  History of Present Illness    Samantha Blackburn is a 43 year old female who presents with persistent respiratory symptoms despite antibiotic treatment.  Respiratory symptoms - Severe nasal congestion and cough persisting for two weeks - Cough has shown some improvement with cough medicine - Nasal symptoms persist despite treatment with amoxicillin  and doxycycline  - No facial tenderness - Sore inside nose at site of previous knot - No current flu symptoms  Dyspnea and cardiopulmonary symptoms - Significant shortness of breath and lightheadedness upon exertion - Episodes of palpitations and sensation of hyperventilation - Near-syncope at work requiring her to sit down during episodes  Medication use and adverse effects - Currently taking Lipitor, Mounjaro , Novolog , metoprolol , and Protonix ; requires refills - Approximately two days remaining of doxycycline  - Antibiotics tend to cause yeast infections  Over-the-counter medication response - Sudafed provides some relief of symptoms - Liquid Mucinex was ineffective  Past respiratory history - History of severe bronchitis as a child, no recent episodes     Patient Active Problem List   Diagnosis Date Noted   Subacute cough 12/06/2023   Right carotid bruit 08/13/2023   Cervical radiculopathy 06/04/2023   Hyperglycemia 04/01/2023   Acute pansinusitis 04/01/2023   Gastroesophageal reflux disease 04/01/2023   Orthostatic hypotension 04/01/2023   Syncope and collapse 03/30/2023   Heart palpitations 12/10/2022   Easy bruising 12/10/2022   Anxiety 02/26/2022    Encounter for annual general medical examination with abnormal findings in adult 11/19/2021   Hyperlipidemia LDL goal <70 05/10/2021   Neuropathy 05/10/2021   Need for Tdap vaccination 05/10/2021   Hyponatremia 05/10/2021   Trichomonas infection 03/27/2021   Renal abscess 10/12/2020   Diabetes mellitus type 2, insulin  dependent (HCC) 09/26/2020   Abnormal physical evaluation 05/26/2020   Uncontrolled type 2 diabetes mellitus with hyperglycemia (HCC) 05/09/2020   Hypertension associated with diabetes (HCC) 05/09/2020   Disorder of thyroid  gland 02/21/2018   History of trichomoniasis 04/19/2016   Encounter to establish care 11/14/2015   Obesity 12/01/2012    ROS    Objective:     BP 128/81   Pulse (!) 108   Ht 5' 3 (1.6 m)   Wt 134 lb (60.8 kg)   SpO2 98%   BMI 23.74 kg/m  BP Readings from Last 3 Encounters:  12/04/23 128/81  11/25/23 (!) 147/83  09/09/23 (!) 153/95   Wt Readings from Last 3 Encounters:  12/04/23 134 lb (60.8 kg)  11/25/23 137 lb (62.1 kg)  09/02/23 147 lb 0.6 oz (66.7 kg)      Physical Exam Vitals and nursing note reviewed.  Constitutional:      Appearance: Normal appearance.  HENT:     Head: Normocephalic.  Eyes:     Extraocular Movements: Extraocular movements intact.     Pupils: Pupils are equal, round, and reactive to light.  Cardiovascular:     Rate and Rhythm: Normal rate and regular rhythm.  Pulmonary:     Effort: Pulmonary effort is normal.     Breath sounds: Normal breath sounds.  Musculoskeletal:     Cervical back:  Normal range of motion and neck supple.  Neurological:     Mental Status: She is alert and oriented to person, place, and time.  Psychiatric:        Mood and Affect: Mood normal.        Thought Content: Thought content normal.      No results found for any visits on 12/04/23.    The 10-year ASCVD risk score (Arnett DK, et al., 2019) is: 1.1%    Assessment & Plan:   Problem List Items Addressed This Visit        Digestive   Gastroesophageal reflux disease   Stable with current dose of Protonix .  Refills provided      Relevant Medications   pantoprazole  (PROTONIX ) 40 MG tablet     Endocrine   Uncontrolled type 2 diabetes mellitus with hyperglycemia (HCC) - Primary   Type 2 diabetes managed with Mounjaro  and Novolog . No side effects with Mounjaro . Discussed increasing Mounjaro  dose. - Refilled Mounjaro  and Novolog . - Discussed potential Mounjaro  dose increase..      Relevant Medications   tirzepatide  (MOUNJARO ) 12.5 MG/0.5ML Pen   insulin  aspart (NOVOLOG ) 100 UNIT/ML FlexPen     Other   Subacute cough   Persistent cough and dyspnea for two weeks, likely bronchitis. Cough improved, dyspnea persists. Avoiding additional steroids due to blood sugar concerns. - Ordered chest x-ray. - Advised completion of doxycycline . - Recommended Mucinex or guaifenesin for cough.      Relevant Orders   DG Chest 2 View    Return in about 4 months (around 04/02/2024) for chronic follow-up with PCP.    [name], FNP

## 2023-12-06 DIAGNOSIS — R052 Subacute cough: Secondary | ICD-10-CM | POA: Insufficient documentation

## 2023-12-06 NOTE — Assessment & Plan Note (Signed)
 Persistent cough and dyspnea for two weeks, likely bronchitis. Cough improved, dyspnea persists. Avoiding additional steroids due to blood sugar concerns. - Ordered chest x-ray. - Advised completion of doxycycline . - Recommended Mucinex or guaifenesin for cough.

## 2023-12-06 NOTE — Assessment & Plan Note (Signed)
 Type 2 diabetes managed with Mounjaro  and Novolog . No side effects with Mounjaro . Discussed increasing Mounjaro  dose. - Refilled Mounjaro  and Novolog . - Discussed potential Mounjaro  dose increase.SABRA

## 2023-12-06 NOTE — Assessment & Plan Note (Signed)
 Stable with current dose of Protonix .  Refills provided

## 2023-12-15 ENCOUNTER — Other Ambulatory Visit: Payer: Self-pay

## 2024-01-15 ENCOUNTER — Ambulatory Visit: Payer: Self-pay

## 2024-01-15 NOTE — Telephone Encounter (Signed)
 Message from [place] E sent at 01/15/2024 12:28 PM EST  Summary: Positive Flu A   Reason for Triage: Tested positive for Flu A, says she is immunocompromised and needs tamiflu. Please advise. Has tried to be seen at urgent care but was told that nothing is available  Best contact: [phone]

## 2024-01-16 ENCOUNTER — Telehealth: Admitting: Physician Assistant

## 2024-01-16 DIAGNOSIS — J101 Influenza due to other identified influenza virus with other respiratory manifestations: Secondary | ICD-10-CM

## 2024-01-16 MED ORDER — OSELTAMIVIR PHOSPHATE 75 MG PO CAPS: 75.0000 mg | ORAL_CAPSULE | Freq: Two times a day (BID) | ORAL | 0 refills | Status: AC

## 2024-01-16 MED ORDER — BENZONATATE 100 MG PO CAPS: 100.0000 mg | ORAL_CAPSULE | Freq: Two times a day (BID) | ORAL | 0 refills | Status: AC | PRN

## 2024-01-16 NOTE — Patient Instructions (Signed)
 " Samantha Blackburn, thank you for joining [name], PA-C for today's virtual visit.  While this provider is not your primary care provider (PCP), if your PCP is located in our provider database this encounter information will be shared with them immediately following your visit.   A [facility] MyChart account gives you access to today's visit and all your visits, tests, and labs performed at [facility]  click here if you don't have a [facility] MyChart account or go to [email or web address]  Consent: (Patient) Samantha Blackburn provided verbal consent for this virtual visit at the beginning of the encounter.  Current Medications:  Current Outpatient Medications:    atorvastatin  (LIPITOR) 40 MG tablet, Take 1 tablet (40 mg total) by mouth daily., Disp: 90 tablet, Rfl: 3   gabapentin  (NEURONTIN ) 600 MG tablet, TAKE 2 TABLETS (1,200 MG TOTAL) BY MOUTH 3 (THREE) TIMES DAILY., Disp: 180 tablet, Rfl: 3   hydrOXYzine  (VISTARIL ) 25 MG capsule, Take 1 capsule (25 mg total) by mouth every 8 (eight) hours as needed., Disp: 30 capsule, Rfl: 5   ibuprofen  (ADVIL ) 800 MG tablet, TAKE 1 TABLET BY MOUTH 3 (THREE) TIMES DAILY AS NEEDED FOR MODERATE PAIN (PAIN SCORE 4-6)., Disp: 90 tablet, Rfl: 2   insulin  aspart (NOVOLOG ) 100 UNIT/ML FlexPen, Inject 10 Units into the skin 3 (three) times daily with meals., Disp: 12 mL, Rfl: 5   methocarbamol  (ROBAXIN ) 750 MG tablet, Take 1 tablet (750 mg total) by mouth every 8 (eight) hours as needed for muscle spasms., Disp: 60 tablet, Rfl: 2   metoprolol  succinate (TOPROL  XL) 25 MG 24 hr tablet, Take 0.5 tablets (12.5 mg total) by mouth daily., Disp: 45 tablet, Rfl: 2   pantoprazole  (PROTONIX ) 40 MG tablet, Take 1 tablet (40 mg total) by mouth daily., Disp: 90 tablet, Rfl: 3   promethazine -dextromethorphan  (PROMETHAZINE -DM) 6.25-15 MG/5ML syrup, Take 5 mLs by mouth 4 (four) times daily as needed for cough., Disp: 118 mL, Rfl: 0   SLYND 4 MG TABS,  Take 1 tablet by mouth daily., Disp: , Rfl:    tirzepatide  (MOUNJARO ) 12.5 MG/0.5ML Pen, Inject 12.5 mg into the skin once a week., Disp: 6 mL, Rfl: 2   Medications ordered in this encounter:  No orders of the defined types were placed in this encounter.    *If you need refills on other medications prior to your next appointment, please contact your pharmacy*  Follow-Up: Call back or seek an in-person evaluation if the symptoms worsen or if the condition fails to improve as anticipated.  [facility] [phone]  Other Instructions Please keep well-hydrated and try to get plenty of rest. If you have a humidifier, place it in the bedroom and run it at night. Start a saline nasal rinse for nasal congestion. You can consider use of a nasal steroid spray like Flonase  or Nasacort  OTC. You can alternate between Tylenol  and Ibuprofen  if needed for fever, body aches, headache and/or throat pain. Salt water-gargles and chloraseptic spray can be very beneficial for sore throat. Mucinex-DM for congestion or cough. Please take all prescribed medications as directed.  Remain out of work until [employer] for 24 hours without a fever-reducing medication, and you are feeling better.  You should mask until symptoms are resolved.  If anything worsens despite treatment, you need to be evaluated in-person. Please do not delay care.  Influenza, Adult Influenza is also called the flu. It is an infection in the lungs, nose, and throat (respiratory  tract). It spreads easily from person to person (is contagious). The flu causes symptoms that are like a cold, along with high fever and body aches. What are the causes? This condition is caused by the influenza virus. You can get the virus by: Breathing in droplets that are in the air after a person infected with the flu coughed or sneezed. Touching something that has the virus on it and then touching your mouth, nose, or eyes. What increases the  risk? Certain things may make you more likely to get the flu. These include: Not washing your hands often. Having close contact with many people during cold and flu season. Touching your mouth, eyes, or nose without first washing your hands. Not getting a flu shot every year. You may have a higher risk for the flu, and serious problems, such as a lung infection (pneumonia), if you: Are older than 65. Are pregnant. Have a weakened disease-fighting system (immune system) because of a disease or because you are taking certain medicines. Have a long-term (chronic) condition, such as: Heart, kidney, or lung disease. Diabetes. Asthma. Have a liver disorder. Are very overweight (morbidly obese). Have anemia. What are the signs or symptoms? Symptoms usually begin suddenly and last 4-14 days. They may include: Fever and chills. Headaches, body aches, or muscle aches. Sore throat. Cough. Runny or stuffy (congested) nose. Feeling discomfort in your chest. Not wanting to eat as much as normal. Feeling weak or tired. Feeling dizzy. Feeling sick to your stomach or throwing up. How is this treated? If the flu is found early, you can be treated with antiviral medicine. This can help to reduce how bad the illness is and how long it lasts. This may be given by mouth or through an IV tube. Taking care of yourself at home can help your symptoms get better. Your doctor may want you to: Take over-the-counter medicines. Drink plenty of fluids. The flu often goes away on its own. If you have very bad symptoms or other problems, you may be treated in a hospital. Follow these instructions at home:     Activity Rest as needed. Get plenty of sleep. Stay home from work or school as told by your doctor. Do not leave home until you do not have a fever for 24 hours without taking medicine. Leave home only to go to your doctor. Eating and drinking Take an ORS (oral rehydration solution). This is a drink  that is sold at pharmacies and stores. Drink enough fluid to keep your pee pale yellow. Drink clear fluids in small amounts as you are able. Clear fluids include: Water. Ice chips. Fruit juice mixed with water. Low-calorie sports drinks. Eat bland foods that are easy to digest. Eat small amounts as you are able. These foods include: Bananas. Applesauce. Rice. Lean meats. Toast. Crackers. Do not eat or drink: Fluids that have a lot of sugar or caffeine. Alcohol. Spicy or fatty foods. General instructions Take over-the-counter and prescription medicines only as told by your doctor. Use a cool mist humidifier to add moisture to the air in your home. This can make it easier for you to breathe. When using a cool mist humidifier, clean it daily. Empty water and replace with clean water. Cover your mouth and nose when you cough or sneeze. Wash your hands with soap and water often and for at least 20 seconds. This is also important after you cough or sneeze. If you cannot use soap and water, use alcohol-based hand sanitizer. Keep  all follow-up visits. How is this prevented?  Get a flu shot every year. You may get the flu shot in late summer, fall, or winter. Ask your doctor when you should get your flu shot. Avoid contact with people who are sick during fall and winter. This is cold and flu season. Contact a doctor if: You get new symptoms. You have: Chest pain. Watery poop (diarrhea). A fever. Your cough gets worse. You start to have more mucus. You feel sick to your stomach. You throw up. Get help right away if you: Have shortness of breath. Have trouble breathing. Have skin or nails that turn a bluish color. Have very bad pain or stiffness in your neck. Get a sudden headache. Get sudden pain in your face or ear. Cannot eat or drink without throwing up. These symptoms may represent a serious problem that is an emergency. Get medical help right away. Call your local emergency  services (911 in the [place] Do not wait to see if the symptoms will go away. Do not drive yourself to the hospital. Summary Influenza is also called the flu. It is an infection in the lungs, nose, and throat. It spreads easily from person to person. Take over-the-counter and prescription medicines only as told by your doctor. Getting a flu shot every year is the best way to not get the flu. This information is not intended to replace advice given to you by your health care provider. Make sure you discuss any questions you have with your health care provider. Document Revised: 08/21/2019 Document Reviewed: 08/21/2019 Elsevier Patient Education  2023 Elsevier Inc.     If you have been instructed to have an in-person evaluation today at a local Urgent Care facility, please use the link below. It will take you to a list of all of our available [facility], including address, phone number and hours of operation. Please do not delay care.  [facility]  If you or a family member do not have a primary care provider, use the link below to schedule a visit and establish care. When you choose a [facility] primary care physician or advanced practice provider, you gain a long-term partner in health. Find a Primary Care Provider  Learn more about [facility]'s in-office and virtual care options: [facility] Get Care Now  "

## 2024-01-16 NOTE — Progress Notes (Signed)
 " Virtual Visit Consent   Samantha Blackburn, you are scheduled for a virtual visit with a  provider today. Just as with appointments in the office, your consent must be obtained to participate. Your consent will be active for this visit and any virtual visit you may have with one of our providers in the next 365 days. If you have a MyChart account, a copy of this consent can be sent to you electronically.  As this is a virtual visit, video technology does not allow for your provider to perform a traditional examination. This may limit your provider's ability to fully assess your condition. If your provider identifies any concerns that need to be evaluated in person or the need to arrange testing (such as labs, EKG, etc.), we will make arrangements to do so. Although advances in technology are sophisticated, we cannot ensure that it will always work on either your end or our end. If the connection with a video visit is poor, the visit may have to be switched to a telephone visit. With either a video or telephone visit, we are not always able to ensure that we have a secure connection.  By engaging in this virtual visit, you consent to the provision of healthcare and authorize for your insurance to be billed (if applicable) for the services provided during this visit. Depending on your insurance coverage, you may receive a charge related to this service.  I need to obtain your verbal consent now. Are you willing to proceed with your visit today? Samantha Blackburn has provided verbal consent on 01/16/2024 for a virtual visit (video or telephone). Samantha Blackburn, [place]  Date: 01/16/2024 8:52 AM   Virtual Visit via Video Note   I, Samantha Blackburn, connected with  Samantha Blackburn  ([id number], 22-Sep-1980) on 01/16/2024 at  9:00 AM EST by a video-enabled telemedicine application and verified that I am speaking with the correct person using two identifiers.  Location: Patient: Virtual Visit  Location Patient: Home Provider: Virtual Visit Location Provider: Home Office   I discussed the limitations of evaluation and management by telemedicine and the availability of in person appointments. The patient expressed understanding and agreed to proceed.    History of Present Illness: Samantha Blackburn is a 44 y.o. who identifies as a female who was assigned female at birth, and is being seen today for < 48 hours of nasal congestion, rhinorrhea with subsequent development of severe headache, cough, fatigue and chills. 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 Denies chest pain or SOB.  OTC -- Ibuprofen   HPI: HPI  Problems:  Patient Active Problem List   Diagnosis Date Noted   Subacute cough 12/06/2023   Right carotid bruit 08/13/2023   Cervical radiculopathy 06/04/2023   Hyperglycemia 04/01/2023   Acute pansinusitis 04/01/2023   Gastroesophageal reflux disease 04/01/2023   Orthostatic hypotension 04/01/2023   Syncope and collapse 03/30/2023   Heart palpitations 12/10/2022   Easy bruising 12/10/2022   Anxiety 02/26/2022   Encounter for annual general medical examination with abnormal findings in adult 11/19/2021   Hyperlipidemia LDL goal <70 05/10/2021   Neuropathy 05/10/2021   Need for Tdap vaccination 05/10/2021   Hyponatremia 05/10/2021   Trichomonas infection 03/27/2021   Renal abscess 10/12/2020   Diabetes mellitus type 2, insulin  dependent (HCC) 09/26/2020   Abnormal physical evaluation 05/26/2020   Uncontrolled type 2 diabetes mellitus with hyperglycemia (HCC) 05/09/2020   Hypertension  associated with diabetes (HCC) 05/09/2020   Disorder of thyroid  gland 02/21/2018   History of trichomoniasis 04/19/2016   Encounter to establish care 11/14/2015   Obesity 12/01/2012    Allergies: Allergies[1] Medications: Current Medications[2]  Observations/Objective: Patient is well-developed, well-nourished in no acute  distress.  Resting comfortably at home.  Head is normocephalic, atraumatic.  No labored breathing. Speech is clear and coherent with logical content.  Patient is alert and oriented at baseline.   Assessment and Plan: 1. Influenza A (Primary) - oseltamivir (TAMIFLU) 75 MG capsule; Take 1 capsule (75 mg total) by mouth 2 (two) times daily for 5 days.  Dispense: 10 capsule; Refill: 0 - benzonatate  (TESSALON ) 100 MG capsule; Take 1 capsule (100 mg total) by mouth 2 (two) times daily as needed for cough.  Dispense: 20 capsule; Refill: 0  Negative COVID. Classic influenza symptoms. Positive Flu A. Supportive measures, OTC medications and Vitamin recommendations reviewed. Will start Tamiflu per orders. Tessalon  per orders. Quarantine reviewed with patient.    Follow Up Instructions: I discussed the assessment and treatment plan with the patient. The patient was provided an opportunity to ask questions and all were answered. The patient agreed with the plan and demonstrated an understanding of the instructions.  A copy of instructions were sent to the patient via MyChart unless otherwise noted below.   The patient was advised to call back or seek an in-person evaluation if the symptoms worsen or if the condition fails to improve as anticipated.    Samantha Velma Lunger, PA-C    [1]  Allergies Allergen Reactions   Cymbalta [Duloxetine Hcl]     Can't take it   Morphine Other (See Comments)    Makes her feel weird.  [2]  Current Outpatient Medications:    benzonatate  (TESSALON ) 100 MG capsule, Take 1 capsule (100 mg total) by mouth 2 (two) times daily as needed for cough., Disp: 20 capsule, Rfl: 0   oseltamivir (TAMIFLU) 75 MG capsule, Take 1 capsule (75 mg total) by mouth 2 (two) times daily for 5 days., Disp: 10 capsule, Rfl: 0   atorvastatin  (LIPITOR) 40 MG tablet, Take 1 tablet (40 mg total) by mouth daily., Disp: 90 tablet, Rfl: 3   gabapentin  (NEURONTIN ) 600 MG tablet, TAKE 2 TABLETS  (1,200 MG TOTAL) BY MOUTH 3 (THREE) TIMES DAILY., Disp: 180 tablet, Rfl: 3   hydrOXYzine  (VISTARIL ) 25 MG capsule, Take 1 capsule (25 mg total) by mouth every 8 (eight) hours as needed., Disp: 30 capsule, Rfl: 5   ibuprofen  (ADVIL ) 800 MG tablet, TAKE 1 TABLET BY MOUTH 3 (THREE) TIMES DAILY AS NEEDED FOR MODERATE PAIN (PAIN SCORE 4-6)., Disp: 90 tablet, Rfl: 2   insulin  aspart (NOVOLOG ) 100 UNIT/ML FlexPen, Inject 10 Units into the skin 3 (three) times daily with meals., Disp: 12 mL, Rfl: 5   methocarbamol  (ROBAXIN ) 750 MG tablet, Take 1 tablet (750 mg total) by mouth every 8 (eight) hours as needed for muscle spasms., Disp: 60 tablet, Rfl: 2   metoprolol  succinate (TOPROL  XL) 25 MG 24 hr tablet, Take 0.5 tablets (12.5 mg total) by mouth daily., Disp: 45 tablet, Rfl: 2   pantoprazole  (PROTONIX ) 40 MG tablet, Take 1 tablet (40 mg total) by mouth daily., Disp: 90 tablet, Rfl: 3   promethazine -dextromethorphan  (PROMETHAZINE -DM) 6.25-15 MG/5ML syrup, Take 5 mLs by mouth 4 (four) times daily as needed for cough., Disp: 118 mL, Rfl: 0   SLYND 4 MG TABS, Take 1 tablet by mouth daily., Disp: , Rfl:  tirzepatide  (MOUNJARO ) 12.5 MG/0.5ML Pen, Inject 12.5 mg into the skin once a week., Disp: 6 mL, Rfl: 2  "

## 2024-01-17 ENCOUNTER — Other Ambulatory Visit: Payer: Self-pay

## 2024-02-06 ENCOUNTER — Ambulatory Visit: Admitting: Neurology

## 2024-02-06 DIAGNOSIS — G629 Polyneuropathy, unspecified: Secondary | ICD-10-CM

## 2024-02-06 DIAGNOSIS — R2681 Unsteadiness on feet: Secondary | ICD-10-CM | POA: Diagnosis not present

## 2024-02-06 DIAGNOSIS — G5602 Carpal tunnel syndrome, left upper limb: Secondary | ICD-10-CM

## 2024-02-06 NOTE — Procedures (Signed)
 " [facility]  [address] [place], [address]  [place], [place] [address] Tel: [phone] Fax: [phone] Test Date:  02/06/2024  Patient: Samantha Blackburn DOB: [birth year] Physician: Tonita Blanch, DO  Sex: Female Height: 5' 3 Ref Phys: Tonita Blanch, DO  ID#: [id number]   Technician:    History: This is a 44 year old female with diabetes mellitus referred for evaluation of generalized weakness and distal paresthesias.  NCV & EMG Findings: Extensive electrodiagnostic testing of the left upper and lower extremity shows:  Left median sensory response shows prolonged latency (4.0 ms) and reduced amplitude (7.3 V).  Left ulnar and radial sensory responses are within normal limits.  Left sural and superficial peroneal sensory responses are absent. Left median motor response shows prolonged latency (4.1 ms).  Left ulnar motor responses within normal limits.  Left peroneal motor responses reduced at the extensor digitorum brevis, and normal at the tibialis anterior.  Left tibial motor response shows reduced amplitude. Left tibial H reflex study shows prolonged latency.   Sparse chronic motor axon loss changes are seen affecting the muscles below the knee, without accompanying active denervation.    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 Left median neuropathy at or distal to the wrist, consistent with a clinical diagnosis of carpal tunnel syndrome.  Overall, these findings are moderate-to-severe in degree electrically. There is no evidence of a diffuse myopathy or cervical/lumbosacral radiculopathy.   ___________________________ Tonita Blanch, DO    Nerve Conduction Studies   Stim Site NR Peak (ms) Norm Peak (ms) O-P Amp (V) Norm O-P Amp  Left Median Anti Sensory (2nd Digit)  32 C  Wrist    *4.0 <3.4 *7.3 >20  Left Radial Anti Sensory (Base 1st Digit)  32 C  Wrist    1.5 <2.7 21.5 >18  Left Sup Peroneal Anti Sensory (Ant Lat  Mall)  32 C  12 cm *NR  <4.5  >5  Left Sural Anti Sensory (Lat Mall)  32 C  Calf *NR  <4.5  >5  Left Ulnar Anti Sensory (5th Digit)  32 C  Wrist    2.7 <3.1 15.9 >12     Stim Site NR Onset (ms) Norm Onset (ms) O-P Amp (mV) Norm O-P Amp Site1 Site2 Delta-0 (ms) Dist (cm) Vel (m/s) Norm Vel (m/s)  Left Median Motor (Abd Poll Brev)  32 C  Wrist    *4.1 <3.9 6.4 >6 Elbow Wrist 5.7 29.0 51 >50  Elbow    9.8  6.4         Left Peroneal Motor (Ext Dig Brev)  32 C  Ankle    3.6 <5.5 *1.9 >3 B Fib Ankle 7.9 35.0 44 >40  B Fib    11.5  1.6  Poplt B Fib 1.5 8.0 53 >40  Poplt    13.0  1.5         Left Peroneal TA Motor (Tib Ant)  32 C  Fib Head    3.4 <4.0 4.6 >4 Poplit Fib Head 1.7 8.0 47 >40  Poplit    5.1 <5.7 4.5         Left Tibial Motor (Abd Hall Brev)  32 C  Ankle    4.0 <6.0 *5.8 >8 Knee Ankle 9.4 40.0 43 >40  Knee    13.4  4.0         Left Ulnar Motor (Abd Dig Minimi)  32 C  Wrist    2.8 <3.1 7.1 >7 B Elbow Wrist 3.9  20.0 51 >50  B Elbow    6.7  6.6  A Elbow B Elbow 1.9 10.0 53 >50  A Elbow    8.6  6.4          Electromyography   Side Muscle Ins.Act Fibs Fasc Recrt Amp Dur Poly Activation Comment  Left 1stDorInt Nml Nml Nml Nml Nml Nml Nml Nml N/A  Left Abd Poll Brev Nml Nml Nml Nml Nml Nml Nml Nml N/A  Left PronatorTeres Nml Nml Nml Nml Nml Nml Nml Nml N/A  Left Biceps Nml Nml Nml Nml Nml Nml Nml Nml N/A  Left Triceps Nml Nml Nml Nml Nml Nml Nml Nml N/A  Left Deltoid Nml Nml Nml Nml Nml Nml Nml Nml N/A  Left AntTibialis Nml Nml Nml *1+ *1+ *1+ *1+ Nml N/A  Left Gastroc Nml Nml Nml *1+ *1+ *1+ *1+ Nml N/A  Left Flex Dig Long Nml Nml Nml *1+ *1+ *1+ *1+ Nml N/A  Left BicepsFemS Nml Nml Nml Nml Nml Nml Nml Nml N/A  Left RectFemoris Nml Nml Nml Nml Nml Nml Nml Nml N/A  Left GluteusMed Nml Nml Nml Nml Nml Nml Nml Nml N/A      Waveforms:                           "

## 2024-02-07 ENCOUNTER — Ambulatory Visit: Payer: Self-pay | Admitting: Neurology

## 2024-02-12 NOTE — Interval H&P Note (Signed)
 DAY OF SURGERY UPDATE    H&P reviewed. The patient was examined and there are no changes to the H&P.

## 2024-02-12 NOTE — Anesthesia Postprocedure Evaluation (Signed)
 Patient: Samantha Blackburn  Scheduled: Procedures: VAGINAL HYSTERECTOMY, FOR UTERUS 250 G OR LESS  Anesthesia Start Date/Time: 02/12/24 0732   Procedure: VAGINAL HYSTERECTOMY, FOR UTERUS 250 G OR LESS (Vagina )   Anesthesia type: general   Pre-op diagnosis: Dysfunctional Uterine Bleeding; Uterine Prolapse;  Abnormal PAP   Location: OR [facility] / OR [facility]   Surgeons: Olegario Malachi Piety, MD     Final Anesthesia Type: general  Anesthesia Staff: Anesthesiologist: Monsour, Victory Durand, DO      Type Details Placement Removal   ETT Placement Date: 02/12/24; Placement Time: 0740; Pre O2/Mask induction: Preoxygenated with 100% O2 by face mask; Mask ventilation: easy; Size: 7; Secured at: 21 cm; ETT Type: Oral; Cuffed: Cuffed; Insertion attempts: 1; View: I; Blade: Mil 2; Type of view: direct laryngoscopy; Intubation Trauma: Atraumatic Placement; Placement Assessment: Equal Bilateral Breath Sounds, Positive ETCO2; Placed By: Anesthesiologist; Removal Date: 02/12/24; Removal Time: 0908 02/12/24 0740 by Monsour, Victory Durand, DO 02/12/24 0908 by Monsour, Victory Durand, DO       Patient Location: OR [facility]  Last vitals:  Vitals Value Taken Time  BP 154/80 02/12/24 09:20  Temp 36.2 C (97.1 F) 02/12/24 09:10  Pulse 79 02/12/24 09:20  Resp 10 02/12/24 09:20  SpO2 95 % 02/12/24 09:20  SpO2 Pulse 78 02/12/24 09:20      Level of consciousness: alert and awake Post vitals: stable PONV Present? no Pain score: 0 Pain management: adequate Airway patency: patent Cardiovascular status: acceptable Respiratory status: acceptable Hydration status: acceptable    PACU PONV Meds: None  PACU Pain Meds: None  Anesthetic complications: There were no known notable events for this encounter.   _______________ Victory Durand Monsour, DO 02/12/24

## 2024-02-12 NOTE — Unmapped External Note (Signed)
 Preop diagnosis:   Dysfunctional uterine bleeding abnormal Pap smear uterine prolapse  Postop diagnosis:   Same, pathology pending  Operation performed:   Vaginal hysterectomy  Under general anesthesia administered by Dr. Roselie and assisted by Stephane and Carlyon the patient was prepped and draped in the routine lithotomy position.  An indwelling Foley catheter was placed and a weighted speculum revealed a cervix which was grasped with 2 single-tooth tenaculum I came down to the introitus.  The uterus was infiltrated circumferentially around the cervix with Pitressin and cautery was used to incise circumferentially around the cervix.  Then a combination of sharp and blunt dissection was utilized to enter the peritoneum posteriorly and eventually anteriorly she had considerable scar tissue anteriorly.  All major pedicles were doubly tied after the transverse ligaments were secured.  An estimated blood loss was 150 cc.  She tolerated the procedure well was taken to the PACU recovery room after the surgery was completed.  The vaginal vault was closed transversely with a combination of interrupted and continuous suture.  Adnexa had been inspected and found to be normal.  The entire procedure was performed by myself.  No additional surgeons no complications no transfusions no implants.  Specimen of uterus was sent to the pathology department for histologic assessment.

## 2024-02-13 NOTE — Discharge Summary (Signed)
 Samantha Blackburn is a 44 year old day 1 postop vaginal hysterectomy for dysfunctional bleeding abnormal Pap smear and uterine prolapse.  She initially had postop issues with her blood pressure being elevated which required labetalol and nifedipine.  Her vitals are stable.  36.7 pulse 90 blood pressure 126/60.  She has minimal discomfort no bleeding.  Her hemoglobin 8.2 previously 11.7.  She will return to the office in 1 week's time or reach out directly by text or phone with fever pain bleeding problems with bladder bowels or questions.

## 2024-04-03 ENCOUNTER — Ambulatory Visit
